# Patient Record
Sex: Female | Born: 1955 | Race: White | Hispanic: No | Marital: Single | Smoking: Former smoker
Health system: Southern US, Community
[De-identification: ages and names within clinical notes are randomized; demographics above are authoritative.]

## PROBLEM LIST (undated history)

## (undated) DIAGNOSIS — K648 Other hemorrhoids: Secondary | ICD-10-CM

## (undated) DIAGNOSIS — F329 Major depressive disorder, single episode, unspecified: Secondary | ICD-10-CM

## (undated) DIAGNOSIS — M1990 Unspecified osteoarthritis, unspecified site: Secondary | ICD-10-CM

## (undated) DIAGNOSIS — R112 Nausea with vomiting, unspecified: Secondary | ICD-10-CM

## (undated) DIAGNOSIS — E039 Hypothyroidism, unspecified: Secondary | ICD-10-CM

## (undated) DIAGNOSIS — G479 Sleep disorder, unspecified: Secondary | ICD-10-CM

## (undated) DIAGNOSIS — IMO0001 Reserved for inherently not codable concepts without codable children: Secondary | ICD-10-CM

## (undated) DIAGNOSIS — E669 Obesity, unspecified: Secondary | ICD-10-CM

## (undated) DIAGNOSIS — I10 Essential (primary) hypertension: Secondary | ICD-10-CM

## (undated) DIAGNOSIS — K635 Polyp of colon: Secondary | ICD-10-CM

## (undated) DIAGNOSIS — F32A Depression, unspecified: Secondary | ICD-10-CM

## (undated) DIAGNOSIS — E559 Vitamin D deficiency, unspecified: Secondary | ICD-10-CM

## (undated) DIAGNOSIS — Z9889 Other specified postprocedural states: Secondary | ICD-10-CM

## (undated) HISTORY — DX: Hypothyroidism, unspecified: E03.9

## (undated) HISTORY — DX: Sleep disorder, unspecified: G47.9

## (undated) HISTORY — DX: Essential (primary) hypertension: I10

## (undated) HISTORY — PX: OTHER SURGICAL HISTORY: SHX169

## (undated) HISTORY — DX: Polyp of colon: K63.5

## (undated) HISTORY — DX: Obesity, unspecified: E66.9

## (undated) HISTORY — DX: Vitamin D deficiency, unspecified: E55.9

## (undated) HISTORY — DX: Reserved for inherently not codable concepts without codable children: IMO0001

## (undated) HISTORY — DX: Other hemorrhoids: K64.8

---

## 1995-07-20 DIAGNOSIS — L732 Hidradenitis suppurativa: Secondary | ICD-10-CM

## 1995-07-20 HISTORY — DX: Hidradenitis suppurativa: L73.2

## 1996-11-19 HISTORY — PX: ABDOMINAL HYSTERECTOMY: SHX81

## 1996-11-19 HISTORY — PX: TOTAL VAGINAL HYSTERECTOMY: SHX2548

## 1999-03-26 ENCOUNTER — Encounter: Admission: RE | Admit: 1999-03-26 | Discharge: 1999-03-26 | Payer: Self-pay | Admitting: Family Medicine

## 1999-03-26 ENCOUNTER — Encounter: Payer: Self-pay | Admitting: Family Medicine

## 2000-09-02 ENCOUNTER — Encounter: Payer: Self-pay | Admitting: Family Medicine

## 2000-09-02 ENCOUNTER — Encounter: Admission: RE | Admit: 2000-09-02 | Discharge: 2000-09-02 | Payer: Self-pay | Admitting: Family Medicine

## 2000-10-05 ENCOUNTER — Other Ambulatory Visit: Admission: RE | Admit: 2000-10-05 | Discharge: 2000-10-05 | Payer: Self-pay | Admitting: Obstetrics and Gynecology

## 2001-09-06 ENCOUNTER — Encounter: Admission: RE | Admit: 2001-09-06 | Discharge: 2001-09-06 | Payer: Self-pay | Admitting: Family Medicine

## 2001-09-06 ENCOUNTER — Encounter: Payer: Self-pay | Admitting: Family Medicine

## 2002-03-20 DIAGNOSIS — K648 Other hemorrhoids: Secondary | ICD-10-CM

## 2002-03-20 HISTORY — DX: Other hemorrhoids: K64.8

## 2003-01-23 ENCOUNTER — Encounter: Admission: RE | Admit: 2003-01-23 | Discharge: 2003-01-23 | Payer: Self-pay | Admitting: Family Medicine

## 2003-10-29 ENCOUNTER — Encounter: Admission: RE | Admit: 2003-10-29 | Discharge: 2003-10-29 | Payer: Self-pay | Admitting: Family Medicine

## 2004-04-08 ENCOUNTER — Encounter: Admission: RE | Admit: 2004-04-08 | Discharge: 2004-04-08 | Payer: Self-pay | Admitting: Family Medicine

## 2004-05-13 ENCOUNTER — Other Ambulatory Visit: Admission: RE | Admit: 2004-05-13 | Discharge: 2004-05-13 | Payer: Self-pay | Admitting: Family Medicine

## 2004-05-21 ENCOUNTER — Encounter: Admission: RE | Admit: 2004-05-21 | Discharge: 2004-05-21 | Payer: Self-pay | Admitting: Family Medicine

## 2004-06-04 DIAGNOSIS — G479 Sleep disorder, unspecified: Secondary | ICD-10-CM

## 2004-06-04 HISTORY — DX: Sleep disorder, unspecified: G47.9

## 2004-08-19 DIAGNOSIS — IMO0001 Reserved for inherently not codable concepts without codable children: Secondary | ICD-10-CM

## 2004-08-19 HISTORY — DX: Reserved for inherently not codable concepts without codable children: IMO0001

## 2005-05-13 ENCOUNTER — Encounter: Admission: RE | Admit: 2005-05-13 | Discharge: 2005-05-13 | Payer: Self-pay | Admitting: Family Medicine

## 2005-08-04 ENCOUNTER — Ambulatory Visit: Payer: Self-pay | Admitting: Family Medicine

## 2005-09-29 ENCOUNTER — Ambulatory Visit: Payer: Self-pay | Admitting: Family Medicine

## 2005-11-25 ENCOUNTER — Ambulatory Visit: Payer: Self-pay | Admitting: Family Medicine

## 2006-06-01 ENCOUNTER — Encounter: Admission: RE | Admit: 2006-06-01 | Discharge: 2006-06-01 | Payer: Self-pay | Admitting: Family Medicine

## 2006-06-02 ENCOUNTER — Ambulatory Visit: Payer: Self-pay | Admitting: Family Medicine

## 2007-05-03 ENCOUNTER — Other Ambulatory Visit: Admission: RE | Admit: 2007-05-03 | Discharge: 2007-05-03 | Payer: Self-pay | Admitting: Family Medicine

## 2007-05-03 ENCOUNTER — Ambulatory Visit: Payer: Self-pay | Admitting: Family Medicine

## 2007-06-02 ENCOUNTER — Encounter: Admission: RE | Admit: 2007-06-02 | Discharge: 2007-06-02 | Payer: Self-pay | Admitting: Family Medicine

## 2007-06-03 ENCOUNTER — Encounter: Admission: RE | Admit: 2007-06-03 | Discharge: 2007-06-03 | Payer: Self-pay | Admitting: Gastroenterology

## 2007-06-20 DIAGNOSIS — K635 Polyp of colon: Secondary | ICD-10-CM

## 2007-06-20 HISTORY — DX: Polyp of colon: K63.5

## 2007-07-08 LAB — HM COLONOSCOPY

## 2007-12-22 ENCOUNTER — Ambulatory Visit: Payer: Self-pay | Admitting: Family Medicine

## 2008-01-07 LAB — HM COLONOSCOPY: HM Colonoscopy: NORMAL

## 2008-06-28 ENCOUNTER — Ambulatory Visit: Payer: Self-pay | Admitting: Family Medicine

## 2008-08-06 ENCOUNTER — Encounter: Admission: RE | Admit: 2008-08-06 | Discharge: 2008-08-06 | Payer: Self-pay | Admitting: Family Medicine

## 2008-09-19 ENCOUNTER — Ambulatory Visit: Payer: Self-pay | Admitting: Family Medicine

## 2008-10-15 ENCOUNTER — Ambulatory Visit: Payer: Self-pay | Admitting: Family Medicine

## 2009-07-01 ENCOUNTER — Ambulatory Visit: Payer: Self-pay | Admitting: Physician Assistant

## 2010-07-13 ENCOUNTER — Other Ambulatory Visit: Payer: Self-pay | Admitting: Family Medicine

## 2010-07-14 ENCOUNTER — Telehealth: Payer: Self-pay | Admitting: *Deleted

## 2010-07-14 NOTE — Telephone Encounter (Signed)
Received rf request for lisinopril 10/12.5. Called in #30 and left message for patient that she must schedule appt for either CPE or med check in the next 30 days as she has not been seen since 07/01/2009.

## 2010-07-21 ENCOUNTER — Other Ambulatory Visit: Payer: Self-pay | Admitting: Family Medicine

## 2010-07-21 MED ORDER — BUPROPION HCL ER (XL) 300 MG PO TB24: 300.0000 mg | ORAL_TABLET | ORAL | Status: DC

## 2010-07-25 ENCOUNTER — Telehealth: Payer: Self-pay | Admitting: Family Medicine

## 2010-07-25 MED ORDER — SYNTHROID 75 MCG PO TABS: 75.0000 ug | ORAL_TABLET | Freq: Every day | ORAL | Status: DC

## 2010-07-25 NOTE — Telephone Encounter (Signed)
Pt made cpe on 09/11/10 which is first available.  Pt needs Synthroid .075 mcg 1 qd renewed to [facility]   [address]

## 2010-08-19 ENCOUNTER — Other Ambulatory Visit: Payer: Self-pay | Admitting: Family Medicine

## 2010-09-08 ENCOUNTER — Encounter: Payer: Self-pay | Admitting: Family Medicine

## 2010-09-09 ENCOUNTER — Other Ambulatory Visit: Payer: Self-pay | Admitting: Family Medicine

## 2010-09-09 DIAGNOSIS — Z1231 Encounter for screening mammogram for malignant neoplasm of breast: Secondary | ICD-10-CM

## 2010-09-11 ENCOUNTER — Ambulatory Visit (INDEPENDENT_AMBULATORY_CARE_PROVIDER_SITE_OTHER): Payer: BC Managed Care – PPO | Admitting: Family Medicine

## 2010-09-11 ENCOUNTER — Encounter: Payer: Self-pay | Admitting: Family Medicine

## 2010-09-11 VITALS — BP 106/68 | HR 68 | Ht 66.0 in | Wt 202.0 lb

## 2010-09-11 DIAGNOSIS — F325 Major depressive disorder, single episode, in full remission: Secondary | ICD-10-CM | POA: Insufficient documentation

## 2010-09-11 DIAGNOSIS — F329 Major depressive disorder, single episode, unspecified: Secondary | ICD-10-CM

## 2010-09-11 DIAGNOSIS — E039 Hypothyroidism, unspecified: Secondary | ICD-10-CM | POA: Insufficient documentation

## 2010-09-11 DIAGNOSIS — Z0000 Encounter for general adult medical examination without abnormal findings: Secondary | ICD-10-CM

## 2010-09-11 DIAGNOSIS — E559 Vitamin D deficiency, unspecified: Secondary | ICD-10-CM | POA: Insufficient documentation

## 2010-09-11 LAB — POCT URINALYSIS DIPSTICK
Bilirubin, UA: NEGATIVE
Blood, UA: NEGATIVE
Glucose, UA: NEGATIVE
Ketones, UA: NEGATIVE
Leukocytes, UA: NEGATIVE
Protein, UA: NEGATIVE
Urobilinogen, UA: NEGATIVE
pH, UA: 5

## 2010-09-11 LAB — GLUCOSE, RANDOM: Glucose, Bld: 78 mg/dL (ref 70–99)

## 2010-09-11 MED ORDER — BUPROPION HCL ER (XL) 150 MG PO TB24: 150.0000 mg | ORAL_TABLET | ORAL | Status: DC

## 2010-09-11 NOTE — Progress Notes (Signed)
[name] is a 55 y.o. female who presents for a complete physical.  She has the following concerns: Sometimes, when she leans forward, mouth fills with saliva. No indigestion, heartburn, nausea, or other associated problems.  Intermittent  Immunization History  Administered Date(s) Administered  . Tdap 09/19/2008  declines flu shots Last Pap smear: 2009; s/p hysterectomy (for benign bleeding) Last mammogram: scheduled for next week Last colonoscopy: 6/09 Dr. Loreta Ave Last DEXA: never Dentist: 2 years ago Ophtho:  Yearly Exercise: yardwork  Past Medical History  Diagnosis Date  . Unspecified hypothyroidism   . Obesity   . Menopause 2002  . Hyperlipidemia 04/2004  . Hemorrhoids, internal 03/2002  . Colon polyp 6/09    Dr. Loreta Ave (no path report in chart)    Past Surgical History  Procedure Date  . Abdominal hysterectomy 11/1996    1 ovary remains  . Cesarean section 04/08/81-02/02/90    X4  . Arthroscopic knee 08/18/10 Dr.Beane    right knee    History   Social History  . Marital Status: Divorced    Spouse Name: N/A    Number of Children: 4  . Years of Education: N/A   Occupational History  . [name], Customer service and sales rep    Social History Main Topics  . Smoking status: Former Smoker    Quit date: 01/20/1996  . Smokeless tobacco: Not on file  . Alcohol Use: Yes     1 drink twice a year.  . Drug Use: No  . Sexually Active: Not Currently   Other Topics Concern  . Not on file   Social History Narrative   Lives with 2 youngest children    Family History  Problem Relation Age of Onset  . Arthritis Father   . Hypertension Father   . Cancer Father     lung cancer  . Hypertension Mother     Current outpatient prescriptions:aspirin 81 MG tablet, Take 81 mg by mouth daily.  , Disp: , Rfl: ;  buPROPion (WELLBUTRIN XL) 150 MG 24 hr tablet, Take 1 tablet (150 mg total) by mouth every morning., Disp: 30 tablet, Rfl: 11;  HYDROcodone-acetaminophen  (NORCO) 7.5-325 MG per tablet, Take 1 tablet by mouth as needed.  , Disp: , Rfl: ;  ibuprofen (ADVIL,MOTRIN) 200 MG tablet, Take 800 mg by mouth as needed.  , Disp: , Rfl:  lisinopril-hydrochlorothiazide (PRINZIDE,ZESTORETIC) 10-12.5 MG per tablet, TAKE 1 TABLET EVERY DAY, Disp: 30 tablet, Rfl: 0;  methocarbamol (ROBAXIN) 750 MG tablet, Take 750 mg by mouth as needed.  , Disp: , Rfl: ;  SYNTHROID 75 MCG tablet, Take 1 tablet (75 mcg total) by mouth daily., Disp: 30 tablet, Rfl: 1  Allergies  Allergen Reactions  . Zinc Hives   ROS: The patient denies anorexia, fever, weight changes, headaches,  vision changes, decreased hearing, ear pain, sore throat, breast concerns, chest pain, palpitations, dizziness, syncope, dyspnea on exertion, cough, swelling, nausea, vomiting, diarrhea, constipation, abdominal pain, melena, hematochezia, indigestion/heartburn, hematuria, incontinence, dysuria, vaginal bleeding, discharge, odor or itch, genital lesions, tingling, weakness, tremor, suspicious skin lesions, depression, anxiety, abnormal bleeding/bruising, or enlarged lymph nodes.  Occasional numbness in fingers when she's doing her hair, intermittent.  Denies weakness.  Recent R knee surgery, pain from surgery.  On antidepressants since divorce 2-3 years ago.  PHYSICAL EXAM: BP 106/68  Pulse 68  Ht 5\' 6"  (1.676 m)  Wt 202 lb (91.627 kg)  BMI 32.60 kg/m2  General Appearance:    Alert, cooperative,  no distress, appears stated age  Head:    Normocephalic, without obvious abnormality, atraumatic  Eyes:    PERRL, conjunctiva/corneas clear, EOM's intact, fundi    benign  Ears:    Normal TM's and external ear canals  Nose:   Nares normal, mucosa normal, no drainage or sinus   tenderness  Throat:   Lips, mucosa, and tongue normal; teeth and gums normal  Neck:   Supple, no lymphadenopathy;  thyroid:  no   enlargement/tenderness/nodules; no carotid   bruit or JVD  Back:    Spine nontender, no curvature, ROM  normal, no CVA     tenderness  Lungs:     Clear to auscultation bilaterally without wheezes, rales or     ronchi; respirations unlabored  Chest Wall:    No tenderness or deformity   Heart:    Regular rate and rhythm, S1 and S2 normal, no murmur, rub   or gallop  Breast Exam:    No tenderness, masses, or nipple discharge or inversion.      No axillary lymphadenopathy  Abdomen:     Soft, non-tender, nondistended, normoactive bowel sounds,    no masses, no hepatosplenomegaly  Genitalia:    Normal external genitalia without lesions.  BUS and vagina normal; s/p hysterectomy. Normal bimanual exam--absent uterus.  Adnexa not palpable, no masses  Rectal:    Normal tone, no masses or tenderness; guaiac negative stool  Extremities:   No clubbing, cyanosis or edema  Pulses:   2+ and symmetric all extremities  Skin:   Skin color, texture, turgor normal, no rashes or lesions  Lymph nodes:   Cervical, supraclavicular, and axillary nodes normal  Neurologic:   CNII-XII intact, normal strength, sensation and gait; reflexes 2+ and symmetric throughout          Psych:   Normal mood, affect, hygiene and grooming.    ASSESSMENT/PLAN: 1. Routine general medical examination at a health care facility  POCT Urinalysis Dipstick, Visual acuity screening, Glucose, random  2. Depressive disorder, not elsewhere classified  buPROPion (WELLBUTRIN XL) 150 MG 24 hr tablet   Recommended trial decrease to 150mg , and if doing well in 1 month, consider discontinuing entirely.  Re-start (or increase back to 300mg ) if recurrent sx  3. Unspecified vitamin D deficiency  Vitamin D 25 hydroxy  4. Unspecified hypothyroidism  TSH

## 2010-09-11 NOTE — Patient Instructions (Signed)
Please call if you start having recurrent depressive symptoms, and we will change your Wellbutrin prescription back to 300mg   HEALTH MAINTENANCE RECOMMENDATIONS:  It is recommended that you get at least 30 minutes of aerobic exercise at least 5 days/week (for weight loss, you may need as much as 60-90 minutes). This can be any activity that gets your heart rate up. This can be divided in 10-15 minute intervals if needed, but try and build up your endurance at least once a week.  Weight bearing exercise is also recommended twice weekly.  Eat a healthy diet with lots of vegetables, fruits and fiber.  "Colorful" foods have a lot of vitamins (ie green vegetables, tomatoes, red peppers, etc).  Limit sweet tea, regular sodas and alcoholic beverages, all of which has a lot of calories and sugar.  Up to 1 alcoholic drink daily may be beneficial for women (unless trying to lose weight, watch sugars).  Drink a lot of water.  Calcium recommendations are 1200-1500 mg daily (1500 mg for postmenopausal women or women without ovaries), and vitamin D 1000 IU daily.  This should be obtained from diet and/or supplements (vitamins), and calcium should not be taken all at once, but in divided doses.  Monthly self breast exams and yearly mammograms for women over the age of 39 is recommended.  Sunscreen of at least SPF 30 should be used on all sun-exposed parts of the skin when outside between the hours of 10 am and 4 pm (not just when at beach or pool, but even with exercise, golf, tennis, and yard work!)  Use a sunscreen that says "broad spectrum" so it covers both UVA and UVB rays, and make sure to reapply every 1-2 hours.  Remember to change the batteries in your smoke detectors when changing your clock times in the spring and fall.  Use your seat belt every time you are in a car, and please drive safely and not be distracted with cell phones and texting while driving.  Annual flu shots are recommended for people  over 50

## 2010-09-12 ENCOUNTER — Encounter: Payer: Self-pay | Admitting: Family Medicine

## 2010-09-17 ENCOUNTER — Ambulatory Visit
Admission: RE | Admit: 2010-09-17 | Discharge: 2010-09-17 | Disposition: A | Discharge status: Home / Self Care | Payer: BC Managed Care – PPO | Source: Ambulatory Visit | Attending: Family Medicine | Admitting: Family Medicine

## 2010-09-17 DIAGNOSIS — Z1231 Encounter for screening mammogram for malignant neoplasm of breast: Secondary | ICD-10-CM

## 2010-09-18 ENCOUNTER — Other Ambulatory Visit: Payer: Self-pay | Admitting: Family Medicine

## 2010-10-17 ENCOUNTER — Other Ambulatory Visit: Payer: Self-pay | Admitting: Family Medicine

## 2010-10-20 ENCOUNTER — Ambulatory Visit (HOSPITAL_BASED_OUTPATIENT_CLINIC_OR_DEPARTMENT_OTHER)
Admission: RE | Admit: 2010-10-20 | Discharge: 2010-10-20 | Disposition: A | Discharge status: Home / Self Care | Payer: BC Managed Care – PPO | Source: Ambulatory Visit | Attending: Specialist | Admitting: Specialist

## 2010-10-20 DIAGNOSIS — X58XXXA Exposure to other specified factors, initial encounter: Secondary | ICD-10-CM | POA: Insufficient documentation

## 2010-10-20 DIAGNOSIS — I10 Essential (primary) hypertension: Secondary | ICD-10-CM | POA: Insufficient documentation

## 2010-10-20 DIAGNOSIS — M2240 Chondromalacia patellae, unspecified knee: Secondary | ICD-10-CM | POA: Insufficient documentation

## 2010-10-20 DIAGNOSIS — R9431 Abnormal electrocardiogram [ECG] [EKG]: Secondary | ICD-10-CM | POA: Insufficient documentation

## 2010-10-20 DIAGNOSIS — M2340 Loose body in knee, unspecified knee: Secondary | ICD-10-CM | POA: Insufficient documentation

## 2010-10-20 DIAGNOSIS — IMO0002 Reserved for concepts with insufficient information to code with codable children: Secondary | ICD-10-CM | POA: Insufficient documentation

## 2010-10-20 DIAGNOSIS — Z79899 Other long term (current) drug therapy: Secondary | ICD-10-CM | POA: Insufficient documentation

## 2010-10-20 HISTORY — PX: OTHER SURGICAL HISTORY: SHX169

## 2010-10-20 LAB — POCT I-STAT, CHEM 8
BUN: 12 mg/dL (ref 6–23)
Calcium, Ion: 1.23 mmol/L (ref 1.12–1.32)
Chloride: 102 mEq/L (ref 96–112)
Creatinine, Ser: 0.9 mg/dL (ref 0.50–1.10)
Glucose, Bld: 95 mg/dL (ref 70–99)
HCT: 42 % (ref 36.0–46.0)
Hemoglobin: 14.3 g/dL (ref 12.0–15.0)
Potassium: 3.8 mEq/L (ref 3.5–5.1)
Sodium: 138 mEq/L (ref 135–145)
TCO2: 23 mmol/L (ref 0–100)

## 2010-10-22 NOTE — Op Note (Signed)
[name], [name]                  ACCOUNT NO.:  [id number]  MEDICAL RECORD NO.:  [id number]  LOCATION:                               FACILITY:  [facility]  PHYSICIAN:  Jene Every, M.D.    DATE OF BIRTH:  February 06, 1955  DATE OF PROCEDURE:  10/20/2010 DATE OF DISCHARGE:                              OPERATIVE REPORT   PREOPERATIVE DIAGNOSIS:  Medial meniscus tear of the left knee.  POSTOPERATIVE DIAGNOSES: 1. Medial meniscus tear of the left knee. 2. Grade 3 chondromalacia, medial femoral condyle, medial tibial     plateau, lateral femoral condyle, and patellofemoral joint. 3. Cartilaginous loose bodies.  PROCEDURE PERFORMED: 1. Left knee arthroscopy. 2. Partial medial meniscectomy. 3. Chondroplasty, medial femoral condyle, medial tibial plateau     patella, patellofemoral joint, lateral tibial plateau. 4. Evacuation of loose bodies with pituitary rongeur and lavage.  ANESTHESIA:  General.  ASSISTANT:  None.  BRIEF HISTORY:  55 year old with knee pain, locking, giving way.  MRI indicating meniscus tear medially and locking.  Also, degenerative changes indicated for arthroscopic debridement.  Risk and benefits discussed, including bleeding, infection, no change in symptoms, worsening symptoms, need for repeat debridement, DVT, PE, anesthetic complications, etc.  TECHNIQUE:  Patient in supine position after induction of adequate general anesthesia and 2 g of Kefzol, left lower extremity was prepped and draped in the usual sterile fashion.  A lateral parapatellar portal and superomedial parapatellar portal was fashioned with #11 blade and ingress cannula atraumatically placed.  Irrigant was utilized to insufflate the joint.  Under direct visualization, a medial parapatellar portal was fashioned with a #11 blade after localization with an 18 gauge needle sparing the medial meniscus.  There was extensive tearing of the medial meniscus.  It was extending the posterior half.  I  used the upbiting basket rongeur and performed a partial medial meniscectomy to a stable base, further contouring with a 4.2 Cuda shaver.  Remnant was stable to probe palpation, approximately 1/3rd of the posterior half was excised.  Loose cartilaginous debris was noted in the medial compartment secondary to extensive grade 3 and near grade 4 changes of femoral condyle.  We evacuated loose bodies with a shaver as well as the pituitary.  Performed a light chondroplasty of the femoral condyle and the tibial plateau.  The region was 2 x 2 cm.  Next, we examined the ACL that was unremarkable.  Lateral compartment of the extensive grade 3 changes of tibial plateau, grade 2 changes of femoral condyle, degenerative fraying of the meniscus, performed a light chondroplasty of the lateral tibial plateau, shaved the meniscus.  No loose bodies noted here.  The remnant was stable to probe palpation.  Suprapatellar pouch revealed some grade 3 changes of patellofemoral joint.  Light chondroplasty performed here.  There is normal patellofemoral tracking.  Gutters were unremarkable.  I reexamined all compartments.  No further pathology, amenable arthroscopic intervention, therefore I removed all instrumentation. Portals were closed with 4-0 nylon simple sutures, 0.25% Marcaine with epinephrine was infiltrated in the joint.  Wound was dressed sterilely. [place] without difficulty and transported to recovery room in satisfactory condition.  Patient tolerated the procedure.  No  complications.     Jene Every, M.D.     Cordelia Pen  D:  10/20/2010  T:  10/21/2010  Job:  [id number]  Electronically Signed by Jene Every M.D. on 10/22/2010 09:00:04 AM

## 2010-11-20 DIAGNOSIS — E559 Vitamin D deficiency, unspecified: Secondary | ICD-10-CM

## 2010-11-20 HISTORY — DX: Vitamin D deficiency, unspecified: E55.9

## 2010-11-27 ENCOUNTER — Other Ambulatory Visit: Payer: BC Managed Care – PPO

## 2010-11-27 DIAGNOSIS — E785 Hyperlipidemia, unspecified: Secondary | ICD-10-CM

## 2010-11-27 DIAGNOSIS — E039 Hypothyroidism, unspecified: Secondary | ICD-10-CM

## 2010-11-27 DIAGNOSIS — E559 Vitamin D deficiency, unspecified: Secondary | ICD-10-CM

## 2010-11-27 LAB — COMPREHENSIVE METABOLIC PANEL
ALT: 11 U/L (ref 0–35)
Alkaline Phosphatase: 85 U/L (ref 39–117)
CO2: 25 mEq/L (ref 19–32)
Chloride: 104 mEq/L (ref 96–112)
Glucose, Bld: 89 mg/dL (ref 70–99)
Potassium: 4.4 mEq/L (ref 3.5–5.3)
Total Protein: 6.9 g/dL (ref 6.0–8.3)

## 2010-11-27 LAB — TSH: TSH: 1.743 u[IU]/mL (ref 0.350–4.500)

## 2010-11-27 LAB — LIPID PANEL
HDL: 53 mg/dL (ref >39)
Total CHOL/HDL Ratio: 3.9 Ratio
VLDL: 35 mg/dL (ref 0–40)

## 2010-12-22 ENCOUNTER — Other Ambulatory Visit: Payer: Self-pay | Admitting: Family Medicine

## 2010-12-23 ENCOUNTER — Other Ambulatory Visit: Payer: Self-pay | Admitting: Medical

## 2010-12-23 ENCOUNTER — Telehealth: Payer: Self-pay | Admitting: Family Medicine

## 2010-12-23 MED ORDER — LISINOPRIL-HYDROCHLOROTHIAZIDE 10-12.5 MG PO TABS: 1.0000 | ORAL_TABLET | Freq: Every day | ORAL | Status: DC

## 2010-12-23 NOTE — Telephone Encounter (Signed)
LMOM NOTIFYING THE PATIENT THAT HER RX WAS SENT TO THE PHARMACY. CLS

## 2010-12-23 NOTE — Telephone Encounter (Signed)
rx sent

## 2011-02-18 ENCOUNTER — Other Ambulatory Visit: Payer: Self-pay | Admitting: *Deleted

## 2011-02-18 ENCOUNTER — Other Ambulatory Visit: Payer: Self-pay | Admitting: Family Medicine

## 2011-02-18 DIAGNOSIS — E039 Hypothyroidism, unspecified: Secondary | ICD-10-CM

## 2011-02-18 MED ORDER — SYNTHROID 75 MCG PO TABS: 75.0000 ug | ORAL_TABLET | Freq: Every day | ORAL | Status: DC

## 2011-02-18 NOTE — Telephone Encounter (Signed)
Is this ok?

## 2011-03-24 ENCOUNTER — Other Ambulatory Visit: Payer: Self-pay | Admitting: Family Medicine

## 2011-03-24 NOTE — Telephone Encounter (Signed)
PATIENT NEEDS TO SCHEDULE AN OV.

## 2011-04-22 ENCOUNTER — Other Ambulatory Visit: Payer: Self-pay | Admitting: Family Medicine

## 2011-06-18 ENCOUNTER — Other Ambulatory Visit: Payer: Self-pay | Admitting: Family Medicine

## 2011-08-18 ENCOUNTER — Other Ambulatory Visit: Payer: Self-pay | Admitting: Family Medicine

## 2011-08-19 NOTE — Telephone Encounter (Signed)
Rx refill on Wellbutrin.

## 2011-08-19 NOTE — Telephone Encounter (Signed)
Called patient and let her know that she is due for CPE after 09/11/11-she needs to call and schedule- I have given her 30 days worth of her meds.

## 2011-08-20 NOTE — Telephone Encounter (Signed)
She needs a followup appointment. Give her enough to cover her until she can be seen

## 2011-09-19 ENCOUNTER — Other Ambulatory Visit: Payer: Self-pay | Admitting: Family Medicine

## 2011-09-22 NOTE — Telephone Encounter (Signed)
Don't r but she needs a followup appointment

## 2011-09-22 NOTE — Telephone Encounter (Signed)
IS THIS OK 

## 2011-10-05 ENCOUNTER — Encounter: Payer: Self-pay | Admitting: Internal Medicine

## 2011-10-12 ENCOUNTER — Ambulatory Visit (INDEPENDENT_AMBULATORY_CARE_PROVIDER_SITE_OTHER): Payer: BC Managed Care – PPO | Admitting: Medical

## 2011-10-12 ENCOUNTER — Encounter: Payer: Self-pay | Admitting: Medical

## 2011-10-12 VITALS — BP 102/70 | HR 72 | Temp 98.4°F | Resp 16 | Ht 66.0 in | Wt 199.5 lb

## 2011-10-12 DIAGNOSIS — E559 Vitamin D deficiency, unspecified: Secondary | ICD-10-CM

## 2011-10-12 DIAGNOSIS — E785 Hyperlipidemia, unspecified: Secondary | ICD-10-CM

## 2011-10-12 DIAGNOSIS — E039 Hypothyroidism, unspecified: Secondary | ICD-10-CM

## 2011-10-12 DIAGNOSIS — M7710 Lateral epicondylitis, unspecified elbow: Secondary | ICD-10-CM

## 2011-10-12 DIAGNOSIS — Z0000 Encounter for general adult medical examination without abnormal findings: Secondary | ICD-10-CM

## 2011-10-12 DIAGNOSIS — Z23 Encounter for immunization: Secondary | ICD-10-CM

## 2011-10-12 DIAGNOSIS — R21 Rash and other nonspecific skin eruption: Secondary | ICD-10-CM

## 2011-10-12 DIAGNOSIS — I10 Essential (primary) hypertension: Secondary | ICD-10-CM

## 2011-10-12 LAB — CBC
HCT: 35.3 % — ABNORMAL LOW (ref 36.0–46.0)
MCH: 30.6 pg (ref 26.0–34.0)
MCHC: 35.4 g/dL (ref 30.0–36.0)
MCV: 86.5 fL (ref 78.0–100.0)
RDW: 13.5 % (ref 11.5–15.5)

## 2011-10-12 LAB — COMPREHENSIVE METABOLIC PANEL
ALT: 17 U/L (ref 0–35)
Alkaline Phosphatase: 80 U/L (ref 39–117)
CO2: 27 mEq/L (ref 19–32)
Creat: 0.75 mg/dL (ref 0.50–1.10)
Sodium: 138 mEq/L (ref 135–145)
Total Bilirubin: 0.4 mg/dL (ref 0.3–1.2)
Total Protein: 7.1 g/dL (ref 6.0–8.3)

## 2011-10-12 LAB — POCT URINALYSIS DIPSTICK
Glucose, UA: NEGATIVE
Nitrite, UA: NEGATIVE
Protein, UA: NEGATIVE
Urobilinogen, UA: NEGATIVE

## 2011-10-12 LAB — LIPID PANEL: LDL Cholesterol: 99 mg/dL (ref 0–99)

## 2011-10-12 MED ORDER — DESONIDE 0.05 % EX CREA: TOPICAL_CREAM | Freq: Two times a day (BID) | CUTANEOUS | Status: DC

## 2011-10-12 MED ORDER — LISINOPRIL-HYDROCHLOROTHIAZIDE 10-12.5 MG PO TABS: 1.0000 | ORAL_TABLET | Freq: Every day | ORAL | Status: DC

## 2011-10-12 MED ORDER — SYNTHROID 75 MCG PO TABS: 75.0000 ug | ORAL_TABLET | Freq: Every day | ORAL | Status: DC

## 2011-10-12 MED ORDER — BUPROPION HCL ER (XL) 300 MG PO TB24: 300.0000 mg | ORAL_TABLET | ORAL | Status: DC

## 2011-10-12 NOTE — Progress Notes (Signed)
[name] is a 56 y.o. female presents for a complete physical, needs biometric form completed for work.  Been in normal state of health.  Last Pap smear: 2009; s/p hysterectomy (for benign bleeding) Last mammogram: scheduled for this month Last colonoscopy: 6/09 Dr. Loreta Ave Last DEXA: never Dentist: yearly Ophtho:  Yearly Exercise: yardwork  She has 2 c/o.  She notes intermittent  Hx/o dermatitis in both armpits, was prescribed desonide cream in the past by dermatology, would like refill on this for periodic use.  She notes pain in her right elbow region.  Was lifting boxes a week or so ago and later that day started getting pain over right arm just above and below elbow on the outside.  This continues to bother her.  Here for recheck on chronic issues - hypothyroidism, hypertension, hyperlipidemia. She is compliant with medication for thyroid and BP.   Past Medical History  Diagnosis Date  . Unspecified hypothyroidism   . Obesity   . Menopause 2002  . Hyperlipidemia 04/2004  . Hemorrhoids, internal 03/2002  . Colon polyp 6/09    Dr. Loreta Ave (no path report in chart)  . Hypertension   . History of mammogram 08/2010    normal screening  . Vitamin d deficiency 11/2010  . Normal cardiac stress test 08/19/04    normal 2D echo and cardiolite perfusion study; Dr. Aleen Campi  . Sleep disturbance 06/04/2004    normal sleep study     Past Surgical History  Procedure Date  . Cesarean section 04/08/81-02/02/90    X4  . Arthroscopic knee 08/18/10 Dr.Beane    right knee   . Arthroscopic knee 10/2010    left; Dr. Shelle Iron  . Abdominal hysterectomy 11/1996    1 ovary remains; due to dysmenorrhea    History   Social History  . Marital Status: Divorced    Spouse Name: N/A    Number of Children: 4  . Years of Education: N/A   Occupational History  . [employer], Customer service and sales rep    Social History Main Topics  . Smoking status: Former Smoker    Quit date: 01/20/1996    . Smokeless tobacco: Not on file  . Alcohol Use: Yes     1 drink twice a year.  . Drug Use: No  . Sexually Active: Not Currently   Other Topics Concern  . Not on file   Social History Narrative   Lives with 2 youngest children; divorced; exercise - yardwork, mowing    Family History  Problem Relation Age of Onset  . Arthritis Father   . Hypertension Father   . Cancer Father     lung cancer  . Hypertension Mother   . Diabetes Neg Hx   . Heart disease Neg Hx   . Stroke Neg Hx     Current outpatient prescriptions:aspirin 81 MG tablet, Take 81 mg by mouth daily.  , Disp: , Rfl: ;  buPROPion (WELLBUTRIN XL) 300 MG 24 hr tablet, TAKE 1 TABLET BY MOUTH EVERY MORNING, Disp: 30 tablet, Rfl: 0;  HYDROcodone-acetaminophen (NORCO) 7.5-325 MG per tablet, Take 1 tablet by mouth as needed.  , Disp: , Rfl: ;  ibuprofen (ADVIL,MOTRIN) 200 MG tablet, Take 800 mg by mouth as needed.  , Disp: , Rfl:  lisinopril-hydrochlorothiazide (PRINZIDE,ZESTORETIC) 10-12.5 MG per tablet, Take 1 tablet by mouth daily., Disp: 30 tablet, Rfl: 2;  methocarbamol (ROBAXIN) 750 MG tablet, Take 750 mg by mouth as needed.  , Disp: , Rfl: ;  SYNTHROID 75 MCG tablet, TAKE 1 TABLET EVERY DAY, Disp: 30 tablet, Rfl: 0;  DISCONTD: buPROPion (WELLBUTRIN XL) 150 MG 24 hr tablet, Take 1 tablet (150 mg total) by mouth every morning., Disp: 30 tablet, Rfl: 11 DISCONTD: lisinopril-hydrochlorothiazide (PRINZIDE,ZESTORETIC) 10-12.5 MG per tablet, TAKE 1 TABLET BY MOUTH DAILY, Disp: 30 tablet, Rfl: 0;  DISCONTD: lisinopril-hydrochlorothiazide (PRINZIDE,ZESTORETIC) 10-12.5 MG per tablet, TAKE 1 TABLET BY MOUTH DAILY, Disp: 30 tablet, Rfl: 0;  DISCONTD: SYNTHROID 75 MCG tablet, TAKE 1 TABLET EVERY DAY, Disp: 30 tablet, Rfl: 0  Allergies  Allergen Reactions  . Zinc Hives     Review of Systems Constitutional: denies fever, chills, sweats, unexpected weight change, anorexia, fatigue Allergy: negative; denies recent sneezing, itching,  congestion Dermatology: denies changing moles, lumps, new worrisome lesions ENT: no runny nose, ear pain, sore throat, hoarseness, sinus pain, teeth pain, tinnitus, hearing loss, epistaxis Cardiology: denies chest pain, palpitations, edema, orthopnea, paroxysmal nocturnal dyspnea Respiratory: denies cough, shortness of breath, dyspnea on exertion, wheezing, hemoptysis Gastroenterology: denies abdominal pain, nausea, vomiting, diarrhea, constipation, blood in stool, changes in bowel movement, dysphagia Hematology: denies bleeding or bruising problems Ophthalmology: denies vision changes, eye redness, itching, discharge Urology: denies dysuria, difficulty urinating, hematuria, urinary frequency, urgency, incontinence Neurology: no headache, weakness, tingling, numbness, speech abnormality, memory loss, falls, dizziness Psychology: denies depressed mood, agitation, sleep problems   PHYSICAL EXAM: BP 102/70  Pulse 72  Temp 98.4 F (36.9 C) (Oral)  Resp 16  Wt 199 lb 8 oz (90.493 kg)  General Appearance:    Alert, cooperative, no distress, appears stated age  Head:    Normocephalic, without obvious abnormality, atraumatic  Eyes:    PERRL, conjunctiva/corneas clear, EOM's intact, fundi    benign  Ears:    Normal TM's and external ear canals  Nose:   Nares normal, mucosa normal, no drainage or sinus   tenderness  Throat:  left lower lip with 3mm round dark red uniform lesion unchanged x 3-4 years, otherwise lips, mucosa, and tongue normal; teeth and gums normal  Neck:   Supple, no lymphadenopathy;  thyroid:  no   enlargement/tenderness/nodules; no carotid   bruit or JVD  Back:    Spine nontender, no curvature, ROM normal, no CVA     tenderness  Lungs:     Clear to auscultation bilaterally without wheezes, rales or     rhonchi; respirations unlabored  Chest Wall:    No tenderness or deformity   Heart:    Regular rate and rhythm, S1 and S2 normal, no murmur, rub   or gallop  Breast Exam:     deferred  Abdomen:     Soft, non-tender, nondistended, normoactive bowel sounds,    no masses, no hepatosplenomegaly  Genitalia:    Deferred  Rectal:    deferred  Extremities:   No clubbing, cyanosis or edema  Pulses:   2+ and symmetric all extremities  Skin:   Solar keratosis of left temple region, otherwise skin color, texture, turgor normal, no rashes or lesions  Neurologic:   CNII-XII intact, normal strength, sensation and gait; reflexes 2+ and symmetric throughout          Psych:   Normal mood, affect, hygiene and grooming.    ASSESSMENT/PLAN: Encounter Diagnoses  Name Primary?  . Routine general medical examination at a health care facility Yes  . Hypothyroidism   . Essential hypertension, benign   . Need for influenza vaccination   . Vitamin d deficiency   . Hyperlipidemia   .  Lateral epicondylitis   . Rash     Physical exam - discussed healthy lifestyle, diet, exercise, preventative care, vaccinations, and addressed their concerns.  Handout given.  She will return her biometric form for completion.  Return at her convenience for breast/pelvic/rectal exam/gynecologic screening.  Hypothyroidism - labs today, compliant with medication.  HTN - controlled, compliant with medication.  Flu vaccine given, VIS and vaccine counseling given.  Vitamin D deficiency - advised she begin Vit D 1000 IU daily OTC.  She has not been taking Vit D.  Hyperlipidemia - currently not on medication.  Repeat fasting labs today.  Lateral epicondylitis - advised relative rest, ice, compression to the elbow with ACE or sleeve, and if not improving in 1-2 wk, call.  Rash - intermittent dermatitis, refill on Desonide cream for prn use.  RTC pending labs

## 2011-10-12 NOTE — Patient Instructions (Addendum)
Preventative Care for Adults - Female    Lateral epicondylitis - try ice twice daily for 3-5 days, rest of the arm, and you can use elbow sleeve or ACE wrap for compression.  If not improving in a week, let me know.  Begin Vitamin D 1000 IU daily for vitamin D deficiency.     MAINTAIN REGULAR HEALTH EXAMS:  A routine yearly physical is a good way to check in with your primary care provider about your health and preventive screening. It is also an opportunity to share updates about your health and any concerns you have, and receive a thorough all-over exam.   Most health insurance companies pay for at least some preventative services.  Check with your health plan for specific coverages.  WHAT PREVENTATIVE SERVICES DO WOMEN NEED?  Adult women should have their weight and blood pressure checked regularly.   Women age 54 and older should have their cholesterol levels checked regularly.  Women should be screened for cervical cancer with a Pap smear and pelvic exam beginning at either age 54, or 3 years after they become sexually activity.    Breast cancer screening generally begins at age 54 with a mammogram and breast exam by your primary care provider.    Beginning at age 16 and continuing to age 14, women should be screened for colorectal cancer.  Certain people may need continued testing until age 62.  Updating vaccinations is part of preventative care.  Vaccinations help protect against diseases such as the flu.  Osteoporosis is a disease in which the bones lose minerals and strength as we age. Women ages 54 and over should discuss this with their caregivers, as should women after menopause who have other risk factors.  Lab tests are generally done as part of preventative care to screen for anemia and blood disorders, to screen for problems with the kidneys and liver, to screen for bladder problems, to check blood sugar, and to check your cholesterol level.  Preventative services  generally include counseling about diet, exercise, avoiding tobacco, drugs, excessive alcohol consumption, and sexually transmitted infections.    GENERAL RECOMMENDATIONS FOR GOOD HEALTH:  Healthy diet:  Eat a variety of foods, including fruit, vegetables, animal or vegetable protein, such as meat, fish, chicken, and eggs, or beans, lentils, tofu, and grains, such as rice.  Drink plenty of water daily.  Decrease saturated fat in the diet, avoid lots of red meat, processed foods, sweets, fast foods, and fried foods.  Exercise:  Aerobic exercise helps maintain good heart health. At least 30-40 minutes of moderate-intensity exercise is recommended. For example, a brisk walk that increases your heart rate and breathing. This should be done on most days of the week.   Find a type of exercise or a variety of exercises that you enjoy so that it becomes a part of your daily life.  Examples are running, walking, swimming, water aerobics, and biking.  For motivation and support, explore group exercise such as aerobic class, spin class, Zumba, Yoga,or  martial arts, etc.    Set exercise goals for yourself, such as a certain weight goal, walk or run in a race such as a 5k walk/run.  Speak to your primary care provider about exercise goals.  Disease prevention:  If you smoke or chew tobacco, find out from your caregiver how to quit. It can literally save your life, no matter how long you have been a tobacco user. If you do not use tobacco, never begin.  Maintain a healthy diet and normal weight. Increased weight leads to problems with blood pressure and diabetes.   The Body Mass Index or BMI is a way of measuring how much of your body is fat. Having a BMI above 27 increases the risk of heart disease, diabetes, hypertension, stroke and other problems related to obesity. Your caregiver can help determine your BMI and based on it develop an exercise and dietary program to help you achieve or maintain this  important measurement at a healthful level.  High blood pressure causes heart and blood vessel problems.  Persistent high blood pressure should be treated with medicine if weight loss and exercise do not work.   Fat and cholesterol leaves deposits in your arteries that can block them. This causes heart disease and vessel disease elsewhere in your body.  If your cholesterol is found to be high, or if you have heart disease or certain other medical conditions, then you may need to have your cholesterol monitored frequently and be treated with medication.   Ask if you should have a cardiac stress test if your history suggests this. A stress test is a test done on a treadmill that looks for heart disease. This test can find disease prior to there being a problem.  Menopause can be associated with physical symptoms and risks. Hormone replacement therapy is available to decrease these. You should talk to your caregiver about whether starting or continuing to take hormones is right for you.   Osteoporosis is a disease in which the bones lose minerals and strength as we age. This can result in serious bone fractures. Risk of osteoporosis can be identified using a bone density scan. Women ages 54 and over should discuss this with their caregivers, as should women after menopause who have other risk factors. Ask your caregiver whether you should be taking a calcium supplement and Vitamin D, to reduce the rate of osteoporosis.   Avoid drinking alcohol in excess (more than two drinks per day).  Avoid use of street drugs. Do not share needles with anyone. Ask for professional help if you need assistance or instructions on stopping the use of alcohol, cigarettes, and/or drugs.  Brush your teeth twice a day with fluoride toothpaste, and floss once a day. Good oral hygiene prevents tooth decay and gum disease. The problems can be painful, unattractive, and can cause other health problems. Visit your dentist for a  routine oral and dental check up and preventive care every 6-12 months.   Look at your skin regularly.  Use a mirror to look at your back. Notify your caregivers of changes in moles, especially if there are changes in shapes, colors, a size larger than a pencil eraser, an irregular border, or development of new moles.  Safety:  Use seatbelts 100% of the time, whether driving or as a passenger.  Use safety devices such as hearing protection if you work in environments with loud noise or significant background noise.  Use safety glasses when doing any work that could send debris in to the eyes.  Use a helmet if you ride a bike or motorcycle.  Use appropriate safety gear for contact sports.  Talk to your caregiver about gun safety.  Use sunscreen with a SPF (or skin protection factor) of 15 or greater.  Lighter skinned people are at a greater risk of skin cancer. Don't forget to also wear sunglasses in order to protect your eyes from too much damaging sunlight. Damaging sunlight can accelerate cataract  formation.   Practice safe sex. Use condoms. Condoms are used for birth control and to help reduce the spread of sexually transmitted infections (or STIs).  Some of the STIs are gonorrhea (the clap), chlamydia, syphilis, trichomonas, herpes, HPV (human papilloma virus) and HIV (human immunodeficiency virus) which causes AIDS. The herpes, HIV and HPV are viral illnesses that have no cure. These can result in disability, cancer and death.   Keep carbon monoxide and smoke detectors in your home functioning at all times. Change the batteries every 6 months or use a model that plugs into the wall.   Vaccinations:  Stay up to date with your tetanus shots and other required immunizations. You should have a booster for tetanus every 10 years. Be sure to get your flu shot every year, since 5%-20% of the [place] population comes down with the flu. The flu vaccine changes each year, so being vaccinated once is not  enough. Get your shot in the fall, before the flu season peaks.   Other vaccines to consider:  Human Papilloma Virus or HPV causes cancer of the cervix, and other infections that can be transmitted from person to person. There is a vaccine for HPV, and females should get immunized between the ages of 47 and 19. It requires a series of 3 shots.   Pneumococcal vaccine to protect against certain types of pneumonia.  This is normally recommended for adults age 39 or older.  However, adults younger than 54 years old with certain underlying conditions such as diabetes, heart or lung disease should also receive the vaccine.  Shingles vaccine to protect against Varicella Zoster if you are older than age 65, or younger than 54 years old with certain underlying illness.  Hepatitis A vaccine to protect against a form of infection of the liver by a virus acquired from food.  Hepatitis B vaccine to protect against a form of infection of the liver by a virus acquired from blood or body fluids, particularly if you work in health care.  If you plan to travel internationally, check with your local health department for specific vaccination recommendations.  Cancer Screening:  Breast cancer screening is essential to preventive care for women. All women age 8 and older should perform a breast self-exam every month. At age 37 and older, women should have their caregiver complete a breast exam each year. Women at ages 63 and older should have a mammogram (x-ray film) of the breasts. Your caregiver can discuss how often you need mammograms.    Cervical cancer screening includes taking a Pap smear (sample of cells examined under a microscope) from the cervix (end of the uterus). It also includes testing for HPV (Human Papilloma Virus, which can cause cervical cancer). Screening and a pelvic exam should begin at age 48, or 3 years after a woman becomes sexually active. Screening should occur every year, with a Pap smear  but no HPV testing, up to age 40. After age 37, you should have a Pap smear every 3 years with HPV testing, if no HPV was found previously.   Most routine colon cancer screening begins at the age of 59. On a yearly basis, doctors may provide special easy to use take-home tests to check for hidden blood in the stool. Sigmoidoscopy or colonoscopy can detect the earliest forms of colon cancer and is life saving. These tests use a small camera at the end of a tube to directly examine the colon. Speak to your caregiver about this at  age 74, when routine screening begins (and is repeated every 5 years unless early forms of pre-cancerous polyps or small growths are found).

## 2011-10-13 ENCOUNTER — Encounter: Payer: Self-pay | Admitting: Medical

## 2011-10-13 LAB — TSH: TSH: 1.252 u[IU]/mL (ref 0.350–4.500)

## 2011-10-22 ENCOUNTER — Other Ambulatory Visit: Payer: Self-pay | Admitting: Family Medicine

## 2011-10-22 NOTE — Telephone Encounter (Signed)
Is this ok?

## 2011-10-22 NOTE — Telephone Encounter (Signed)
Saw [name] recently for CPE

## 2011-10-23 ENCOUNTER — Other Ambulatory Visit: Payer: Self-pay | Admitting: Medical

## 2011-10-23 MED ORDER — BUPROPION HCL ER (XL) 300 MG PO TB24: 300.0000 mg | ORAL_TABLET | ORAL | Status: DC

## 2011-10-23 NOTE — Telephone Encounter (Signed)
Verify that she is taking Wellbutrin?  I received refill request.  Is she taking this for depression.  Of all the things we discussed on her recent physical, I somehow didn't document why she was on Wellbutrin, but I wanted to be accurate and update chart accordingly before I send the refill.

## 2011-10-23 NOTE — Telephone Encounter (Signed)
Pt is taking 300 mg qd for depression

## 2011-10-26 NOTE — Telephone Encounter (Signed)
[name]

## 2012-02-17 ENCOUNTER — Other Ambulatory Visit: Payer: Self-pay | Admitting: Medical

## 2012-02-17 MED ORDER — BUPROPION HCL ER (XL) 300 MG PO TB24: 300.0000 mg | ORAL_TABLET | ORAL | Status: DC

## 2012-02-17 NOTE — Telephone Encounter (Signed)
pls call pt about the medication refill Wellbutrin.  I saw her in September for physical.  We deferred her breast/pelvic exam at that time.  I believe she is due for this to be updated including mammogram/pap, etc.  She can see me or Dr. Lynelle Doctor for this.  When I saw her for physical, we didn't really focus on the Wellbutrin.   How long has she been on this?    I sent refill, but have her return soon to f/u on this medication specifically as well as gynecological exam.

## 2012-02-17 NOTE — Telephone Encounter (Signed)
Is this okay to refill? 

## 2012-02-18 ENCOUNTER — Other Ambulatory Visit: Payer: Self-pay | Admitting: Medical

## 2012-02-19 NOTE — Telephone Encounter (Signed)
Is this okay to fill? 

## 2012-02-22 NOTE — Telephone Encounter (Signed)
I tried to call this patient a few times but no return call so I left a detailed message in referrence to her medication and setting up a follow up appointment. CLS

## 2012-04-18 ENCOUNTER — Other Ambulatory Visit: Payer: Self-pay

## 2012-04-18 DIAGNOSIS — Z1231 Encounter for screening mammogram for malignant neoplasm of breast: Secondary | ICD-10-CM

## 2012-04-19 ENCOUNTER — Other Ambulatory Visit: Payer: Self-pay | Admitting: Medical

## 2012-04-21 ENCOUNTER — Other Ambulatory Visit: Payer: Self-pay | Admitting: Medical

## 2012-05-12 ENCOUNTER — Encounter: Payer: Self-pay | Admitting: Family Medicine

## 2012-05-12 ENCOUNTER — Ambulatory Visit (INDEPENDENT_AMBULATORY_CARE_PROVIDER_SITE_OTHER): Payer: BC Managed Care – PPO | Admitting: Family Medicine

## 2012-05-12 ENCOUNTER — Ambulatory Visit
Admission: RE | Admit: 2012-05-12 | Discharge: 2012-05-12 | Disposition: A | Discharge status: Home / Self Care | Payer: BC Managed Care – PPO | Source: Ambulatory Visit

## 2012-05-12 VITALS — BP 108/72 | HR 72 | Ht 66.0 in | Wt 186.0 lb

## 2012-05-12 DIAGNOSIS — M25579 Pain in unspecified ankle and joints of unspecified foot: Secondary | ICD-10-CM

## 2012-05-12 DIAGNOSIS — M25571 Pain in right ankle and joints of right foot: Secondary | ICD-10-CM

## 2012-05-12 DIAGNOSIS — Z01419 Encounter for gynecological examination (general) (routine) without abnormal findings: Secondary | ICD-10-CM

## 2012-05-12 DIAGNOSIS — Z1231 Encounter for screening mammogram for malignant neoplasm of breast: Secondary | ICD-10-CM

## 2012-05-12 LAB — HM MAMMOGRAPHY: HM Mammogram: NORMAL

## 2012-05-12 NOTE — Progress Notes (Signed)
Chief Complaint  Patient presents with  . pap only    pap only   She is here for breast and pelvic exam, as this was not done with her physical per [facility].  Scheduled for mammogram later today.  Denies any breast concerns (no discharge, lumps, tenderness).  Noticed an itchy dry spot on the left breast yesterday.  She is s/p hysterectomy for benign reasons.  Still has 1 ovary remaining.  Denies any pelvic pain, vaginal discharge or bleeding.  Has some dryness.  Hot flashes have improved.  She complains of external vaginal itching for quite a while, intermittent, finds that she will scratch in her sleep  She is complaining of pain in her right ankle only when she wears unsupportive shoes.  She will notice pain, as well as swelling posterior to the lateral ankle.  Occurs only if she is on her feet a lot.  Doesn't have this problem if wearing sneakers or other supportive shoes, just with sandals, flip flops or if barefoot.  Denies any injury or change in activity.  Has been going on for a few months.  Past Medical History  Diagnosis Date  . Unspecified hypothyroidism   . Obesity   . Menopause 2002  . Hyperlipidemia 04/2004  . Hemorrhoids, internal 03/2002  . Colon polyp 6/09    Dr. Loreta Ave (no path report in chart)  . Hypertension   . History of mammogram 08/2010    normal screening  . Vitamin D deficiency 11/2010  . Normal cardiac stress test 08/19/04    normal 2D echo and cardiolite perfusion study; Dr. Aleen Campi  . Sleep disturbance 06/04/2004    normal sleep study   Past Surgical History  Procedure Laterality Date  . Cesarean section  04/08/81-02/02/90    X4  . Arthroscopic knee  08/18/10 Dr.Beane    right knee   . Arthroscopic knee  10/2010    left; Dr. Shelle Iron  . Abdominal hysterectomy  11/1996    1 ovary remains; due to dysmenorrhea   Current Outpatient Prescriptions on File Prior to Visit  Medication Sig Dispense Refill  . buPROPion (WELLBUTRIN XL) 300 MG 24 hr tablet TAKE 1  TABLET BY MOUTH EVERY MORNING  30 tablet  0  . buPROPion (WELLBUTRIN XL) 300 MG 24 hr tablet TAKE 1 TABLET EVERY MORNING  30 tablet  3  . buPROPion (WELLBUTRIN XL) 300 MG 24 hr tablet TAKE 1 TABLET (300 MG TOTAL) BY MOUTH EVERY MORNING.  30 tablet  1  . buPROPion (WELLBUTRIN XL) 300 MG 24 hr tablet TAKE 1 TABLET (300 MG TOTAL) BY MOUTH EVERY MORNING.  30 tablet  1  . desonide (DESOWEN) 0.05 % cream Apply topically 2 (two) times daily.  60 g  0  . ibuprofen (ADVIL,MOTRIN) 200 MG tablet Take 800 mg by mouth as needed.        Marland Kitchen lisinopril-hydrochlorothiazide (PRINZIDE,ZESTORETIC) 10-12.5 MG per tablet Take 1 tablet by mouth daily.  30 tablet  11  . lisinopril-hydrochlorothiazide (PRINZIDE,ZESTORETIC) 10-12.5 MG per tablet TAKE 1 TABLET EVERY DAY  30 tablet  1  . SYNTHROID 75 MCG tablet Take 1 tablet (75 mcg total) by mouth daily.  30 tablet  11  . SYNTHROID 75 MCG tablet TAKE 1 TABLET EVERY DAY  30 tablet  1  . HYDROcodone-acetaminophen (NORCO) 7.5-325 MG per tablet Take 1 tablet by mouth as needed.        . methocarbamol (ROBAXIN) 750 MG tablet Take 750 mg by mouth as needed.  No current facility-administered medications on file prior to visit.   Allergies  Allergen Reactions  . Zinc Hives   ROS:  Denies fevers, URI symptoms, cough, shortness of breath, chest pain, edema.  +rashes as above.  Denies GI or GU complaints (except as noted above).  Moods are good, no other concerns.  PHYSICAL EXAM: BP 108/72  Pulse 72  Ht 5\' 6"  (1.676 m)  Wt 186 lb (84.369 kg)  BMI 30.04 kg/m2 Well developed, pleasant female in no distress Breast exam: no nipple inversion, nipple drainage, dominant masses or axillary lymphadenopathy.  There is a small patchy of dry skin at the 9 o'clock position on the left areola (only present x 1 day per pt), and very mild erythema on the medial left breast (she had been rubbing area).  Otherwise skin intact  External genitalia with mild erythema on external labia  bilaterally, on external area.  No warmth, satellite lesions or tenderness. Otherwise BUS/vaginal are normal.  Bimanual exam is normal.  Uterus is surgically absent.  No adnexal masses or tenderness  Rectal exam:  Normal sphincter control.  Heme negative stool, no masses  R ankle--normal exam--nontender to palpation.  Area of discomfort is posterior to lateral malleolus.  Nontender, no soft tissue swelling.  Normal exam with good endpoints.  No pain with eversion, inversion, or dorsiflexion against resistance.  Only slight discomfort (and normal strength) with plantarflexion against resistance.  No clubbing, cyanosis or edema, normal pulses  Psych: normal mood, affect, hygiene and grooming Neuro: alert and oriented.  Cranial nerves grossly intact.  Normal strength, sensation, gait.     ASSESSMENT/PLAN:  Routine gynecological examination  Ankle pain, right  External vaginal itching--possibilities include contact dermatitis (allergic reaction--pantiliners, soaps, fabric softener, etc), prolonged exposure to moisture, so try and keep the area clean and dry, and sometimes related to atrophic changes (thinning due to being postmenopausal).  If things don't improve with changing products and keeping the area dry, you may use an over-the-counter hydrocortisone cream as needed (intermittent use only) for itching.  Return for re-evaluation if symptoms worsen.  Pap not indicated due to hysterectomy for benign reasons.  Yearly pelvic and breast exams recommended.  Itchy spot on left breast--only of 1 day duration.  Recommend use of moisturizer, and OTC hydrocortisone BID for a few days if needed.  R ankle pain--likely mild strain vs tendonitis, symptomatic only with unsupportive shoes.  Discussed proper shoewear, and use of NSAIDs prn.  Hemoccult kit given to pt

## 2012-05-12 NOTE — Patient Instructions (Signed)
External vaginal itching--possibilities include contact dermatitis (allergic reaction--pantiliners, soaps, fabric softener, etc), prolonged exposure to moisture, so try and keep the area clean and dry, and sometimes related to atrophic changes (thinning due to being postmenopausal).  If things don't improve with changing products and keeping the area dry, you may use an over-the-counter hydrocortisone cream as needed (intermittent use only) for itching.  Return for re-evaluation if symptoms worsen.  Continue with monthly breast exams as shown, yearly mammograms. Return the hemoccult cards.  Wear supportive shoes.  You may use ibuprofen or aleve periodically if needed for pain and swelling

## 2012-06-18 ENCOUNTER — Other Ambulatory Visit: Payer: Self-pay | Admitting: Medical

## 2012-08-17 ENCOUNTER — Other Ambulatory Visit: Payer: Self-pay | Admitting: Medical

## 2012-08-17 NOTE — Telephone Encounter (Signed)
Is it okay to fill these meds?

## 2012-10-19 ENCOUNTER — Other Ambulatory Visit: Payer: Self-pay | Admitting: Medical

## 2012-10-19 NOTE — Telephone Encounter (Signed)
Is this okay to refill? She was last seen in 04/2012

## 2012-11-30 ENCOUNTER — Telehealth: Payer: Self-pay | Admitting: Medical

## 2012-12-06 ENCOUNTER — Telehealth: Payer: Self-pay | Admitting: Medical

## 2012-12-06 NOTE — Telephone Encounter (Signed)
tsd  °

## 2012-12-17 ENCOUNTER — Other Ambulatory Visit: Payer: Self-pay | Admitting: Medical

## 2012-12-17 ENCOUNTER — Other Ambulatory Visit: Payer: Self-pay | Admitting: Family Medicine

## 2012-12-19 ENCOUNTER — Other Ambulatory Visit: Payer: Self-pay | Admitting: *Deleted

## 2012-12-19 ENCOUNTER — Telehealth: Payer: Self-pay | Admitting: *Deleted

## 2012-12-19 MED ORDER — LISINOPRIL-HYDROCHLOROTHIAZIDE 10-12.5 MG PO TABS: ORAL_TABLET | ORAL | Status: DC

## 2012-12-19 MED ORDER — BUPROPION HCL ER (XL) 300 MG PO TB24: ORAL_TABLET | ORAL | Status: DC

## 2012-12-19 NOTE — Telephone Encounter (Signed)
appt scheduled and call in meds x 1 month.

## 2012-12-19 NOTE — Telephone Encounter (Signed)
Please schedule med check. She also has HTN and BP meds will likely also need to be refilled.  Refill x 1 only, and set up med check

## 2012-12-19 NOTE — Telephone Encounter (Signed)
Schedule for 12/22

## 2012-12-19 NOTE — Telephone Encounter (Signed)
Pt has cpe in March with Dr. Lynelle Doctor.  Pt needs refill for Synthroid 75 mcg qd, Wellbutrin 300 mg qd, Lisinopril 10-12.5 mg qd to [facility]

## 2012-12-19 NOTE — Telephone Encounter (Signed)
Patient last seen 4/14, last TSH 9/13, has CPE scheduled with you for 04/12/13. What would you like to do with this? Come in for TSH? Schedule med check? Wait for CPE? Please advise, thanks.

## 2012-12-19 NOTE — Telephone Encounter (Signed)
She can also have 30 days worth of the other meds requested (lisinopril HCT and wellbutrin)

## 2012-12-19 NOTE — Telephone Encounter (Signed)
I spoke with patient to schedule her for med check, she states that the only days she could come in would be 12/22, 23 or 24th as she is on vacation. 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 I was looking at 12/15/~10:45am as you have an opening and she could not do it. Wants to know if you can work her in the week of Christmas? I could find a spot her her.

## 2013-01-09 ENCOUNTER — Ambulatory Visit (INDEPENDENT_AMBULATORY_CARE_PROVIDER_SITE_OTHER): Payer: BC Managed Care – PPO | Admitting: Family Medicine

## 2013-01-09 ENCOUNTER — Encounter: Payer: Self-pay | Admitting: Family Medicine

## 2013-01-09 VITALS — BP 120/84 | HR 68 | Ht 66.0 in | Wt 203.0 lb

## 2013-01-09 DIAGNOSIS — F329 Major depressive disorder, single episode, unspecified: Secondary | ICD-10-CM

## 2013-01-09 DIAGNOSIS — Z23 Encounter for immunization: Secondary | ICD-10-CM

## 2013-01-09 DIAGNOSIS — I10 Essential (primary) hypertension: Secondary | ICD-10-CM

## 2013-01-09 DIAGNOSIS — E559 Vitamin D deficiency, unspecified: Secondary | ICD-10-CM

## 2013-01-09 DIAGNOSIS — E7800 Pure hypercholesterolemia, unspecified: Secondary | ICD-10-CM

## 2013-01-09 DIAGNOSIS — B351 Tinea unguium: Secondary | ICD-10-CM

## 2013-01-09 DIAGNOSIS — E039 Hypothyroidism, unspecified: Secondary | ICD-10-CM

## 2013-01-09 DIAGNOSIS — M79609 Pain in unspecified limb: Secondary | ICD-10-CM

## 2013-01-09 DIAGNOSIS — M79674 Pain in right toe(s): Secondary | ICD-10-CM

## 2013-01-09 LAB — COMPREHENSIVE METABOLIC PANEL
ALT: 12 U/L (ref 0–35)
AST: 12 U/L (ref 0–37)
Albumin: 4.6 g/dL (ref 3.5–5.2)
Alkaline Phosphatase: 82 U/L (ref 39–117)
Calcium: 9.5 mg/dL (ref 8.4–10.5)
Potassium: 4.3 mEq/L (ref 3.5–5.3)
Sodium: 139 mEq/L (ref 135–145)
Total Protein: 7.3 g/dL (ref 6.0–8.3)

## 2013-01-09 LAB — LIPID PANEL
HDL: 64 mg/dL (ref >39)
LDL Cholesterol: 110 mg/dL — ABNORMAL HIGH (ref 0–99)
VLDL: 24 mg/dL (ref 0–40)

## 2013-01-09 LAB — TSH: TSH: 1.295 u[IU]/mL (ref 0.350–4.500)

## 2013-01-09 MED ORDER — LISINOPRIL-HYDROCHLOROTHIAZIDE 10-12.5 MG PO TABS: 1.0000 | ORAL_TABLET | Freq: Every day | ORAL | Status: DC

## 2013-01-09 MED ORDER — BUPROPION HCL ER (XL) 300 MG PO TB24: ORAL_TABLET | ORAL | Status: DC

## 2013-01-09 NOTE — Progress Notes (Signed)
Chief Complaint  Patient presents with  . Hypothyroidism    fasting med check.   . Nail Problem    second toe on right foot she believes she has toenail fungus. Also right foot pinky toe is red and swollen-injured it over the summer-can you take a look at it today.    Right 2nd toenail is discolored, thickened. Denies pain.  She has been using topical OTC medications without benefit. Noticed it about 7 months ago.  Doesn't like how it looks.  She injured her right 5th toe in July.  It was purple, painful, couldn't wear shoes for a long time.  She no longer has pain, but notices that the toe looks different than her left pinkie toe.  It is redder and puffier (swollen) than her other toe, but it no longer hurts.  Hypothyroidism follow-up:  She has been taking her thyroid medication along with all of her other medications, as she always has.  She takes it an hour before food.  She has not missed any doses.  She takes a calcium+D supplement when she remembers, about 3-4x/week, but takes that in the evening.  She notes some weight gain, but admits to eating poorly recently.  She denies any significant changes in hair/skin/energy/bowels/moods.  Hypertension follow-up:  Blood pressures are not checked elsewhere.  Denies dizziness, headaches, chest pain, muscle cramps.  Denies side effects of medications.  Vitamin D deficiency:  She hasn't been taking a multivitamin or vitamin D supplement.  She is just taking the Calcium with D in the evenings when she remembers, about 3-4x/week.  Depression:  She was put on medication since her divorce, about 5 years ago.  She has never tried to come off.  She admits to having a lot going on in her life right now (daughter's husband left her).  Wondering if she should try and stop the anti-depressants.  Past Medical History  Diagnosis Date  . Unspecified hypothyroidism   . Obesity   . Menopause 2002  . Hyperlipidemia 04/2004  . Hemorrhoids, internal 03/2002  .  Colon polyp 6/09    Dr. Loreta Ave (no path report in chart)  . Hypertension   . History of mammogram 08/2010    normal screening  . Vitamin D deficiency 11/2010  . Normal cardiac stress test 08/19/04    normal 2D echo and cardiolite perfusion study; Dr. Aleen Campi  . Sleep disturbance 06/04/2004    normal sleep study   Past Surgical History  Procedure Laterality Date  . Cesarean section  04/08/81-02/02/90    X4  . Arthroscopic knee  08/18/10 Dr.Beane    right knee   . Arthroscopic knee  10/2010    left; Dr. Shelle Iron  . Abdominal hysterectomy  11/1996    1 ovary remains; due to dysmenorrhea   History   Social History  . Marital Status: Divorced    Spouse Name: N/A    Number of Children: 4  . Years of Education: N/A   Occupational History  . [employer], Customer service and sales rep    Social History Main Topics  . Smoking status: Former Smoker    Quit date: 01/20/1996  . Smokeless tobacco: Not on file  . Alcohol Use: Yes     Comment: 1 drink twice a year.  . Drug Use: No  . Sexual Activity: Not Currently   Other Topics Concern  . Not on file   Social History Narrative   Lives with 2 youngest children; divorced; exercise -  yardwork, mowing   Current outpatient prescriptions:aspirin 325 MG tablet, Take 325 mg by mouth daily., Disp: , Rfl: ;  buPROPion (WELLBUTRIN XL) 300 MG 24 hr tablet, TAKE 1 TABLET BY MOUTH EVERY MORNING, Disp: 30 tablet, Rfl: 0;  ibuprofen (ADVIL,MOTRIN) 200 MG tablet, Take 800 mg by mouth as needed.  , Disp: , Rfl: ;  lisinopril-hydrochlorothiazide (PRINZIDE,ZESTORETIC) 10-12.5 MG per tablet, Take 1 tablet by mouth daily., Disp: 30 tablet, Rfl: 11 SYNTHROID 75 MCG tablet, Take 1 tablet (75 mcg total) by mouth daily., Disp: 30 tablet, Rfl: 11;  desonide (DESOWEN) 0.05 % cream, Apply topically 2 (two) times daily., Disp: 60 g, Rfl: 0  Allergies  Allergen Reactions  . Zinc Hives   ROS:  Denies headaches, dizziness, chest pain, palpitations, GI  complaints, GU complaints, rashes, URI concerns, joint pains, or other concerns except as noted in HPI  PHYSICAL EXAM: BP 120/84  Pulse 68  Ht 5\' 6"  (1.676 m)  Wt 203 lb (92.08 kg)  BMI 32.78 kg/m2 Pleasant, obese female in no distress Neck: no lymphadenopathy, thyromegaly or carotid bruit Heart: regular rate and rhythm, no murmur Lungs: clear bilaterally Back: no CVA tenderness Abdomen: soft, nontender, no organomegaly or mass Extremities: no edema, 2+ pulse. Right 2nd toe--white discoloration and thickened. Right 5th toe is pink--mildly warm in comparison to other toes.  Nontender.  Skin is intact Neuro: alert and oriented, cranial nerves grossly intact. Normal gait, sensation, strength Psych: normal mood, affect, hygiene and grooming  ASSESSMENT/PLAN:  Unspecified hypothyroidism - euthyroid by history.  check TSH today.  reviewed proper way to take med. refill after labs back - Plan: TSH  Need for prophylactic vaccination and inoculation against influenza - Plan: Flu Vaccine QUAD 36+ mos IM  Unspecified vitamin D deficiency - recommended 1000 IU daily.  will advise of recommendations after labs back.  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 When stressors are less, consider decreasing to 150mg , and possibly taper off.  discuss at CPE in Spring - Plan: buPROPion (WELLBUTRIN XL) 300 MG 24 hr tablet  Essential hypertension, benign - well controlled - Plan: Comprehensive metabolic panel, lisinopril-hydrochlorothiazide (PRINZIDE,ZESTORETIC) 10-12.5 MG per tablet  Pure hypercholesterolemia - continue lowfat, low cholesterol diet - Plan: Lipid panel  Onychomycosis - asymptomatic/cosmetic.  will return for further eval if interested in treatment.  polishing nails recommended for cosmesis  Toe pain, right - suspect  possible fracture (distant history). given lack of pain, and normal circulation, reassured.  consider x-ray to confirm, if desired  Depression--consider trial of decreasing dose (with potential to try getting off medication entirely) in the Spring, when she thinks stressors will be less.  She has appt in March. Will discuss at that visit trying 150mg .  If she has recurrent symptoms on the lower dose, then will continue the 300mg  longterm.  If she stays on the 150mg  for 3 months without any problems, can try stopping the medication.  F/u as scheduled in March

## 2013-01-09 NOTE — Patient Instructions (Signed)
Return for discussion of fungal infection in toenails if it becomes painful, nail is ingrowing, or other concerns.  Try and get 1000 IU of Vitamin D every day.  We will be in touch with your results later this week.  Continue your current medications.

## 2013-01-10 LAB — VITAMIN D 25 HYDROXY (VIT D DEFICIENCY, FRACTURES): Vit D, 25-Hydroxy: 22 ng/mL — ABNORMAL LOW (ref 30–89)

## 2013-01-10 MED ORDER — SYNTHROID 75 MCG PO TABS: 75.0000 ug | ORAL_TABLET | Freq: Every day | ORAL | Status: DC

## 2013-01-16 MED ORDER — VITAMIN D (ERGOCALCIFEROL) 1.25 MG (50000 UNIT) PO CAPS: 50000.0000 [IU] | ORAL_CAPSULE | ORAL | Status: DC

## 2013-01-16 NOTE — Addendum Note (Signed)
Addended by: Barbette Or A on: 01/16/2013 11:53 AM   Modules accepted: Orders

## 2013-04-12 ENCOUNTER — Ambulatory Visit (INDEPENDENT_AMBULATORY_CARE_PROVIDER_SITE_OTHER): Payer: BC Managed Care – PPO | Admitting: Family Medicine

## 2013-04-12 ENCOUNTER — Encounter: Payer: Self-pay | Admitting: Family Medicine

## 2013-04-12 VITALS — BP 102/70 | HR 72 | Ht 66.0 in | Wt 207.0 lb

## 2013-04-12 DIAGNOSIS — F329 Major depressive disorder, single episode, unspecified: Secondary | ICD-10-CM

## 2013-04-12 DIAGNOSIS — I10 Essential (primary) hypertension: Secondary | ICD-10-CM

## 2013-04-12 DIAGNOSIS — E559 Vitamin D deficiency, unspecified: Secondary | ICD-10-CM

## 2013-04-12 DIAGNOSIS — E039 Hypothyroidism, unspecified: Secondary | ICD-10-CM

## 2013-04-12 DIAGNOSIS — F3289 Other specified depressive episodes: Secondary | ICD-10-CM

## 2013-04-12 DIAGNOSIS — Z0000 Encounter for general adult medical examination without abnormal findings: Secondary | ICD-10-CM

## 2013-04-12 DIAGNOSIS — E669 Obesity, unspecified: Secondary | ICD-10-CM

## 2013-04-12 LAB — POCT URINALYSIS DIPSTICK
Bilirubin, UA: NEGATIVE
Blood, UA: NEGATIVE
Glucose, UA: NEGATIVE
Ketones, UA: NEGATIVE
Leukocytes, UA: NEGATIVE
Nitrite, UA: NEGATIVE
Protein, UA: NEGATIVE
Spec Grav, UA: 1.01
Urobilinogen, UA: NEGATIVE
pH, UA: 6

## 2013-04-12 NOTE — Patient Instructions (Signed)
  HEALTH MAINTENANCE RECOMMENDATIONS:  It is recommended that you get at least 30 minutes of aerobic exercise at least 5 days/week (for weight loss, you may need as much as 60-90 minutes). This can be any activity that gets your heart rate up. This can be divided in 10-15 minute intervals if needed, but try and build up your endurance at least once a week.  Weight bearing exercise is also recommended twice weekly.  Eat a healthy diet with lots of vegetables, fruits and fiber.  "Colorful" foods have a lot of vitamins (ie green vegetables, tomatoes, red peppers, etc).  Limit sweet tea, regular sodas and alcoholic beverages, all of which has a lot of calories and sugar.  Up to 1 alcoholic drink daily may be beneficial for women (unless trying to lose weight, watch sugars).  Drink a lot of water.  Calcium recommendations are 1200-1500 mg daily (1500 mg for postmenopausal women or women without ovaries), and vitamin D 1000 IU daily.  This should be obtained from diet and/or supplements (vitamins), and calcium should not be taken all at once, but in divided doses.  Monthly self breast exams and yearly mammograms for women over the age of 56 is recommended.  Sunscreen of at least SPF 30 should be used on all sun-exposed parts of the skin when outside between the hours of 10 am and 4 pm (not just when at beach or pool, but even with exercise, golf, tennis, and yard work!)  Use a sunscreen that says "broad spectrum" so it covers both UVA and UVB rays, and make sure to reapply every 1-2 hours.  Remember to change the batteries in your smoke detectors when changing your clock times in the spring and fall.  Use your seat belt every time you are in a car, and please drive safely and not be distracted with cell phones and texting while driving.  Change your aspirin dose to 81mg  daily.  We should check Hepatitis C antibodies with your next blood draw (recommended for baby boomers).

## 2013-04-12 NOTE — Progress Notes (Signed)
Chief Complaint  Patient presents with  . Annual Exam    fasting annual exam, unsure if she needs pap or pelvic done-had pelvic exam done with Dr.Breon Diss 05/12/12. Did not do eye exam as she just had one last month with Dr.Byrnes. No concerns or complaints today.    [name] is a 58 y.o. female who presents for a complete physical.  She has no specific concerns, other than following up on her vitamin D deficiency--she took last pill last week.  Vitamin D deficiency:  She took the last of the 3 month prescription course of Vitamin D last week.  She has also been taking a Calcium with D supplement once daily.  Hypothyroidism follow-up: She has been taking her thyroid medication along with all of her other medications, as she always has. She takes it an hour before food. She has not missed any doses. She takes a calcium+D supplement every night. She denies any significant changes in hair/skin/energy/bowels/moods. Last TSH was 12/2012, normal at 1.295.  Hypertension follow-up: Blood pressures are not checked elsewhere. Denies dizziness, headaches, chest pain, muscle cramps. Denies side effects of medications.   Depression: She was put on medication since her divorce.  She has never tried to come off. She admits to still having stressors in her life (related to stressful job, and daughter's separation).    Immunization History  Administered Date(s) Administered  . Influenza Split 10/12/2011  . Influenza,inj,Quad PF,36+ Mos 01/09/2013  . Tdap 09/19/2008   Last Pap smear: last GYN exam 04/2012; pap n/a due to hysterectomy Last mammogram: 04/2012  Last colonoscopy: 12/2007 Last DEXA: never Dentist: once yearly Ophtho: once yearly, wears contacts Exercise:  Yardwork. She reports that exercise is limited by her knees  Past Medical History  Diagnosis Date  . Unspecified hypothyroidism   . Obesity   . Menopause 2002  . Hyperlipidemia 04/2004  . Hemorrhoids, internal 03/2002  . Colon polyp 6/09     Dr. Collene Mares (no path report in chart)  . Hypertension   . History of mammogram 08/2010    normal screening  . Vitamin D deficiency 11/2010  . Normal cardiac stress test 08/19/04    normal 2D echo and cardiolite perfusion study; Dr. West Bali  . Sleep disturbance 06/04/2004    normal sleep study    Past Surgical History  Procedure Laterality Date  . Cesarean section  04/08/81-02/02/90    X4  . Arthroscopic knee  08/18/10 Dr.Beane    right knee   . Arthroscopic knee  10/2010    left; Dr. Tonita Cong  . Abdominal hysterectomy  11/1996    1 ovary remains; due to dysmenorrhea    History   Social History  . Marital Status: Divorced    Spouse Name: N/A    Number of Children: 57  . Years of Education: N/A   Occupational History  . [employer] Caroli, Customer service and sales rep    Social History Main Topics  . Smoking status: Former Smoker    Quit date: 01/20/1996  . Smokeless tobacco: Never Used  . Alcohol Use: Yes     Comment: 1 drink twice a year.  . Drug Use: No  . Sexual Activity: Not Currently   Other Topics Concern  . Not on file   Social History Narrative   Lives with 1 youngest children; divorced; exercise - yardwork, mowing.  Children live in [place], [place] and [place]    Family History  Problem Relation Age of Onset  .  Arthritis Father   . Hypertension Father   . Cancer Father     lung cancer  . Hypertension Mother   . Diabetes Neg Hx   . Heart disease Neg Hx   . Stroke Neg Hx   . Hypothyroidism Daughter    Outpatient Encounter Prescriptions as of 04/12/2013  Medication Sig Note  . aspirin 325 MG tablet Take 325 mg by mouth daily.   Marland Kitchen buPROPion (WELLBUTRIN XL) 300 MG 24 hr tablet TAKE 1 TABLET BY MOUTH EVERY MORNING   . Calcium Carbonate-Vitamin D (CALCIUM-VITAMIN D) 500-200 MG-UNIT per tablet Take 1 tablet by mouth daily. 04/12/2013: Daily   . lisinopril-hydrochlorothiazide (PRINZIDE,ZESTORETIC) 10-12.5 MG per tablet Take 1 tablet by mouth daily.   Marland Kitchen  SYNTHROID 75 MCG tablet Take 1 tablet (75 mcg total) by mouth daily.   Marland Kitchen desonide (DESOWEN) 0.05 % cream Apply topically 2 (two) times daily. 04/12/2013: Uses prn eczema, rashes (rare)  . ibuprofen (ADVIL,MOTRIN) 200 MG tablet Take 800 mg by mouth as needed.     . [DISCONTINUED] Vitamin D, Ergocalciferol, (DRISDOL) 50000 UNITS CAPS capsule Take 1 capsule (50,000 Units total) by mouth every 7 (seven) days. 04/12/2013: Completed 12 week course last week     Allergies  Allergen Reactions  . Zinc Hives    ROS:  The patient denies anorexia, fever, headaches,  vision changes, decreased hearing, ear pain, sore throat, breast concerns, chest pain, palpitations, dizziness, syncope, dyspnea on exertion, cough, swelling, nausea, vomiting, diarrhea, constipation, abdominal pain, melena, hematochezia, indigestion/heartburn, hematuria, incontinence, dysuria, vaginal bleeding, discharge, odor or itch, genital lesions, numbness, tingling, weakness, tremor, suspicious skin lesions, depression, anxiety, abnormal bleeding/bruising, or enlarged lymph nodes.  20 pound weight gain in the last year, only 4 in the last 3 months.  She thinks this is related to stress, late night eating. She is alone a lot of the time, cooks for just herself (daughter who lives with her is engaged, not home much). Bilateral knee pain, no other joint pain.  Sees Dr. Tonita Cong, had cortisone shots in both knees in January, which has helped.   PHYSICAL EXAM: BP 102/70  Pulse 72  Ht 5' 6" (1.676 m)  Wt 207 lb (93.895 kg)  BMI 33.43 kg/m2  General Appearance:    Alert, cooperative, no distress, appears stated age  Head:    Normocephalic, without obvious abnormality, atraumatic  Eyes:    PERRL, conjunctiva/corneas clear, EOM's intact, fundi    benign  Ears:    Normal TM's and external ear canals  Nose:   Nares normal, mucosa normal, no drainage or sinus   tenderness  Throat:   Lips, mucosa, and tongue normal; teeth and gums normal  Neck:    Supple, no lymphadenopathy;  thyroid:  no   enlargement/tenderness/nodules; no carotid   bruit or JVD  Back:    Spine nontender, no curvature, ROM normal, no CVA     tenderness  Lungs:     Clear to auscultation bilaterally without wheezes, rales or     ronchi; respirations unlabored  Chest Wall:    No tenderness or deformity   Heart:    Regular rate and rhythm, S1 and S2 normal, no murmur, rub   or gallop  Breast Exam:    No tenderness, masses, or nipple discharge or inversion.      No axillary lymphadenopathy  Abdomen:     Soft, non-tender, nondistended, normoactive bowel sounds,    no masses, no hepatosplenomegaly  Genitalia:    Normal external  genitalia without lesions.  BUS and vagina normal; uterus is surgically absent; adnexa not enlarged, nontender, no masses.  Pap not performed  Rectal:    Normal tone, no masses or tenderness; guaiac negative stool  Extremities:   No clubbing, cyanosis or edema. Thickened, discolored right 2nd toenail, others are normal.  nontender  Pulses:   2+ and symmetric all extremities  Skin:   Skin color, texture, turgor normal, no rashes or lesions  Lymph nodes:   Cervical, supraclavicular, and axillary nodes normal  Neurologic:   CNII-XII intact, normal strength, sensation and gait; reflexes 2+ and symmetric throughout          Psych:   Normal mood, affect, hygiene and grooming.     ASSESSMENT/PLAN:  Routine general medical examination at a health care facility - Plan: POCT Urinalysis Dipstick  Unspecified hypothyroidism - controlled  Unspecified vitamin D deficiency - suboptimally replaced with OTC Vit D.  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  Add  separate vitamin D  HTN--well controlled Hypothyroidism--euthyroid by symptoms; last TSH 3 months ago was normal. Depression--when stressors are less, consider decreasing dose to 129m for a few months, and if still doing well, can consider stopping.  She will let [place] if/when she is ready to do this.  Discussed monthly self breast exams and yearly mammograms after the age of 413 at least 30 minutes of aerobic activity at least 5 days/week; proper sunscreen use reviewed; healthy diet, including goals of calcium and vitamin D intake and alcohol recommendations (less than or equal to 1 drink/day) reviewed; regular seatbelt use; changing batteries in smoke detectors.  Immunization recommendations discussed.  Colonoscopy recommendations reviewed--UTD, hemoccult kit given.  Recommended changing her aspirin from 325 to 840m  Risks and benefits reviewed.  F/u 6 months for fasting med check (all labs were normal, other than vitamin D, in December) Do Hep C Ab at that time (baby boomer, has never been screened; declined bloodwork today)

## 2013-11-20 ENCOUNTER — Encounter: Payer: Self-pay | Admitting: Family Medicine

## 2014-01-12 ENCOUNTER — Other Ambulatory Visit: Payer: Self-pay | Admitting: Family Medicine

## 2014-01-15 ENCOUNTER — Other Ambulatory Visit: Payer: Self-pay | Admitting: Family Medicine

## 2014-01-15 NOTE — Telephone Encounter (Signed)
Called and left message for pt to call back and schedule an appt since she was suppose to be seen 6 months later from her march appt and nothing has been scheduled yet. So when she has made an appt then i will refill her med for only a 30 day supply

## 2014-01-15 NOTE — Telephone Encounter (Signed)
Pt made an appt for jan so please fill meds. Pt is completely out.

## 2014-02-07 ENCOUNTER — Encounter: Payer: Self-pay | Admitting: Family Medicine

## 2014-02-07 ENCOUNTER — Ambulatory Visit (INDEPENDENT_AMBULATORY_CARE_PROVIDER_SITE_OTHER): Payer: BLUE CROSS/BLUE SHIELD | Admitting: Family Medicine

## 2014-02-07 VITALS — BP 110/76 | HR 64 | Ht 66.0 in | Wt 197.0 lb

## 2014-02-07 DIAGNOSIS — E039 Hypothyroidism, unspecified: Secondary | ICD-10-CM

## 2014-02-07 DIAGNOSIS — Z23 Encounter for immunization: Secondary | ICD-10-CM

## 2014-02-07 DIAGNOSIS — F324 Major depressive disorder, single episode, in partial remission: Secondary | ICD-10-CM

## 2014-02-07 DIAGNOSIS — F325 Major depressive disorder, single episode, in full remission: Secondary | ICD-10-CM

## 2014-02-07 DIAGNOSIS — E559 Vitamin D deficiency, unspecified: Secondary | ICD-10-CM

## 2014-02-07 DIAGNOSIS — Z5181 Encounter for therapeutic drug level monitoring: Secondary | ICD-10-CM

## 2014-02-07 DIAGNOSIS — I10 Essential (primary) hypertension: Secondary | ICD-10-CM

## 2014-02-07 LAB — CBC WITH DIFFERENTIAL/PLATELET
BASOS ABS: 0.1 10*3/uL (ref 0.0–0.1)
BASOS PCT: 1 % (ref 0–1)
Eosinophils Absolute: 0.2 10*3/uL (ref 0.0–0.7)
Eosinophils Relative: 2 % (ref 0–5)
HCT: 40.9 % (ref 36.0–46.0)
Hemoglobin: 13.9 g/dL (ref 12.0–15.0)
LYMPHS PCT: 20 % (ref 12–46)
Lymphs Abs: 1.8 10*3/uL (ref 0.7–4.0)
MCH: 30.2 pg (ref 26.0–34.0)
MCHC: 34 g/dL (ref 30.0–36.0)
MCV: 88.7 fL (ref 78.0–100.0)
MPV: 11.7 fL (ref 8.6–12.4)
Monocytes Absolute: 0.6 10*3/uL (ref 0.1–1.0)
Monocytes Relative: 7 % (ref 3–12)
NEUTROS PCT: 70 % (ref 43–77)
Neutro Abs: 6.3 10*3/uL (ref 1.7–7.7)
PLATELETS: 215 10*3/uL (ref 150–400)
RBC: 4.61 MIL/uL (ref 3.87–5.11)
RDW: 14.7 % (ref 11.5–15.5)
WBC: 9 10*3/uL (ref 4.0–10.5)

## 2014-02-07 LAB — COMPREHENSIVE METABOLIC PANEL
ALBUMIN: 4.8 g/dL (ref 3.5–5.2)
ALK PHOS: 88 U/L (ref 39–117)
ALT: 13 U/L (ref 0–35)
AST: 15 U/L (ref 0–37)
BUN: 12 mg/dL (ref 6–23)
CO2: 28 mEq/L (ref 19–32)
Calcium: 10.2 mg/dL (ref 8.4–10.5)
Chloride: 100 mEq/L (ref 96–112)
Creat: 0.83 mg/dL (ref 0.50–1.10)
Glucose, Bld: 97 mg/dL (ref 70–99)
POTASSIUM: 4.6 meq/L (ref 3.5–5.3)
Sodium: 138 mEq/L (ref 135–145)
TOTAL PROTEIN: 7.9 g/dL (ref 6.0–8.3)
Total Bilirubin: 0.4 mg/dL (ref 0.2–1.2)

## 2014-02-07 LAB — LIPID PANEL
CHOL/HDL RATIO: 2.8 ratio
Cholesterol: 187 mg/dL (ref 0–200)
HDL: 67 mg/dL (ref >39)
LDL CALC: 92 mg/dL (ref 0–99)
Triglycerides: 142 mg/dL (ref <150)
VLDL: 28 mg/dL (ref 0–40)

## 2014-02-07 LAB — TSH: TSH: 0.896 u[IU]/mL (ref 0.350–4.500)

## 2014-02-07 MED ORDER — LISINOPRIL-HYDROCHLOROTHIAZIDE 10-12.5 MG PO TABS: 1.0000 | ORAL_TABLET | Freq: Every day | ORAL | Status: DC

## 2014-02-07 MED ORDER — BUPROPION HCL ER (XL) 300 MG PO TB24: 300.0000 mg | ORAL_TABLET | Freq: Every morning | ORAL | Status: DC

## 2014-02-07 NOTE — Patient Instructions (Signed)
Blood pressure was great. We will be in touch with your lab results (via MyChart if you sign up, or by phone if you do not).  Good luck with your knee surgery!

## 2014-02-07 NOTE — Progress Notes (Signed)
Chief Complaint  Patient presents with  . Hypertension    fasting med check. Also brings in medical clearance form for right knee surgery.    Vitamin D deficiency: She has been inconsistent in taking both her calcium with D and her separate D.    Hypothyroidism follow-up: She has been taking her thyroid medication along with all of her other medications, as she always has. She takes it an hour before food. She has not missed any doses. She denies any significant changes in hair/skin/energy/bowels/moods.  Hypertension follow-up: Blood pressures are normal when checked at the orthopedist (runs 110-128/60's). Denies dizziness, headaches, chest pain, muscle cramps. Denies side effects of medications.   Depression: She was put on medication since her divorce. She has never tried to come off. She admits to still having stressors in her life (related to stressful job, and pain from her knee). Not interested in trying to lower dose of medication at this point, might consider after knee pain has resolved (plans on having both knees replaced).  She has been seeing Dr. Shelle Iron for knee pain.  Had arthroscopic surgery on both knees back in 2012, and she is now ready for knee replacements.  Plans to have the right done first, and probably the left later this year if all goes well with the right.  Exercise has been limited due to pain.  Takes 1/2 hydrocodone upon returning from work each day, plus ibuprofen in the morning and tylenol in the afternoon.  Past Medical History  Diagnosis Date  . Unspecified hypothyroidism   . Obesity   . Menopause 2002  . Hyperlipidemia 04/2004  . Hemorrhoids, internal 03/2002  . Colon polyp 6/09    Dr. Loreta Ave (no path report in chart)  . Hypertension   . History of mammogram 08/2010    normal screening  . Vitamin D deficiency 11/2010  . Normal cardiac stress test 08/19/04    normal 2D echo and cardiolite perfusion study; Dr. Aleen Campi  . Sleep disturbance 06/04/2004   normal sleep study   Past Surgical History  Procedure Laterality Date  . Cesarean section  04/08/81-02/02/90    X4  . Arthroscopic knee  08/18/10 Dr.Beane    right knee   . Arthroscopic knee  10/2010    left; Dr. Shelle Iron  . Abdominal hysterectomy  11/1996    1 ovary remains; due to dysmenorrhea   History   Social History  . Marital Status: Divorced    Spouse Name: N/A    Number of Children: 4  . Years of Education: N/A   Occupational History  . [employer], Customer service and sales rep    Social History Main Topics  . Smoking status: Former Smoker    Quit date: 01/20/1996  . Smokeless tobacco: Never Used  . Alcohol Use: Yes     Comment: 1 drink twice a year.  . Drug Use: No  . Sexual Activity: Not Currently   Other Topics Concern  . Not on file   Social History Narrative   Lives with 1 youngest children; divorced; exercise - yardwork, mowing.  Children live in [place], [place] and [place]   Family History  Problem Relation Age of Onset  . Arthritis Father   . Hypertension Father   . Cancer Father     lung cancer  . Hypertension Mother   . Diabetes Mother     borderline  . Heart disease Neg Hx   . Stroke Neg Hx   . Hypothyroidism Daughter  Outpatient Encounter Prescriptions as of 02/07/2014  Medication Sig Note  . acetaminophen (TYLENOL) 325 MG tablet Take 650 mg by mouth every 6 (six) hours as needed. 02/07/2014: Takes in the afternoons  . aspirin 81 MG tablet Take 81 mg by mouth daily.   Marland Kitchen. buPROPion (WELLBUTRIN XL) 300 MG 24 hr tablet TAKE 1 TABLET BY MOUTH EVERY MORNING   . HYDROcodone-acetaminophen (NORCO/VICODIN) 5-325 MG per tablet Take 1 tablet by mouth every 8 (eight) hours as needed. for pain 02/07/2014: From Dr. Shelle IronBeane.  Takes 1/2 tablet every evening after work  . ibuprofen (ADVIL,MOTRIN) 200 MG tablet Take 800 mg by mouth as needed.   02/07/2014: Every am  . lisinopril-hydrochlorothiazide (PRINZIDE,ZESTORETIC) 10-12.5 MG per tablet TAKE 1 TABLET BY  MOUTH DAILY.   . SYNTHROID 75 MCG tablet TAKE 1 TABLET BY MOUTH EVERY DAY   . Calcium Carbonate-Vitamin D (CALCIUM-VITAMIN D) 500-200 MG-UNIT per tablet Take 1 tablet by mouth daily. 02/07/2014: Takes sporadically  . desonide (DESOWEN) 0.05 % cream Apply topically 2 (two) times daily. (Patient not taking: Reported on 02/07/2014) 04/12/2013: Uses prn eczema, rashes (rare)  . [DISCONTINUED] aspirin 325 MG tablet Take 325 mg by mouth daily.    Allergies  Allergen Reactions  . Zinc Hives   ROS:  The patient denies anorexia, fever, weight changes, headaches,  vision changes, decreased hearing, ear pain, sore throat, breast concerns, chest pain, palpitations, dizziness, syncope, dyspnea on exertion, cough, swelling, nausea, vomiting, diarrhea, constipation, abdominal pain, melena, hematochezia, indigestion/heartburn, hematuria, incontinence, dysuria, vaginal discharge, odor or itch, genital lesions, numbness, tingling, weakness, tremor, suspicious skin lesions, depression, anxiety, abnormal bleeding/bruising, or enlarged lymph nodes. +bilateral knee pain  PHYSICAL EXAM: BP 110/76 mmHg  Pulse 64  Ht 5\' 6"  (1.676 m)  Wt 197 lb (89.359 kg)  BMI 31.81 kg/m2 Well developed, pleasant female in no distress HEENT: PERRL, EOMI, conjunctiva clear.  OP clear Neck: No lymphadenopathy, thyromegaly or carotid bruit Heart: regular rate and rhythm without murmur, rub, gallop Lungs: clear bilaterallly Back: no CVA tenderness Abdomen: soft, nontender, no organomegaly or mass, no bruit Extremities: no edema, 2+ pulses Skin: no suspicious lesions, rashes Neuro: alert and oriented.  Cranial nerves intact. Normal strength, sensation, gait Psych: normal mood, affect, hygiene and grooming  ASSESSMENT/PLAN:  Essential hypertension, benign - well controlled - Plan: Lipid panel, Comprehensive metabolic panel, lisinopril-hydrochlorothiazide (PRINZIDE,ZESTORETIC) 10-12.5 MG per tablet  Vitamin D deficiency - discussed  importance of daily supplementation, long-term  - Plan: Vit D  25 hydroxy (rtn osteoporosis monitoring)  Hypothyroidism, unspecified hypothyroidism type - euthyroid by history - Plan: TSH  Major depression in remission - continue medications given ongoing stressors.  consider trial at lower dose within the year (after knee pain improved s/p surgery) - Plan: buPROPion (WELLBUTRIN XL) 300 MG 24 hr tablet  Medication monitoring encounter - Plan: Comprehensive metabolic panel, CBC with Differential, Vit D  25 hydroxy (rtn osteoporosis monitoring), TSH   Flu shot today  FFO--cleared for knee surgery (assuming normal routine pre-op eval) Negative cardiac w/u in 2006. No family history, or any current complaints/symptoms to warrant concern.  Blood pressure is well controlled.  F/u 6 months for CPE (due in March, but can wait 6 months)

## 2014-02-08 ENCOUNTER — Other Ambulatory Visit: Payer: Self-pay | Admitting: Family Medicine

## 2014-02-08 LAB — VITAMIN D 25 HYDROXY (VIT D DEFICIENCY, FRACTURES): VIT D 25 HYDROXY: 15 ng/mL — AB (ref 30–100)

## 2014-02-08 NOTE — Addendum Note (Signed)
Addended by: Debbrah AlarSMITH, Shalandra Leu F on: 02/08/2014 08:16 AM   Modules accepted: Orders

## 2014-02-12 ENCOUNTER — Other Ambulatory Visit: Payer: Self-pay | Admitting: *Deleted

## 2014-02-12 MED ORDER — ERGOCALCIFEROL 1.25 MG (50000 UT) PO CAPS: 50000.0000 [IU] | ORAL_CAPSULE | ORAL | Status: DC

## 2014-02-15 ENCOUNTER — Ambulatory Visit: Payer: Self-pay | Admitting: Orthopedic Surgery

## 2014-02-26 NOTE — Patient Instructions (Addendum)
[name]  02/26/2014   Your procedure is scheduled on: 03/08/14   [facility] and follow signs to               Short Stay Center at       0530 AM.  Call this number if you have problems the morning of surgery [phone]   Remember:  Do not eat food or drink liquids :After Midnight.     Take these medicines the morning of surgery with A SIP OF WATER: wellbutrin, Synthroid                                You may not have any metal on your body including hair pins and              piercings  Do not wear jewelry, make-up, lotions, powders or perfumes., deodorant.               Do not wear nail polish.  Do not shave  48 hours prior to surgery.                Do not bring valuables to the hospital. [place] [facility] IS NOT             RESPONSIBLE   FOR VALUABLES.  Contacts, dentures or bridgework may not be worn into surgery.  Leave suitcase in the car. After surgery it may be brought to your room.         Special Instructions:coughing and deep breathing exercises, leg exercises               Please read over the following fact sheets you were given: _____________________________________________________________________             [facility] - Preparing for Surgery Before surgery, you can play an important role.  Because skin is not sterile, your skin needs to be as free of germs as possible.  You can reduce the number of germs on your skin by washing with CHG (chlorahexidine gluconate) soap before surgery.  CHG is an antiseptic cleaner which kills germs and bonds with the skin to continue killing germs even after washing. Please DO NOT use if you have an allergy to CHG or antibacterial soaps.  If your skin becomes reddened/irritated stop using the CHG and inform your nurse when you arrive at Short Stay. Do not shave (including legs and underarms) for at least 48 hours prior to the first CHG shower.  You may shave your face/neck. Please  follow these instructions carefully:  1.  Shower with CHG Soap the night before surgery and the  morning of Surgery.  2.  If you choose to wash your hair, wash your hair first as usual with your  normal  shampoo.  3.  After you shampoo, rinse your hair and body thoroughly to remove the  shampoo.                           4.  Use CHG as you would any other liquid soap.  You can apply chg directly  to the skin and wash                       Gently with a scrungie or clean washcloth.  5.  Apply the CHG Soap to your body ONLY FROM THE NECK DOWN.   Do not use on face/ open                           Wound or open sores. Avoid contact with eyes, ears mouth and genitals (private parts).                       Wash face,  Genitals (private parts) with your normal soap.             6.  Wash thoroughly, paying special attention to the area where your surgery  will be performed.  7.  Thoroughly rinse your body with warm water from the neck down.  8.  DO NOT shower/wash with your normal soap after using and rinsing off  the CHG Soap.                9.  Pat yourself dry with a clean towel.            10.  Wear clean pajamas.            11.  Place clean sheets on your bed the night of your first shower and do not  sleep with pets. Day of Surgery : Do not apply any lotions/deodorants the morning of surgery.  Please wear clean clothes to the hospital/surgery center.  FAILURE TO FOLLOW THESE INSTRUCTIONS MAY RESULT IN THE CANCELLATION OF YOUR SURGERY PATIENT SIGNATURE_________________________________  NURSE SIGNATURE__________________________________  ________________________________________________________________________  WHAT IS A BLOOD TRANSFUSION? Blood Transfusion Information  A transfusion is the replacement of blood or some of its parts. Blood is made up of multiple cells which provide different functions.  Red blood cells carry oxygen and are used for blood loss replacement.  White blood cells  fight against infection.  Platelets control bleeding.  Plasma helps clot blood.  Other blood products are available for specialized needs, such as hemophilia or other clotting disorders. BEFORE THE TRANSFUSION  Who gives blood for transfusions?   Healthy volunteers who are fully evaluated to make sure their blood is safe. This is blood bank blood. Transfusion therapy is the safest it has ever been in the practice of medicine. Before blood is taken from a donor, a complete history is taken to make sure that person has no history of diseases nor engages in risky social behavior (examples are intravenous drug use or sexual activity with multiple partners). The donor's travel history is screened to minimize risk of transmitting infections, such as malaria. The donated blood is tested for signs of infectious diseases, such as HIV and hepatitis. The blood is then tested to be sure it is compatible with you in order to minimize the chance of a transfusion reaction. If you or a relative donates blood, this is often done in anticipation of surgery and is not appropriate for emergency situations. It takes many days to process the donated blood. RISKS AND COMPLICATIONS Although transfusion therapy is very safe and saves many lives, the main dangers of transfusion include:  1. Getting an infectious disease. 2. Developing a transfusion reaction. This is an allergic reaction to something in the blood you were given. Every precaution is taken to prevent this. The decision to have a blood transfusion has been considered carefully by your caregiver before blood is given. Blood is not given unless the benefits outweigh the risks. AFTER THE TRANSFUSION  Right after  receiving a blood transfusion, you will usually feel much better and more energetic. This is especially true if your red blood cells have gotten low (anemic). The transfusion raises the level of the red blood cells which carry oxygen, and this usually  causes an energy increase.  The nurse administering the transfusion will monitor you carefully for complications. HOME CARE INSTRUCTIONS  No special instructions are needed after a transfusion. You may find your energy is better. Speak with your caregiver about any limitations on activity for underlying diseases you may have. SEEK MEDICAL CARE IF:   Your condition is not improving after your transfusion.  You develop redness or irritation at the intravenous (IV) site. SEEK IMMEDIATE MEDICAL CARE IF:  Any of the following symptoms occur over the next 12 hours:  Shaking chills.  You have a temperature by mouth above 102 F (38.9 C), not controlled by medicine.  Chest, back, or muscle pain.  People around you feel you are not acting correctly or are confused.  Shortness of breath or difficulty breathing.  Dizziness and fainting.  You get a rash or develop hives.  You have a decrease in urine output.  Your urine turns a dark color or changes to pink, red, or brown. Any of the following symptoms occur over the next 10 days:  You have a temperature by mouth above 102 F (38.9 C), not controlled by medicine.  Shortness of breath.  Weakness after normal activity.  The white part of the eye turns yellow (jaundice).  You have a decrease in the amount of urine or are urinating less often.  Your urine turns a dark color or changes to pink, red, or brown. Document Released: 01/03/2000 Document Revised: 03/30/2011 Document Reviewed: 08/22/2007 ExitCare Patient Information 2014 [place].  _______________________________________________________________________  Incentive Spirometer  An incentive spirometer is a tool that can help keep your lungs clear and active. This tool measures how well you are filling your lungs with each breath. Taking long deep breaths may help reverse or decrease the chance of developing breathing (pulmonary) problems (especially infection)  following:  A long period of time when you are unable to move or be active. BEFORE THE PROCEDURE   If the spirometer includes an indicator to show your best effort, your nurse or respiratory therapist will set it to a desired goal.  If possible, sit up straight or lean slightly forward. Try not to slouch.  Hold the incentive spirometer in an upright position. INSTRUCTIONS FOR USE  3. Sit on the edge of your bed if possible, or sit up as far as you can in bed or on a chair. 4. Hold the incentive spirometer in an upright position. 5. Breathe out normally. 6. Place the mouthpiece in your mouth and seal your lips tightly around it. 7. Breathe in slowly and as deeply as possible, raising the piston or the ball toward the top of the column. 8. Hold your breath for 3-5 seconds or for as long as possible. Allow the piston or ball to fall to the bottom of the column. 9. Remove the mouthpiece from your mouth and breathe out normally. 10. Rest for a few seconds and repeat Steps 1 through 7 at least 10 times every 1-2 hours when you are awake. Take your time and take a few normal breaths between deep breaths. 11. The spirometer may include an indicator to show your best effort. Use the indicator as a goal to work toward during each repetition. 12. After each set  of 10 deep breaths, practice coughing to be sure your lungs are clear. If you have an incision (the cut made at the time of surgery), support your incision when coughing by placing a pillow or rolled up towels firmly against it. Once you are able to get out of bed, walk around indoors and cough well. You may stop using the incentive spirometer when instructed by your caregiver.  RISKS AND COMPLICATIONS  Take your time so you do not get dizzy or light-headed.  If you are in pain, you may need to take or ask for pain medication before doing incentive spirometry. It is harder to take a deep breath if you are having pain. AFTER USE  Rest and  breathe slowly and easily.  It can be helpful to keep track of a log of your progress. Your caregiver can provide you with a simple table to help with this. If you are using the spirometer at home, follow these instructions: [place] IF:   You are having difficultly using the spirometer.  You have trouble using the spirometer as often as instructed.  Your pain medication is not giving enough relief while using the spirometer.  You develop fever of 100.5 F (38.1 C) or higher. SEEK IMMEDIATE MEDICAL CARE IF:   You cough up bloody sputum that had not been present before.  You develop fever of 102 F (38.9 C) or greater.  You develop worsening pain at or near the incision site. MAKE SURE YOU:   Understand these instructions.  Will watch your condition.  Will get help right away if you are not doing well or get worse. Document Released: 05/18/2006 Document Revised: 03/30/2011 Document Reviewed: 07/19/2006 [facility] Patient Information 2014 [place], [place].   ________________________________________________________________________

## 2014-02-27 ENCOUNTER — Ambulatory Visit: Payer: Self-pay | Admitting: Orthopedic Surgery

## 2014-02-27 NOTE — H&P (Signed)
[name] DOB: 07/24/1955 Divorced / Language: English / Race: White Female  H&P Date: 02/27/14  Chief Complaint: R knee pain  History of Present Illness  The patient is a 59 year old female who comes in today for a preoperative History and Physical. The patient is scheduled for a right total knee arthroplasty to be performed by Dr. Jeffrey C. Beane, MD at [facility] on 03/08/14. Rozann reports years of B/L knee pain, progressively worsening, now interfering with ADL's, refractory to steroid injections, pain medications, NSAIDs, quad strengthening, HEP, bracing, activity modifications, and relative rest.  Dr. Beane and the patient mutually agreed to proceed with a total knee replacement. Risks and benefits of the procedure were discussed including stiffness, suboptimal range of motion, persistent pain, infection requiring removal of prosthesis and reinsertion, need for prophylactic antibiotics in the future, for example, dental procedures, possible need for manipulation, revision in the future and also anesthetic complications including DVT, PE, etc. We discussed the perioperative course, time in the hospital, postoperative recovery and the need for elevation to control swelling. We also discussed the predicted range of motion and the probability that squatting and kneeling would be unobtainable in the future. In addition, postoperative anticoagulation was discussed. We will obtain preoperative medical clearance if necessary. Provided the patient with illustrated handout and discussed it in detail. They will enroll in the total joint replacement educational forum at the hospital.  Her [facility] pre-op is tomorrow. Her daughters have already purchased her a walker, transfer shower seat, removable shower head, and will be buying a raised commode seat as well. She has been taking Norco for pain, is unsure if she tolerates Percocet, ES Norco has made her sick in the past.  Problem List/Past Medical  Hx Depression Hypothyroidism Cellulitis L leg 11/2013  Allergies Zinc *MULTIVITAMINS* Latex Dermatitis.  Family History  Cancer Father, Brother, Sister. Father deceased, lung CA Mother Living. age 90 Siblings one brother and one sister living both healthy. one brother deceased age 4- leukemia. one sister deceased age 13- brain tumor. Children In good health. 4 daughters  Social History  Tobacco use Former smoker. 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 working full time Marital status Divorced. Current occupation Personal Banker [employer] Plans home with HHPT. one level home with 2 steps to enter Advance Directives none  Medication History Norco (5-325MG Tablet, 1 (one) Oral 1 TAB PO Q 8 HRS PRN PAIN Tylenol Extra Strength (500MG Tablet, Oral) Active. Synthroid (75MCG Tablet, Oral) Active. Lisinopril-Hydrochlorothiazide (10-12.5MG Tablet, Oral) Active. BuPROPion HCl ER (XL) (300MG Tablet ER 24HR, Oral) Active. Vitamin D (Ergocalciferol) (50000UNIT Capsule, Oral) Active. Aspirin (81MG Capsule, 1 (one) Oral) Active. Ibuprofen (800MG Tablet, 1 (one) Oral) Active. Acetaminophen ER (650MG Tablet ER, Oral) Active. Medications Reconciled  Past Surgical History Cesarean Delivery 4 Arthroscopic Knee Surgery - Both post-op N/V Hysterectomy  Review of Systems General Not Present- Chills, Fatigue, Fever, Memory Loss, Night Sweats, Weight Gain and Weight Loss. Skin Not Present- Eczema, Hives, Itching, Lesions and Rash. HEENT Not Present- Dentures, Double Vision, Headache, Hearing Loss, Tinnitus and Visual Loss. Respiratory Not Present- Allergies, Chronic Cough, Coughing up blood,  Shortness of breath at rest and Shortness of breath with exertion. Cardiovascular Not Present- Chest Pain, Difficulty Breathing Lying Down,   Murmur, Palpitations, Racing/skipping heartbeats and Swelling. Gastrointestinal Not Present- Abdominal Pain, Bloody Stool, Constipation, Diarrhea, Difficulty Swallowing, Heartburn, Jaundice, Loss of appetitie, Nausea and Vomiting. Female Genitourinary Not Present- Blood in Urine, Discharge, Flank Pain, Incontinence, Painful Urination, Urgency, Urinary frequency, Urinary Retention, Urinating at Night and Weak urinary stream. Musculoskeletal Present- Joint Pain, Joint Stiffness, Joint Swelling, Morning Stiffness and Muscle Pain. Not Present- Back Pain, Muscle Weakness and Spasms. Neurological Not Present- Blackout spells, Difficulty with balance, Dizziness, Paralysis, Tremor and Weakness. Psychiatric Not Present- Insomnia.  Physical Exam General Mental Status -Alert, cooperative and good historian. General Appearance-pleasant, Not in acute distress. Orientation-Oriented X3. Build & Nutrition-Well nourished and Well developed.  Head and Neck Head-normocephalic, atraumatic . Neck Global Assessment - supple, no bruit auscultated on the right, no bruit auscultated on the left.  Eye Pupil - Bilateral-Regular and Round. Motion - Bilateral-EOMI.  Chest and Lung Exam Auscultation Breath sounds - clear at anterior chest wall and clear at posterior chest wall. Adventitious sounds - No Adventitious sounds.  Cardiovascular Auscultation Rhythm - Regular rate and rhythm. Heart Sounds - S1 WNL and S2 WNL. Murmurs & Other Heart Sounds - Auscultation of the heart reveals - No Murmurs.  Abdomen Palpation/Percussion Tenderness - Abdomen is non-tender to palpation. Rigidity (guarding) - Abdomen is soft. Auscultation Auscultation of the abdomen reveals - Bowel sounds normal.  Female Genitourinary Not done, not pertinent to present  illness  Musculoskeletal Note: Right knee exam on inspection reveals no evidence of ecchymosis, deformity or erythema. She has a varus thrust, varus deformity to the right. Range is -3 to 90. She is tender to the medial joint line. Patellofemoral pain to compression. Trace effusion. Nontender over the fibular head or the peroneal nerve. Nontender over the quadriceps insertion of the patellar ligament insertion. The range of motion was full. Provocative maneuvers revealed a negative Lachman, negative anterior and posterior drawer and a negative McMurray. No instability was noted with varus and valgus stressing at 0 or 30 degrees. On manual motor test the quadriceps and hamstrings were 5/5. Sensory exam was intact to light touch.  Imaging AP standing and lateral demonstrates bone on bone arthrosis in the medial compartment. There is an increase in her varus deformity.  Assessment & Plan Primary osteoarthritis of right knee (M17.11)  Pt with end-stage R knee DJD, bone-on-bone, refractory to conservative tx, scheduled for R total knee replacement by Dr. Beane on 03/08/14. We again discussed the procedure itself as well as risks, complications and alternatives, including but not limited to DVT, PE, infx, bleeding, failure of procedure, need for secondary procedure including manipulation, nerve injury, ongoing pain/symptoms, anesthesia risk, even stroke or death. Also discussed typical post-op protocols, activity restrictions, need for PT, flexion/extension exercises, time out of work. Discussed need for DVT ppx post-op with Xarelto then ASA per protocol. Discussed dental ppx. Also discussed limitations post-operatively such as kneeling and squatting. All questions were answered. Patient desires to proceed with surgery as scheduled. Will hold ASA and NSAIDs accordingly. Will remain NPO after MN night before surgery. Will present to [facility] for pre-op testing. Plan Xarelto 2 weeks post-op for DVT ppx then full dose  ASA before returning to her normal 81mg ASA. Plan pain medication (ES Norco or Percocet) with anti-emetic, Robaxin, Colace. Plan home with HHPT post-op with family members at home for assistance. Will follow up 10-14 days post-op for suture removal and xrays. Rx's given for her DME items, also discussed transitioning from walker to cane during her PT, Rx for   cane given as well.  Plan R total knee replacement  Signed electronically by Jaclyn Bissell, PA-C for Dr. Beane    

## 2014-02-28 ENCOUNTER — Encounter (HOSPITAL_COMMUNITY): Payer: Self-pay

## 2014-02-28 ENCOUNTER — Ambulatory Visit (HOSPITAL_COMMUNITY)
Admission: RE | Admit: 2014-02-28 | Discharge: 2014-02-28 | Disposition: A | Discharge status: Home / Self Care | Payer: BLUE CROSS/BLUE SHIELD | Source: Ambulatory Visit | Attending: Orthopedic Surgery | Admitting: Orthopedic Surgery

## 2014-02-28 ENCOUNTER — Other Ambulatory Visit: Payer: Self-pay

## 2014-02-28 ENCOUNTER — Encounter (HOSPITAL_COMMUNITY)
Admission: RE | Admit: 2014-02-28 | Discharge: 2014-02-28 | Disposition: A | Discharge status: Home / Self Care | Payer: BLUE CROSS/BLUE SHIELD | Source: Ambulatory Visit | Attending: Specialist | Admitting: Specialist

## 2014-02-28 DIAGNOSIS — M179 Osteoarthritis of knee, unspecified: Secondary | ICD-10-CM | POA: Insufficient documentation

## 2014-02-28 DIAGNOSIS — Z01818 Encounter for other preprocedural examination: Secondary | ICD-10-CM | POA: Insufficient documentation

## 2014-02-28 DIAGNOSIS — M1711 Unilateral primary osteoarthritis, right knee: Secondary | ICD-10-CM

## 2014-02-28 HISTORY — DX: Other specified postprocedural states: Z98.890

## 2014-02-28 HISTORY — DX: Nausea with vomiting, unspecified: R11.2

## 2014-02-28 HISTORY — DX: Unspecified osteoarthritis, unspecified site: M19.90

## 2014-02-28 LAB — CBC
HCT: 42.1 % (ref 36.0–46.0)
Hemoglobin: 14.3 g/dL (ref 12.0–15.0)
MCH: 31 pg (ref 26.0–34.0)
MCHC: 34 g/dL (ref 30.0–36.0)
MCV: 91.3 fL (ref 78.0–100.0)
PLATELETS: 191 10*3/uL (ref 150–400)
RBC: 4.61 MIL/uL (ref 3.87–5.11)
RDW: 14 % (ref 11.5–15.5)
WBC: 9 10*3/uL (ref 4.0–10.5)

## 2014-02-28 LAB — SURGICAL PCR SCREEN
MRSA, PCR: NEGATIVE
Staphylococcus aureus: POSITIVE — AB

## 2014-02-28 LAB — BASIC METABOLIC PANEL
ANION GAP: 8 (ref 5–15)
BUN: 13 mg/dL (ref 6–23)
CO2: 28 mmol/L (ref 19–32)
Calcium: 9.9 mg/dL (ref 8.4–10.5)
Chloride: 100 mmol/L (ref 96–112)
Creatinine, Ser: 0.78 mg/dL (ref 0.50–1.10)
GFR calc Af Amer: >90 mL/min (ref >90)
GLUCOSE: 110 mg/dL — AB (ref 70–99)
POTASSIUM: 4.2 mmol/L (ref 3.5–5.1)
Sodium: 136 mmol/L (ref 135–145)

## 2014-02-28 LAB — PROTIME-INR
INR: 1.05 (ref 0.00–1.49)
PROTHROMBIN TIME: 13.8 s (ref 11.6–15.2)

## 2014-02-28 LAB — URINALYSIS, ROUTINE W REFLEX MICROSCOPIC
Bilirubin Urine: NEGATIVE
Glucose, UA: NEGATIVE mg/dL
Hgb urine dipstick: NEGATIVE
KETONES UR: NEGATIVE mg/dL
LEUKOCYTES UA: NEGATIVE
NITRITE: NEGATIVE
Protein, ur: NEGATIVE mg/dL
Specific Gravity, Urine: 1.008 (ref 1.005–1.030)
UROBILINOGEN UA: 0.2 mg/dL (ref 0.0–1.0)
pH: 7 (ref 5.0–8.0)

## 2014-02-28 LAB — ABO/RH: ABO/RH(D): O NEG

## 2014-03-01 ENCOUNTER — Ambulatory Visit: Payer: Self-pay | Admitting: Orthopedic Surgery

## 2014-03-07 NOTE — Anesthesia Preprocedure Evaluation (Signed)
Anesthesia Evaluation  Patient identified by MRN, date of birth, ID band Patient awake    Reviewed: Allergy & Precautions, H&P , NPO status , Patient's Chart, lab work & pertinent test results  History of Anesthesia Complications (+) PONV  Airway Mallampati: II  TM Distance: >3 FB Neck ROM: full    Dental no notable dental hx.    Pulmonary neg pulmonary ROS, former smoker,  breath sounds clear to auscultation  Pulmonary exam normal       Cardiovascular Exercise Tolerance: Good hypertension, Pt. on medications Rhythm:regular Rate:Normal     Neuro/Psych Depression negative neurological ROS  negative psych ROS   GI/Hepatic negative GI ROS, Neg liver ROS,   Endo/Other  negative endocrine ROSHypothyroidism   Renal/GU negative Renal ROS  negative genitourinary   Musculoskeletal   Abdominal   Peds  Hematology negative hematology ROS (+)   Anesthesia Other Findings   Reproductive/Obstetrics negative OB ROS                             Anesthesia Physical Anesthesia Plan  ASA: II  Anesthesia Plan: General   Post-op Pain Management:    Induction: Intravenous  Airway Management Planned: Oral ETT  Additional Equipment:   Intra-op Plan:   Post-operative Plan: Extubation in OR  Informed Consent: I have reviewed the patients History and Physical, chart, labs and discussed the procedure including the risks, benefits and alternatives for the proposed anesthesia with the patient or authorized representative who has indicated his/her understanding and acceptance.   Dental Advisory Given  Plan Discussed with: CRNA and Surgeon  Anesthesia Plan Comments:         Anesthesia Quick Evaluation

## 2014-03-08 ENCOUNTER — Inpatient Hospital Stay (HOSPITAL_COMMUNITY): Payer: BLUE CROSS/BLUE SHIELD | Admitting: Anesthesiology

## 2014-03-08 ENCOUNTER — Encounter (HOSPITAL_COMMUNITY)
Admission: RE | Disposition: A | Discharge status: Home Health | Payer: Self-pay | Source: Ambulatory Visit | Attending: Specialist

## 2014-03-08 ENCOUNTER — Encounter (HOSPITAL_COMMUNITY): Payer: Self-pay | Admitting: Anesthesiology

## 2014-03-08 ENCOUNTER — Inpatient Hospital Stay (HOSPITAL_COMMUNITY)
Admission: RE | Admit: 2014-03-08 | Discharge: 2014-03-11 | DRG: 470 | Disposition: A | Discharge status: Home Health | Payer: BLUE CROSS/BLUE SHIELD | Source: Ambulatory Visit | Attending: Specialist | Admitting: Specialist

## 2014-03-08 ENCOUNTER — Inpatient Hospital Stay (HOSPITAL_COMMUNITY): Payer: BLUE CROSS/BLUE SHIELD

## 2014-03-08 DIAGNOSIS — M1711 Unilateral primary osteoarthritis, right knee: Secondary | ICD-10-CM | POA: Diagnosis present

## 2014-03-08 DIAGNOSIS — E039 Hypothyroidism, unspecified: Secondary | ICD-10-CM | POA: Diagnosis present

## 2014-03-08 DIAGNOSIS — Z96651 Presence of right artificial knee joint: Secondary | ICD-10-CM

## 2014-03-08 DIAGNOSIS — Z79899 Other long term (current) drug therapy: Secondary | ICD-10-CM

## 2014-03-08 DIAGNOSIS — E785 Hyperlipidemia, unspecified: Secondary | ICD-10-CM | POA: Diagnosis present

## 2014-03-08 DIAGNOSIS — I10 Essential (primary) hypertension: Secondary | ICD-10-CM | POA: Diagnosis present

## 2014-03-08 DIAGNOSIS — Z7982 Long term (current) use of aspirin: Secondary | ICD-10-CM

## 2014-03-08 DIAGNOSIS — Z87891 Personal history of nicotine dependence: Secondary | ICD-10-CM | POA: Diagnosis not present

## 2014-03-08 DIAGNOSIS — E669 Obesity, unspecified: Secondary | ICD-10-CM | POA: Diagnosis present

## 2014-03-08 DIAGNOSIS — Z6831 Body mass index (BMI) 31.0-31.9, adult: Secondary | ICD-10-CM | POA: Diagnosis not present

## 2014-03-08 DIAGNOSIS — F329 Major depressive disorder, single episode, unspecified: Secondary | ICD-10-CM | POA: Diagnosis present

## 2014-03-08 DIAGNOSIS — M25561 Pain in right knee: Secondary | ICD-10-CM | POA: Diagnosis present

## 2014-03-08 HISTORY — PX: TOTAL KNEE ARTHROPLASTY: SHX125

## 2014-03-08 LAB — TYPE AND SCREEN
ABO/RH(D): O NEG
Antibody Screen: NEGATIVE

## 2014-03-08 SURGERY — ARTHROPLASTY, KNEE, TOTAL
Anesthesia: General | Site: Knee | Laterality: Right

## 2014-03-08 MED ORDER — RIVAROXABAN 10 MG PO TABS
10.0000 mg | ORAL_TABLET | Freq: Every day | ORAL | Status: DC
  Administered 2014-03-09 – 2014-03-11 (×3): 10 mg via ORAL
  Filled 2014-03-08 (×4): qty 1

## 2014-03-08 MED ORDER — METHOCARBAMOL 1000 MG/10ML IJ SOLN
500.0000 mg | Freq: Four times a day (QID) | INTRAVENOUS | Status: DC | PRN
  Administered 2014-03-08: 500 mg via INTRAVENOUS
  Filled 2014-03-08 (×3): qty 5

## 2014-03-08 MED ORDER — ACETAMINOPHEN 10 MG/ML IV SOLN
INTRAVENOUS | Status: DC | PRN
  Administered 2014-03-08: 1000 mg via INTRAVENOUS

## 2014-03-08 MED ORDER — CEFAZOLIN SODIUM-DEXTROSE 2-3 GM-% IV SOLR
2.0000 g | Freq: Four times a day (QID) | INTRAVENOUS | Status: AC
  Administered 2014-03-08 – 2014-03-09 (×3): 2 g via INTRAVENOUS
  Filled 2014-03-08 (×3): qty 50

## 2014-03-08 MED ORDER — LACTATED RINGERS IV SOLN: INTRAVENOUS | Status: DC

## 2014-03-08 MED ORDER — ROCURONIUM BROMIDE 100 MG/10ML IV SOLN
INTRAVENOUS | Status: AC
  Filled 2014-03-08: qty 1

## 2014-03-08 MED ORDER — HYDROMORPHONE HCL 2 MG/ML IJ SOLN
INTRAMUSCULAR | Status: AC
  Filled 2014-03-08: qty 1

## 2014-03-08 MED ORDER — PROPOFOL 10 MG/ML IV BOLUS
INTRAVENOUS | Status: AC
  Filled 2014-03-08: qty 20

## 2014-03-08 MED ORDER — HYDROCORTISONE 1 % EX CREA: TOPICAL_CREAM | Freq: Two times a day (BID) | CUTANEOUS | Status: DC

## 2014-03-08 MED ORDER — OXYCODONE-ACETAMINOPHEN 5-325 MG PO TABS: 1.0000 | ORAL_TABLET | ORAL | Status: DC | PRN

## 2014-03-08 MED ORDER — BUPROPION HCL ER (XL) 300 MG PO TB24
300.0000 mg | ORAL_TABLET | Freq: Every morning | ORAL | Status: DC
  Administered 2014-03-09 – 2014-03-11 (×3): 300 mg via ORAL
  Filled 2014-03-08 (×3): qty 1

## 2014-03-08 MED ORDER — HYDROCHLOROTHIAZIDE 12.5 MG PO CAPS
12.5000 mg | ORAL_CAPSULE | Freq: Every day | ORAL | Status: DC
  Administered 2014-03-08 – 2014-03-11 (×3): 12.5 mg via ORAL
  Filled 2014-03-08 (×4): qty 1

## 2014-03-08 MED ORDER — ROCURONIUM BROMIDE 100 MG/10ML IV SOLN
INTRAVENOUS | Status: DC | PRN
  Administered 2014-03-08: 30 mg via INTRAVENOUS

## 2014-03-08 MED ORDER — MENTHOL 3 MG MT LOZG
1.0000 | LOZENGE | OROMUCOSAL | Status: DC | PRN
  Filled 2014-03-08: qty 9

## 2014-03-08 MED ORDER — LACTATED RINGERS IV SOLN
INTRAVENOUS | Status: DC | PRN
  Administered 2014-03-08 (×2): via INTRAVENOUS

## 2014-03-08 MED ORDER — SUCCINYLCHOLINE CHLORIDE 20 MG/ML IJ SOLN
INTRAMUSCULAR | Status: DC | PRN
  Administered 2014-03-08: 80 mg via INTRAVENOUS

## 2014-03-08 MED ORDER — CLINDAMYCIN PHOSPHATE 900 MG/50ML IV SOLN
900.0000 mg | INTRAVENOUS | Status: AC
  Administered 2014-03-08: 900 mg via INTRAVENOUS

## 2014-03-08 MED ORDER — FENTANYL CITRATE 0.05 MG/ML IJ SOLN
INTRAMUSCULAR | Status: AC
  Filled 2014-03-08: qty 5

## 2014-03-08 MED ORDER — FENTANYL CITRATE 0.05 MG/ML IJ SOLN
INTRAMUSCULAR | Status: DC | PRN
  Administered 2014-03-08: 100 ug via INTRAVENOUS
  Administered 2014-03-08 (×3): 50 ug via INTRAVENOUS

## 2014-03-08 MED ORDER — MIDAZOLAM HCL 2 MG/2ML IJ SOLN
INTRAMUSCULAR | Status: AC
  Filled 2014-03-08: qty 2

## 2014-03-08 MED ORDER — ACETAMINOPHEN 325 MG PO TABS
650.0000 mg | ORAL_TABLET | Freq: Four times a day (QID) | ORAL | Status: DC | PRN
  Administered 2014-03-09 – 2014-03-11 (×3): 650 mg via ORAL
  Filled 2014-03-08 (×3): qty 2

## 2014-03-08 MED ORDER — ONDANSETRON HCL 4 MG/2ML IJ SOLN
INTRAMUSCULAR | Status: DC | PRN
Start: 2014-03-08 — End: 2014-03-08
  Administered 2014-03-08: 4 mg via INTRAVENOUS

## 2014-03-08 MED ORDER — HYDROMORPHONE HCL 1 MG/ML IJ SOLN
0.2500 mg | INTRAMUSCULAR | Status: DC | PRN
  Administered 2014-03-08 (×4): 0.5 mg via INTRAVENOUS

## 2014-03-08 MED ORDER — ACETAMINOPHEN 10 MG/ML IV SOLN
1000.0000 mg | Freq: Once | INTRAVENOUS | Status: AC
  Filled 2014-03-08: qty 100

## 2014-03-08 MED ORDER — PROMETHAZINE HCL 25 MG/ML IJ SOLN
INTRAMUSCULAR | Status: AC
  Administered 2014-03-08: 12.5 mg via INTRAVENOUS
  Filled 2014-03-08: qty 1

## 2014-03-08 MED ORDER — SODIUM CHLORIDE 0.9 % IR SOLN
Status: DC | PRN
  Administered 2014-03-08: 500 mL

## 2014-03-08 MED ORDER — LIDOCAINE HCL (CARDIAC) 20 MG/ML IV SOLN
INTRAVENOUS | Status: DC | PRN
  Administered 2014-03-08: 50 mg via INTRAVENOUS

## 2014-03-08 MED ORDER — PHENOL 1.4 % MT LIQD
1.0000 | OROMUCOSAL | Status: DC | PRN
  Filled 2014-03-08: qty 177

## 2014-03-08 MED ORDER — ONDANSETRON HCL 4 MG/2ML IJ SOLN
INTRAMUSCULAR | Status: AC
  Filled 2014-03-08: qty 2

## 2014-03-08 MED ORDER — KCL IN DEXTROSE-NACL 20-5-0.45 MEQ/L-%-% IV SOLN
INTRAVENOUS | Status: AC
  Administered 2014-03-08: 14:00:00 via INTRAVENOUS
  Filled 2014-03-08 (×3): qty 1000

## 2014-03-08 MED ORDER — OXYCODONE HCL 5 MG PO TABS
5.0000 mg | ORAL_TABLET | ORAL | Status: DC | PRN
  Administered 2014-03-08 (×2): 5 mg via ORAL
  Administered 2014-03-09 – 2014-03-11 (×15): 10 mg via ORAL
  Filled 2014-03-08: qty 1
  Filled 2014-03-08 (×2): qty 2
  Filled 2014-03-08: qty 1
  Filled 2014-03-08 (×14): qty 2

## 2014-03-08 MED ORDER — HYDROMORPHONE HCL 1 MG/ML IJ SOLN
INTRAMUSCULAR | Status: AC
  Filled 2014-03-08: qty 1

## 2014-03-08 MED ORDER — ONDANSETRON HCL 4 MG PO TABS
4.0000 mg | ORAL_TABLET | Freq: Four times a day (QID) | ORAL | Status: DC | PRN
  Administered 2014-03-09: 4 mg via ORAL
  Filled 2014-03-08: qty 1

## 2014-03-08 MED ORDER — HYDROMORPHONE HCL 1 MG/ML IJ SOLN
1.0000 mg | INTRAMUSCULAR | Status: DC | PRN
  Administered 2014-03-08 – 2014-03-10 (×8): 1 mg via INTRAVENOUS
  Filled 2014-03-08 (×8): qty 1

## 2014-03-08 MED ORDER — SODIUM CHLORIDE 0.9 % IJ SOLN
INTRAMUSCULAR | Status: AC
  Filled 2014-03-08: qty 10

## 2014-03-08 MED ORDER — ACETAMINOPHEN 650 MG RE SUPP: 650.0000 mg | Freq: Four times a day (QID) | RECTAL | Status: DC | PRN

## 2014-03-08 MED ORDER — DOCUSATE SODIUM 100 MG PO CAPS: 100.0000 mg | ORAL_CAPSULE | Freq: Two times a day (BID) | ORAL | Status: DC | PRN

## 2014-03-08 MED ORDER — ONDANSETRON HCL 4 MG/2ML IJ SOLN
4.0000 mg | Freq: Four times a day (QID) | INTRAMUSCULAR | Status: DC | PRN
  Administered 2014-03-08: 4 mg via INTRAVENOUS
  Filled 2014-03-08: qty 2

## 2014-03-08 MED ORDER — LIDOCAINE HCL (CARDIAC) 20 MG/ML IV SOLN
INTRAVENOUS | Status: AC
  Filled 2014-03-08: qty 5

## 2014-03-08 MED ORDER — BUPIVACAINE-EPINEPHRINE 0.25% -1:200000 IJ SOLN
INTRAMUSCULAR | Status: AC
  Filled 2014-03-08: qty 1

## 2014-03-08 MED ORDER — DOCUSATE SODIUM 100 MG PO CAPS
100.0000 mg | ORAL_CAPSULE | Freq: Two times a day (BID) | ORAL | Status: DC
  Administered 2014-03-08 – 2014-03-10 (×5): 100 mg via ORAL

## 2014-03-08 MED ORDER — HYDROMORPHONE HCL 1 MG/ML IJ SOLN
INTRAMUSCULAR | Status: DC | PRN
  Administered 2014-03-08: 0.5 mg via INTRAVENOUS

## 2014-03-08 MED ORDER — GLYCOPYRROLATE 0.2 MG/ML IJ SOLN
INTRAMUSCULAR | Status: AC
  Filled 2014-03-08: qty 3

## 2014-03-08 MED ORDER — METHOCARBAMOL 500 MG PO TABS
500.0000 mg | ORAL_TABLET | Freq: Four times a day (QID) | ORAL | Status: DC | PRN
  Administered 2014-03-08 – 2014-03-11 (×8): 500 mg via ORAL
  Filled 2014-03-08 (×8): qty 1

## 2014-03-08 MED ORDER — LISINOPRIL-HYDROCHLOROTHIAZIDE 10-12.5 MG PO TABS: 1.0000 | ORAL_TABLET | Freq: Every day | ORAL | Status: DC

## 2014-03-08 MED ORDER — METOCLOPRAMIDE HCL 5 MG/ML IJ SOLN
5.0000 mg | Freq: Three times a day (TID) | INTRAMUSCULAR | Status: DC | PRN
  Administered 2014-03-08 – 2014-03-09 (×2): 10 mg via INTRAVENOUS
  Filled 2014-03-08 (×2): qty 2

## 2014-03-08 MED ORDER — LISINOPRIL 10 MG PO TABS
10.0000 mg | ORAL_TABLET | Freq: Every day | ORAL | Status: DC
  Administered 2014-03-08 – 2014-03-11 (×3): 10 mg via ORAL
  Filled 2014-03-08 (×4): qty 1

## 2014-03-08 MED ORDER — POLYMYXIN B SULFATE 500000 UNITS IJ SOLR
INTRAMUSCULAR | Status: AC
  Filled 2014-03-08: qty 1

## 2014-03-08 MED ORDER — CLINDAMYCIN PHOSPHATE 900 MG/50ML IV SOLN
INTRAVENOUS | Status: AC
  Filled 2014-03-08: qty 50

## 2014-03-08 MED ORDER — DIPHENHYDRAMINE HCL 12.5 MG/5ML PO ELIX: 12.5000 mg | ORAL_SOLUTION | ORAL | Status: DC | PRN

## 2014-03-08 MED ORDER — BUPIVACAINE-EPINEPHRINE 0.25% -1:200000 IJ SOLN
INTRAMUSCULAR | Status: DC | PRN
  Administered 2014-03-08: 50 mL

## 2014-03-08 MED ORDER — DEXAMETHASONE SODIUM PHOSPHATE 10 MG/ML IJ SOLN
INTRAMUSCULAR | Status: AC
  Filled 2014-03-08: qty 1

## 2014-03-08 MED ORDER — METOCLOPRAMIDE HCL 5 MG PO TABS
5.0000 mg | ORAL_TABLET | Freq: Three times a day (TID) | ORAL | Status: DC | PRN
  Filled 2014-03-08: qty 2

## 2014-03-08 MED ORDER — CEFAZOLIN SODIUM-DEXTROSE 2-3 GM-% IV SOLR
INTRAVENOUS | Status: AC
  Filled 2014-03-08: qty 50

## 2014-03-08 MED ORDER — SODIUM CHLORIDE 0.9 % IR SOLN
Status: DC | PRN
  Administered 2014-03-08: 2000 mL

## 2014-03-08 MED ORDER — METHOCARBAMOL 500 MG PO TABS: 500.0000 mg | ORAL_TABLET | Freq: Three times a day (TID) | ORAL | Status: DC | PRN

## 2014-03-08 MED ORDER — PROMETHAZINE HCL 25 MG/ML IJ SOLN
6.2500 mg | Freq: Four times a day (QID) | INTRAMUSCULAR | Status: DC | PRN
  Administered 2014-03-08: 12.5 mg via INTRAVENOUS
  Administered 2014-03-09: 6.25 mg via INTRAVENOUS
  Filled 2014-03-08 (×2): qty 1

## 2014-03-08 MED ORDER — NEOSTIGMINE METHYLSULFATE 10 MG/10ML IV SOLN
INTRAVENOUS | Status: DC | PRN
  Administered 2014-03-08: 4 mg via INTRAVENOUS

## 2014-03-08 MED ORDER — PROMETHAZINE HCL 25 MG/ML IJ SOLN
6.2500 mg | INTRAMUSCULAR | Status: DC | PRN
  Administered 2014-03-08: 6.25 mg via INTRAVENOUS

## 2014-03-08 MED ORDER — CEFAZOLIN SODIUM-DEXTROSE 2-3 GM-% IV SOLR
2.0000 g | INTRAVENOUS | Status: AC
  Administered 2014-03-08: 2 g via INTRAVENOUS

## 2014-03-08 MED ORDER — LEVOTHYROXINE SODIUM 75 MCG PO TABS
75.0000 ug | ORAL_TABLET | Freq: Every day | ORAL | Status: DC
  Administered 2014-03-10 – 2014-03-11 (×2): 75 ug via ORAL
  Filled 2014-03-08 (×4): qty 1

## 2014-03-08 MED ORDER — RIVAROXABAN 10 MG PO TABS: 10.0000 mg | ORAL_TABLET | Freq: Every day | ORAL | Status: DC

## 2014-03-08 MED ORDER — ALUM & MAG HYDROXIDE-SIMETH 200-200-20 MG/5ML PO SUSP: 30.0000 mL | ORAL | Status: DC | PRN

## 2014-03-08 MED ORDER — RISAQUAD PO CAPS
1.0000 | ORAL_CAPSULE | Freq: Every day | ORAL | Status: DC
  Administered 2014-03-08 – 2014-03-11 (×4): 1 via ORAL
  Filled 2014-03-08 (×4): qty 1

## 2014-03-08 MED ORDER — PROPOFOL 10 MG/ML IV BOLUS
INTRAVENOUS | Status: DC | PRN
  Administered 2014-03-08: 150 mg via INTRAVENOUS

## 2014-03-08 MED ORDER — DEXAMETHASONE SODIUM PHOSPHATE 10 MG/ML IJ SOLN
INTRAMUSCULAR | Status: DC | PRN
  Administered 2014-03-08: 10 mg via INTRAVENOUS

## 2014-03-08 MED ORDER — GLYCOPYRROLATE 0.2 MG/ML IJ SOLN
INTRAMUSCULAR | Status: DC | PRN
  Administered 2014-03-08: .6 mg via INTRAVENOUS

## 2014-03-08 SURGICAL SUPPLY — 80 items
BAG ZIPLOCK 12X15 (MISCELLANEOUS) IMPLANT
BANDAGE ELASTIC 4 VELCRO ST LF (GAUZE/BANDAGES/DRESSINGS) ×3 IMPLANT
BANDAGE ELASTIC 6 VELCRO ST LF (GAUZE/BANDAGES/DRESSINGS) ×3 IMPLANT
BANDAGE ESMARK 6X9 LF (GAUZE/BANDAGES/DRESSINGS) ×1 IMPLANT
BLADE SAG 18X100X1.27 (BLADE) ×3 IMPLANT
BLADE SAW SGTL 13.0X1.19X90.0M (BLADE) ×3 IMPLANT
BNDG ESMARK 6X9 LF (GAUZE/BANDAGES/DRESSINGS) ×3
CAP KNEE TOTAL 3 SIGMA ×3 IMPLANT
CATH FOLEY LATEX FREE 16FR (CATHETERS) ×3 IMPLANT
CEMENT HV SMART SET (Cement) ×6 IMPLANT
CHLORAPREP W/TINT 26ML (MISCELLANEOUS) IMPLANT
CLOSURE WOUND 1/2 X4 (GAUZE/BANDAGES/DRESSINGS) ×2
CLOTH 2% CHLOROHEXIDINE 3PK (PERSONAL CARE ITEMS) ×3 IMPLANT
CUFF TOURN SGL QUICK 34 (TOURNIQUET CUFF) ×2
CUFF TRNQT CYL 34X4X40X1 (TOURNIQUET CUFF) ×1 IMPLANT
DECANTER SPIKE VIAL GLASS SM (MISCELLANEOUS) ×3 IMPLANT
DRAPE INCISE IOBAN 66X45 STRL (DRAPES) ×3 IMPLANT
DRAPE ORTHO SPLIT 77X108 STRL (DRAPES) ×4
DRAPE POUCH INSTRU U-SHP 10X18 (DRAPES) ×3 IMPLANT
DRAPE SHEET LG 3/4 BI-LAMINATE (DRAPES) ×3 IMPLANT
DRAPE SURG ORHT 6 SPLT 77X108 (DRAPES) ×2 IMPLANT
DRAPE U-SHAPE 47X51 STRL (DRAPES) ×3 IMPLANT
DRSG ADAPTIC 3X8 NADH LF (GAUZE/BANDAGES/DRESSINGS) IMPLANT
DRSG AQUACEL AG ADV 3.5X10 (GAUZE/BANDAGES/DRESSINGS) ×3 IMPLANT
DRSG PAD ABDOMINAL 8X10 ST (GAUZE/BANDAGES/DRESSINGS) IMPLANT
DRSG TEGADERM 4X4.75 (GAUZE/BANDAGES/DRESSINGS) IMPLANT
DURAPREP 26ML APPLICATOR (WOUND CARE) ×3 IMPLANT
ELECT REM PT RETURN 9FT ADLT (ELECTROSURGICAL) ×3
ELECTRODE REM PT RTRN 9FT ADLT (ELECTROSURGICAL) ×1 IMPLANT
EVACUATOR 1/8 PVC DRAIN (DRAIN) IMPLANT
FACESHIELD WRAPAROUND (MASK) ×15 IMPLANT
GAUZE SPONGE 2X2 8PLY STRL LF (GAUZE/BANDAGES/DRESSINGS) IMPLANT
GAUZE SPONGE 4X4 12PLY STRL (GAUZE/BANDAGES/DRESSINGS) IMPLANT
GLOVE BIOGEL PI IND STRL 6.5 (GLOVE) ×1 IMPLANT
GLOVE BIOGEL PI IND STRL 7.5 (GLOVE) ×2 IMPLANT
GLOVE BIOGEL PI IND STRL 8 (GLOVE) ×1 IMPLANT
GLOVE BIOGEL PI IND STRL 8.5 (GLOVE) ×1 IMPLANT
GLOVE BIOGEL PI INDICATOR 6.5 (GLOVE) ×2
GLOVE BIOGEL PI INDICATOR 7.5 (GLOVE) ×4
GLOVE BIOGEL PI INDICATOR 8 (GLOVE) ×2
GLOVE BIOGEL PI INDICATOR 8.5 (GLOVE) ×2
GLOVE SURG SS PI 6.5 STRL IVOR (GLOVE) ×3 IMPLANT
GLOVE SURG SS PI 7.5 STRL IVOR (GLOVE) ×6 IMPLANT
GLOVE SURG SS PI 8.0 STRL IVOR (GLOVE) ×6 IMPLANT
GOWN BRE IMP PREV XXLGXLNG (GOWN DISPOSABLE) ×3 IMPLANT
GOWN STRL REUS W/ TWL LRG LVL3 (GOWN DISPOSABLE) ×1 IMPLANT
GOWN STRL REUS W/TWL LRG LVL3 (GOWN DISPOSABLE) ×2
GOWN STRL REUS W/TWL XL LVL3 (GOWN DISPOSABLE) ×9 IMPLANT
HANDPIECE INTERPULSE COAX TIP (DISPOSABLE) ×2
IMMOBILIZER KNEE 20 (SOFTGOODS) ×3
IMMOBILIZER KNEE 20 THIGH 36 (SOFTGOODS) ×1 IMPLANT
KIT BASIN OR (CUSTOM PROCEDURE TRAY) ×3 IMPLANT
MANIFOLD NEPTUNE II (INSTRUMENTS) ×3 IMPLANT
NDL SAFETY ECLIPSE 18X1.5 (NEEDLE) ×1 IMPLANT
NEEDLE HYPO 18GX1.5 SHARP (NEEDLE) ×2
NS IRRIG 1000ML POUR BTL (IV SOLUTION) IMPLANT
PACK TOTAL JOINT (CUSTOM PROCEDURE TRAY) ×3 IMPLANT
PADDING CAST COTTON 6X4 STRL (CAST SUPPLIES) IMPLANT
PEN SKIN MARKING BROAD (MISCELLANEOUS) ×3 IMPLANT
POSITIONER SURGICAL ARM (MISCELLANEOUS) ×3 IMPLANT
SET HNDPC FAN SPRY TIP SCT (DISPOSABLE) ×1 IMPLANT
SPONGE GAUZE 2X2 STER 10/PKG (GAUZE/BANDAGES/DRESSINGS)
SPONGE SURGIFOAM ABS GEL 100 (HEMOSTASIS) IMPLANT
STAPLER VISISTAT (STAPLE) IMPLANT
STRIP CLOSURE SKIN 1/2X4 (GAUZE/BANDAGES/DRESSINGS) ×4 IMPLANT
SUCTION FRAZIER 12FR DISP (SUCTIONS) ×3 IMPLANT
SUT BONE WAX W31G (SUTURE) ×3 IMPLANT
SUT MNCRL AB 4-0 PS2 18 (SUTURE) ×3 IMPLANT
SUT VIC AB 1 CT1 27 (SUTURE) ×2
SUT VIC AB 1 CT1 27XBRD ANTBC (SUTURE) ×1 IMPLANT
SUT VIC AB 2-0 CT1 27 (SUTURE) ×6
SUT VIC AB 2-0 CT1 TAPERPNT 27 (SUTURE) ×3 IMPLANT
SUT VLOC 180 0 24IN GS25 (SUTURE) ×3 IMPLANT
SYR 50ML LL SCALE MARK (SYRINGE) ×3 IMPLANT
TOWEL OR 17X26 10 PK STRL BLUE (TOWEL DISPOSABLE) ×3 IMPLANT
TOWEL OR NON WOVEN STRL DISP B (DISPOSABLE) ×3 IMPLANT
TOWER CARTRIDGE SMART MIX (DISPOSABLE) ×3 IMPLANT
WATER STERILE IRR 1500ML POUR (IV SOLUTION) ×3 IMPLANT
WRAP KNEE MAXI GEL POST OP (GAUZE/BANDAGES/DRESSINGS) ×3 IMPLANT
YANKAUER SUCT BULB TIP 10FT TU (MISCELLANEOUS) ×3 IMPLANT

## 2014-03-08 NOTE — Transfer of Care (Signed)
Immediate Anesthesia Transfer of Care Note  Patient: [name]  Procedure(s) Performed: Procedure(s) (LRB): RIGHT TOTAL KNEE ARTHROPLASTY (Right)  Patient Location: PACU  Anesthesia Type: General  Level of Consciousness: sedated, patient cooperative and responds to stimulation  Airway & Oxygen Therapy: Patient Spontanous Breathing and Patient connected to face mask oxgen  Post-op Assessment: Report given to PACU RN and Post -op Vital signs reviewed and stable  Post vital signs: Reviewed and stable  Complications: No apparent anesthesia complications

## 2014-03-08 NOTE — Anesthesia Procedure Notes (Signed)
Procedure Name: Intubation Date/Time: 03/08/2014 7:42 AM Performed by: Paris LoreBLANTON, Kemal Amores M Pre-anesthesia Checklist: Patient identified, Emergency Drugs available, Suction available, Patient being monitored and Timeout performed Patient Re-evaluated:Patient Re-evaluated prior to inductionOxygen Delivery Method: Circle system utilized Preoxygenation: Pre-oxygenation with 100% oxygen Intubation Type: IV induction Ventilation: Mask ventilation without difficulty Laryngoscope Size: Mac and 4 Grade View: Grade I Tube type: Oral Tube size: 7.5 mm Number of attempts: 1 Airway Equipment and Method: Stylet Placement Confirmation: ETT inserted through vocal cords under direct vision,  positive ETCO2,  CO2 detector and breath sounds checked- equal and bilateral Secured at: 20 cm Tube secured with: Tape Dental Injury: Teeth and Oropharynx as per pre-operative assessment

## 2014-03-08 NOTE — Progress Notes (Signed)
Utilization review completed.  

## 2014-03-08 NOTE — Progress Notes (Signed)
X-ray results noted 

## 2014-03-08 NOTE — Interval H&P Note (Signed)
History and Physical Interval Note:  03/08/2014 7:26 AM  [name]  has presented today for surgery, with the diagnosis of Degenerative joint disease RIGHT KNEE  The various methods of treatment have been discussed with the patient and family. After consideration of risks, benefits and other options for treatment, the patient has consented to  Procedure(s): RIGHT TOTAL KNEE ARTHROPLASTY (Right) as a surgical intervention .  The patient's history has been reviewed, patient examined, no change in status, stable for surgery.  I have reviewed the patient's chart and labs.  Questions were answered to the patient's satisfaction.     [name] C

## 2014-03-08 NOTE — H&P (View-Only) (Signed)
[name] DOB: 05-05-1955 Divorced / Language: English / Race: White Female  H&P Date: 02/27/14  Chief Complaint: R knee pain  History of Present Illness  The patient is a 59 year old female who comes in today for a preoperative History and Physical. The patient is scheduled for a right total knee arthroplasty to be performed by Dr. Javier DockerJeffrey C. Beane, MD [facility] on 03/08/14. Archie Pattenonya reports years of B/L knee pain, progressively worsening, now interfering with ADL's, refractory to steroid injections, pain medications, NSAIDs, quad strengthening, HEP, bracing, activity modifications, and relative rest.  Dr. Shelle IronBeane and the patient mutually agreed to proceed with a total knee replacement. Risks and benefits of the procedure were discussed including stiffness, suboptimal range of motion, persistent pain, infection requiring removal of prosthesis and reinsertion, need for prophylactic antibiotics in the future, for example, dental procedures, possible need for manipulation, revision in the future and also anesthetic complications including DVT, PE, etc. We discussed the perioperative course, time in the hospital, postoperative recovery and the need for elevation to control swelling. We also discussed the predicted range of motion and the probability that squatting and kneeling would be unobtainable in the future. In addition, postoperative anticoagulation was discussed. We will obtain preoperative medical clearance if necessary. Provided the patient with illustrated handout and discussed it in detail. They will enroll in the total joint replacement educational forum at the hospital.  Her [facility] pre-op is tomorrow. Her daughters have already purchased her a walker, transfer shower seat, removable shower head, and will be buying a raised commode seat as well. She has been taking Norco for pain, is unsure if she tolerates Percocet, ES Norco has made her sick in the past.  Problem List/Past Medical  Hx Depression Hypothyroidism Cellulitis L leg 11/2013  Allergies Zinc *MULTIVITAMINS* Latex Dermatitis.  Family History  Cancer Father, Brother, Sister. Father deceased, lung CA Mother Living. age 57 Siblings one brother and one sister living both healthy. one brother deceased age 204- leukemia. one sister deceased age 57- brain tumor. Children In good health. 4 daughters  Social History  Tobacco use Former smoker. 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 working full time Marital status Divorced. Current occupation Personal [employer] Post-S[employer] Plans home with HHPT. one level home with 2 steps to enter Advance Directives none  Medication History Norco (5-325MG  Tablet, 1 (one) Oral 1 TAB PO Q 8 HRS PRN PAIN Tylenol Extra Strength (500MG  Tablet, Oral) Active. Synthroid (75MCG Tablet, Oral) Active. Lisinopril-Hydrochlorothiazide (10-12.5MG  Tablet, Oral) Active. BuPROPion HCl ER (XL) (300MG  Tablet ER 24HR, Oral) Active. Vitamin D (Ergocalciferol) (50000UNIT Capsule, Oral) Active. Aspirin (81MG  Capsule, 1 (one) Oral) Active. Ibuprofen (800MG  Tablet, 1 (one) Oral) Active. Acetaminophen ER (650MG  Tablet ER, Oral) Active. Medications Reconciled  Past Surgical History Cesarean Delivery 4 Arthroscopic Knee Surgery - Both post-op N/V Hysterectomy  Review of Systems General Not Present- Chills, Fatigue, Fever, Memory Loss, Night Sweats, Weight Gain and Weight Loss. Skin Not Present- Eczema, Hives, Itching, Lesions and Rash. HEENT Not Present- Dentures, Double Vision, Headache, Hearing Loss, Tinnitus and Visual Loss. Respiratory Not Present- Allergies, Chronic Cough, Coughing up blood,  Shortness of breath at rest and Shortness of breath with exertion. Cardiovascular Not Present- Chest Pain, Difficulty Breathing Lying Down,  Murmur, Palpitations, Racing/skipping heartbeats and Swelling. Gastrointestinal Not Present- Abdominal Pain, Bloody Stool, Constipation, Diarrhea, Difficulty Swallowing, Heartburn, Jaundice, Loss of appetitie, Nausea and Vomiting. Female Genitourinary Not Present- Blood in Urine, Discharge, Flank Pain, Incontinence, Painful Urination, Urgency, Urinary frequency, Urinary Retention, Urinating at Night and Weak urinary stream. Musculoskeletal Present- Joint Pain, Joint Stiffness, Joint Swelling, Morning Stiffness and Muscle Pain. Not Present- Back Pain, Muscle Weakness and Spasms. Neurological Not Present- Blackout spells, Difficulty with balance, Dizziness, Paralysis, Tremor and Weakness. Psychiatric Not Present- Insomnia.  Physical Exam General Mental Status -Alert, cooperative and good historian. General Appearance-pleasant, Not in acute distress. Orientation-Oriented X3. Build & Nutrition-Well nourished and Well developed.  Head and Neck Head-normocephalic, atraumatic . Neck Global Assessment - supple, no bruit auscultated on the right, no bruit auscultated on the left.  Eye Pupil - Bilateral-Regular and Round. Motion - Bilateral-EOMI.  Chest and Lung Exam Auscultation Breath sounds - clear at anterior chest wall and clear at posterior chest wall. Adventitious sounds - No Adventitious sounds.  Cardiovascular Auscultation Rhythm - Regular rate and rhythm. Heart Sounds - S1 WNL and S2 WNL. Murmurs & Other Heart Sounds - Auscultation of the heart reveals - No Murmurs.  Abdomen Palpation/Percussion Tenderness - Abdomen is non-tender to palpation. Rigidity (guarding) - Abdomen is soft. Auscultation Auscultation of the abdomen reveals - Bowel sounds normal.  Female Genitourinary Not done, not pertinent to present  illness  Musculoskeletal Note: Right knee exam on inspection reveals no evidence of ecchymosis, deformity or erythema. She has a varus thrust, varus deformity to the right. Range is -3 to 90. She is tender to the medial joint line. Patellofemoral pain to compression. Trace effusion. Nontender over the fibular head or the peroneal nerve. Nontender over the quadriceps insertion of the patellar ligament insertion. The range of motion was full. Provocative maneuvers revealed a negative Lachman, negative anterior and posterior drawer and a negative McMurray. No instability was noted with varus and valgus stressing at 0 or 30 degrees. On manual motor test the quadriceps and hamstrings were 5/5. Sensory exam was intact to light touch.  Imaging AP standing and lateral demonstrates bone on bone arthrosis in the medial compartment. There is an increase in her varus deformity.  Assessment & Plan Primary osteoarthritis of right knee (M17.11)  Pt with end-stage R knee DJD, bone-on-bone, refractory to conservative tx, scheduled for R total knee replacement by Dr. Shelle Iron on 03/08/14. We again discussed the procedure itself as well as risks, complications and alternatives, including but not limited to DVT, PE, infx, bleeding, failure of procedure, need for secondary procedure including manipulation, nerve injury, ongoing pain/symptoms, anesthesia risk, even stroke or death. Also discussed typical post-op protocols, activity restrictions, need for PT, flexion/extension exercises, time out of work. Discussed need for DVT ppx post-op with Xarelto then ASA per protocol. Discussed dental ppx. Also discussed limitations post-operatively such as kneeling and squatting. All questions were answered. Patient desires to proceed with surgery as scheduled. Will hold ASA and NSAIDs accordingly. Will remain NPO after MN night before surgery. Will present to [facility] for pre-op testing. Plan Xarelto 2 weeks post-op for DVT ppx then full dose  ASA before returning to her normal  ASA. Plan pain medication (ES Norco or Percocet) with anti-emetic, Robaxin, Colace. Plan home with HHPT post-op with family members at home for assistance. Will follow up 10-14 days post-op for suture removal and xrays. Rx's given for her DME items, also discussed transitioning from walker to cane during her PT, Rx for  cane given as well.  Plan R total knee replacement  Signed electronically by Andrez Grime, PA-C for Dr. Shelle Iron

## 2014-03-08 NOTE — Evaluation (Signed)
Physical Therapy Evaluation Patient Details Name: [name] MRN: [id number] DOB: Mar 25, 1955 Today's Date: 03/08/2014   History of Present Illness  R TKR  Clinical Impression  Pt s/p R TKR presents with decreased R LE strength/ROM and post op pain and nausea limiting functional mobility.  Pt should progress to d/c home with family assist and HHPT follow up.    Follow Up Recommendations Home health PT    Equipment Recommendations  None recommended by PT    Recommendations for Other Services OT consult     Precautions / Restrictions Precautions Precautions: Knee;Fall Required Braces or Orthoses: Knee Immobilizer - Right Knee Immobilizer - Right: Discontinue once straight leg raise with < 10 degree lag Restrictions Weight Bearing Restrictions: No Other Position/Activity Restrictions: WBAT      Mobility  Bed Mobility               General bed mobility comments: Deferred - increased nausea with attempt to sit up  Transfers                    Ambulation/Gait                Stairs            Wheelchair Mobility    Modified Rankin (Stroke Patients Only)       Balance                                             Pertinent Vitals/Pain Pain Assessment: 0-10 Pain Score: 6  Pain Location: R knee Pain Descriptors / Indicators: Aching;Sore Pain Intervention(s): Limited activity within patient's tolerance;Monitored during session;Premedicated before session;Ice applied    Home Living Family/patient expects to be discharged to:: Private residence Living Arrangements: Children Available Help at Discharge: Family Type of Home: House Home Access: Stairs to enter Entrance Stairs-Rails: Right;Left;Can reach both Entrance Stairs-Number of Steps: 2 Home Layout: One level Home Equipment: Environmental consultantWalker - 2 wheels;Cane - single point      Prior Function Level of Independence: Independent               Hand Dominance    Dominant Hand: Right    Extremity/Trunk Assessment   Upper Extremity Assessment: Overall WFL for tasks assessed           Lower Extremity Assessment: RLE deficits/detail RLE Deficits / Details: 2/5 quads with AAROM at knee -10 - 55    Cervical / Trunk Assessment: Normal  Communication   Communication: No difficulties  Cognition Arousal/Alertness: Awake/alert Behavior During Therapy: WFL for tasks assessed/performed Overall Cognitive Status: Within Functional Limits for tasks assessed                      General Comments      Exercises Total Joint Exercises Ankle Circles/Pumps: AROM;Both;15 reps;Supine Quad Sets: AROM;Both;10 reps;Supine Heel Slides: AAROM;Right;10 reps;Supine Straight Leg Raises: AAROM;Right;10 reps;Supine      Assessment/Plan    PT Assessment Patient needs continued PT services  PT Diagnosis Difficulty walking   PT Problem List Decreased strength;Decreased range of motion;Decreased activity tolerance;Decreased mobility;Decreased knowledge of use of DME;Pain  PT Treatment Interventions DME instruction;Gait training;Stair training;Functional mobility training;Therapeutic activities;Therapeutic exercise;Patient/family education   PT Goals (Current goals can be found in the Care Plan section) Acute Rehab PT Goals Patient Stated Goal: Resume previous lifestyle with decreased pain PT  Goal Formulation: With patient Time For Goal Achievement: 03/15/14 Potential to Achieve Goals: Good    Frequency 7X/week   Barriers to discharge        Co-evaluation               End of Session   Activity Tolerance: Other (comment) (nausea) Patient left: in bed;with call bell/phone within reach;with family/visitor present Nurse Communication: Mobility status;Other (comment)         Time: 1610-9604 PT Time Calculation (min) (ACUTE ONLY): 18 min   Charges:   PT Evaluation $Initial PT Evaluation Tier I: 1 Procedure     PT G Codes:         Walther Sanagustin 03/08/2014, 4:52 PM

## 2014-03-08 NOTE — Anesthesia Postprocedure Evaluation (Signed)
  Anesthesia Post-op Note  Patient: [name]  Procedure(s) Performed: Procedure(s) (LRB): RIGHT TOTAL KNEE ARTHROPLASTY (Right)  Patient Location: PACU  Anesthesia Type: General  Level of Consciousness: awake and alert   Airway and Oxygen Therapy: Patient Spontanous Breathing  Post-op Pain: mild  Post-op Assessment: Post-op Vital signs reviewed, Patient's Cardiovascular Status Stable, Respiratory Function Stable, Patent Airway and No signs of Nausea or vomiting  Last Vitals:  Filed Vitals:   03/08/14 1339  BP: 113/58  Pulse: 64  Temp: 36.5 C  Resp: 12    Post-op Vital Signs: stable   Complications: No apparent anesthesia complications

## 2014-03-08 NOTE — Brief Op Note (Signed)
03/08/2014  9:19 AM  PATIENT:  [name]  59 y.o. female  PRE-OPERATIVE DIAGNOSIS:  Degenerative joint disease RIGHT KNEE  POST-OPERATIVE DIAGNOSIS:  Degenerative joint disease RIGHT KNEE  PROCEDURE:  Procedure(s): RIGHT TOTAL KNEE ARTHROPLASTY (Right)  SURGEON:  Surgeon(s) and Role:    * Javier DockerJeffrey C Daryan Buell, MD - Primary  PHYSICIAN ASSISTANT:   ASSISTANTS: Bissell   ANESTHESIA:   general  EBL:  Total I/O In: 1000 [I.V.:1000] Out: 200 [Urine:200]  BLOOD ADMINISTERED:none  DRAINS: none   LOCAL MEDICATIONS USED:  MARCAINE     SPECIMEN:  No Specimen  DISPOSITION OF SPECIMEN:  N/A  COUNTS:  YES  TOURNIQUET:  * Missing tourniquet times found for documented tourniquets in log:  [id number] *  DICTATION: .Other Dictation: Dictation Number [id number]  PLAN OF CARE: Admit to inpatient   PATIENT DISPOSITION:  PACU - hemodynamically stable.   Delay start of Pharmacological VTE agent (>24hrs) due to surgical blood loss or risk of bleeding: no

## 2014-03-08 NOTE — Progress Notes (Signed)
Portable AP and Lateral Right Knee X-RAYS Done. 

## 2014-03-09 ENCOUNTER — Encounter (HOSPITAL_COMMUNITY): Payer: Self-pay | Admitting: Specialist

## 2014-03-09 LAB — CBC
HEMATOCRIT: 33.7 % — AB (ref 36.0–46.0)
Hemoglobin: 11.2 g/dL — ABNORMAL LOW (ref 12.0–15.0)
MCH: 30.9 pg (ref 26.0–34.0)
MCHC: 33.2 g/dL (ref 30.0–36.0)
MCV: 93.1 fL (ref 78.0–100.0)
PLATELETS: 162 10*3/uL (ref 150–400)
RBC: 3.62 MIL/uL — ABNORMAL LOW (ref 3.87–5.11)
RDW: 14 % (ref 11.5–15.5)
WBC: 11.3 10*3/uL — AB (ref 4.0–10.5)

## 2014-03-09 LAB — BASIC METABOLIC PANEL
Anion gap: 6 (ref 5–15)
BUN: 9 mg/dL (ref 6–23)
CALCIUM: 8.7 mg/dL (ref 8.4–10.5)
CO2: 27 mmol/L (ref 19–32)
CREATININE: 0.8 mg/dL (ref 0.50–1.10)
Chloride: 102 mmol/L (ref 96–112)
GFR calc non Af Amer: 81 mL/min — ABNORMAL LOW (ref >90)
Glucose, Bld: 135 mg/dL — ABNORMAL HIGH (ref 70–99)
Potassium: 3.8 mmol/L (ref 3.5–5.1)
SODIUM: 135 mmol/L (ref 135–145)

## 2014-03-09 NOTE — Op Note (Signed)
[name], [name]                  ACCOUNT NO.:  [id number]  MEDICAL RECORD NO.:  [id number]  LOCATION:  1607                         FACILITY:  [facility]  PHYSICIAN:  Jene Every, M.D.    DATE OF BIRTH:  Jun 01, 1955  DATE OF PROCEDURE:  03/08/2014 DATE OF DISCHARGE:                              OPERATIVE REPORT   PREOPERATIVE DIAGNOSIS:  End-stage osteoarthritis of the right knee.  POSTOPERATIVE DIAGNOSES:  End-stage osteoarthritis of the right knee, degenerative joint disease.  PROCEDURE PERFORMED:  Right total knee arthroplasty.  ANESTHESIA:  General.  ASSISTANT:  Lanna Poche, PA.  COMPONENTS:  DePuy rotating platform, 3 femur, 3 tibia, 10 mm insert, 38 patella.  HISTORY:  A 59 year old, bone-on-bone arthrosis medial compartment right knee.  Refractory to conservative treatment, severe, affect her activities of daily living, indicated for replacement of the degenerated joint.  Risks and benefits discussed including bleeding, infection, damage to neurovascular structures, DVT, PE, anesthetic complications, suboptimal range of motion, etc.  TECHNIQUE:  With the patient in a supine position, after the induction of adequate general anesthesia, 2 g Kefzol and 900 clindamycin, the right lower extremity was prepped, draped, and exsanguinated in usual sterile fashion.  Thigh tourniquet inflated to 275 mmHg.  Midline incision was then made over the right knee after time-out was performed. Full-thickness flaps developed.  Medial parapatellar arthrotomy performed.  Soft tissues elevated medially preserving the MCL.  Knee was flexed.  Tricompartmental osteoarthrosis, bone on bone was noted. Medial compartment, patellofemoral joint, remnants of medial and lateral menisci were removed, cauterized the geniculates.  Step drill was utilized to enter the femoral canal, was irrigated, 5-degree right was used, 11 off the distal femur.  Due to the flexion contracture, this was pinned.   With an oscillating saw, I performed a cut, protected soft tissues at all times throughout the case.  We then sized the femur off the anterior cortex to a 3.5, we felt 4 did not resect enough of the bone, we pinned it at 3.5, performed our cut, then we felt we could move it posteriorly about 2 mm, we did that, and then pinned it and performed our anterior, posterior, and chamfer cuts.  We did not notch the femur. Adequate resection was obtained.  Again protecting the soft tissues posteriorly at all times.  Subluxed the tibia, external alignment guide 2 off the defect, which was medially at the physeal line.  External alignment guide was used bisecting the tibiotalar joint parallel to the tibial shaft, slight slope, pinned it, performed our cut again protecting the soft tissues posteriorly at all times.  We measured our flexion/extension gaps with a block, they were equivalent.  We then turned attention towards completing the tibia, it was subluxed with McCullough retractors measured to a 3.  We used our template, pinned 3 just to the medial aspect of the tibial tubercle.  Drilled our central pilot hole, used our punch guide.  Attention towards the femur, performed our box cut.  We used a box cut jig, flushed laterally bisecting the canal, pinned and performed that cut.  We then used a trial femur and tibia 3 with a 10 mm insert.  We  had full extension, full flexion, and good patellofemoral tracking.  We then completed the patella, measured at a 23, we planed it to a 15, it was a 38.  We drilled our PEG holes medializing them and placed a trial patellar button.  We had excellent patellofemoral tracking.  All trials were then removed.  We checked posteriorly, popliteus was intact as was the capsule.  No residual debris was noted.  Copiously irrigated the joint with pulsatile lavage, flexed the knee, all surfaces fully dried.  Mixed the cement on the back table in usual fashion, injected it  into the tibial canal.  We digitally pressurized the cement.  We then impacted the tibial tray #3, redundant cement removed.  We cemented the femur, impacted it, redundant cement removed.  We placed the trial 10, reduced it, and held an axial load to the knee throughout the case in extension. We then cemented the patellar component as well.  After full curing of the cement, we meticulously removed redundant cement.  We had full flexion to 140, full extension, and good stability of varus and valgus stressing 0-30 degrees.  Excellent patellofemoral tracking.  We removed the insert, again meticulously checked and removed any redundant cement. Copiously irrigated with pulsatile lavage and antibiotic irrigation and then placed a 10 mm insert, again full extension and flexion and good stability with varus and valgus stressing 0-30 degrees and excellent patellofemoral tracking.  Bone wax placed on the cancellous surfaces, which were few.  We elected not to use a Hemovac drain as the capsule was intact.  No cancellous surfaces.  Marcaine with epinephrine was then infiltrated in the periarticular tissues.  We then reapproximated the patellar arthrotomy with 1-Vicryl, and then a running V-Loc in slight flexion.  Copiously irrigated the subcutaneous tissue.  Multiple 2-0 in the subcutaneous adipose tissue and skin with 4-0 subcuticular Monocryl. Sterile dressing applied.  She had flexion to gravity at 90 degrees and full flexion, extension, good stability with varus valgus stressing 0-30 degrees.  Sterile dressing applied.  Tourniquet deflated, and there was adequate revascularization of the lower extremity appreciated.  The patient tolerated the procedure well.  No complications.  Minimal blood loss.  Tourniquet time 90 minutes.  Again, assistant, Lanna PocheJacqueline Bissell was used throughout the case for positioning, closure, and exposure, absolutely necessary.     Jene EveryJeffrey Younique Casad,  M.D.     Cordelia PenJB/MEDQ  D:  03/08/2014  T:  03/09/2014  Job:  [id number]

## 2014-03-09 NOTE — Progress Notes (Signed)
Physical Therapy Treatment Patient Details Name: [name] MRN: [id number] DOB: 1955-10-30 Today's Date: 03/09/2014    History of Present Illness R TKR    PT Comments    Progressed to OOB and ltd ambulation.  Therex deferred 2* pain level this am.  No c/o dizziness or nausea this session.  Follow Up Recommendations  Home health PT     Equipment Recommendations  None recommended by PT    Recommendations for Other Services OT consult     Precautions / Restrictions Precautions Precautions: Knee;Fall Required Braces or Orthoses: Knee Immobilizer - Right Knee Immobilizer - Right: Discontinue once straight leg raise with < 10 degree lag Restrictions Weight Bearing Restrictions: No Other Position/Activity Restrictions: WBAT    Mobility  Bed Mobility Overal bed mobility: Needs Assistance Bed Mobility: Supine to Sit     Supine to sit: Min assist     General bed mobility comments: cues for sequence; assist with RLE.  Pt used bedrail  Transfers Overall transfer level: Needs assistance Equipment used: Rolling walker (2 wheeled) Transfers: Sit to/from Stand Sit to Stand: Min assist;Mod assist         General transfer comment: cues for LE management and use of UEs to self assist  Ambulation/Gait Ambulation/Gait assistance: Min assist Ambulation Distance (Feet): 28 Feet Assistive device: Rolling walker (2 wheeled) Gait Pattern/deviations: Step-to pattern;Decreased step length - right;Decreased step length - left;Shuffle;Trunk flexed Gait velocity: decr   General Gait Details: cues for sequence, posture and position from Rohm [employer]W   Stairs            Wheelchair Mobility    Modified Rankin (Stroke Patients Only)       Balance                                    Cognition Arousal/Alertness: Awake/alert Behavior During Therapy: WFL for tasks assessed/performed Overall Cognitive Status: Within Functional Limits for tasks assessed                       Exercises      General Comments        Pertinent Vitals/Pain Pain Assessment: 0-10 Pain Score: 8  Pain Location: R knee Pain Descriptors / Indicators: Aching;Sore Pain Intervention(s): Limited activity within patient's tolerance;Monitored during session;Premedicated before session;Ice applied    Home Living                      Prior Function            PT Goals (current goals can now be found in the care plan section) Acute Rehab PT Goals Patient Stated Goal: Resume previous lifestyle with decreased pain PT Goal Formulation: With patient Time For Goal Achievement: 03/15/14 Potential to Achieve Goals: Good Progress towards PT goals: Progressing toward goals    Frequency  7X/week    PT Plan Current plan remains appropriate    Co-evaluation             End of Session Equipment Utilized During Treatment: Gait belt;Right knee immobilizer Activity Tolerance: Patient tolerated treatment well;Patient limited by pain Patient left: in chair;with call bell/phone within reach;with family/visitor present     Time: 1213-1233 PT Time Calculation (min) (ACUTE ONLY): 20 min  Charges:  $Gait Training: 8-22 mins                    G  Codes:      Avamarie Crossley 03/09/2014, 1:16 PM

## 2014-03-09 NOTE — Progress Notes (Signed)
Patient ID: [name], female   DOB: Sep 06, 1955, 59 y.o.   MRN: [id number] Subjective: 1 Day Post-Op Procedure(s) (LRB): RIGHT TOTAL KNEE ARTHROPLASTY (Right) Patient reports pain as moderate.   Seen in AM rounds for Dr. Shelle Iron. 2 of her daughters are here with her. Patient has complaints of R knee pain. Her nausea is improved from yesterday, not much appetite for breakfast. Tolerated some PT yesterday, limited by nausea.  We will start therapy today. Plan is to go home with HHPT after hospital stay.  Objective: Vital signs in last 24 hours: Temp:  [97.4 F (36.3 C)-99.2 F (37.3 C)] 99.2 F (37.3 C) (02/19 0620) Pulse Rate:  [62-102] 66 (02/19 0620) Resp:  [9-20] 20 (02/19 0620) BP: (101-130)/(52-68) 101/52 mmHg (02/19 0620) SpO2:  [96 %-100 %] 98 % (02/19 0620) Weight:  [88.451 kg (195 lb)] 88.451 kg (195 lb) (02/18 1403)  Intake/Output from previous day:  Intake/Output Summary (Last 24 hours) at 03/09/14 0740 Last data filed at 03/09/14 0620  Gross per 24 hour  Intake 4717.5 ml  Output   3850 ml  Net  867.5 ml    Intake/Output this shift:    Labs: Results for orders placed or performed during the hospital encounter of 03/08/14  CBC  Result Value Ref Range   WBC 11.3 (H) 4.0 - 10.5 K/uL   RBC 3.62 (L) 3.87 - 5.11 MIL/uL   Hemoglobin 11.2 (L) 12.0 - 15.0 g/dL   HCT 08.6 (L) 57.8 - 46.9 %   MCV 93.1 78.0 - 100.0 fL   MCH 30.9 26.0 - 34.0 pg   MCHC 33.2 30.0 - 36.0 g/dL   RDW 62.9 52.8 - 41.3 %   Platelets 162 150 - 400 K/uL  Basic metabolic panel  Result Value Ref Range   Sodium 135 135 - 145 mmol/L   Potassium 3.8 3.5 - 5.1 mmol/L   Chloride 102 96 - 112 mmol/L   CO2 27 19 - 32 mmol/L   Glucose, Bld 135 (H) 70 - 99 mg/dL   BUN 9 6 - 23 mg/dL   Creatinine, Ser 2.44 0.50 - 1.10 mg/dL   Calcium 8.7 8.4 - 01.0 mg/dL   GFR calc non Af Amer 81 (L) >90 mL/min   GFR calc Af Amer >90 >90 mL/min   Anion gap 6 5 - 15    Exam - Neurologically intact ABD  soft Neurovascular intact Sensation intact distally Intact pulses distally Dorsiflexion/Plantar flexion intact Incision: dressing C/D/I and no drainage No cellulitis present Compartment soft no sign of DVT Dressing - clean, dry, no drainage Motor function intact - moving foot and toes well on exam.   Assessment/Plan: 1 Day Post-Op Procedure(s) (LRB): RIGHT TOTAL KNEE ARTHROPLASTY (Right)  Advance diet Up with therapy D/C IV fluids Past Medical History  Diagnosis Date  . Unspecified hypothyroidism   . Obesity   . Menopause 2002  . Hyperlipidemia 04/2004  . Hemorrhoids, internal 03/2002  . Colon polyp 6/09    Dr. Loreta Ave (no path report in chart)  . Hypertension   . History of mammogram 08/2010    normal screening  . Vitamin D deficiency 11/2010  . Normal cardiac stress test 08/19/04    normal 2D echo and cardiolite perfusion study; Dr. Aleen Campi  . Sleep disturbance 06/04/2004    normal sleep study  . PONV (postoperative nausea and vomiting)   . Arthritis     DVT Prophylaxis - Xarelto Protocol Weight-Bearing as tolerated to R leg No vaccines.  Up with PT today Plan D/C home with HHPT when ready, Saturday vs. Sunday depending on pain and progress with PT Will discuss with Dr. Elissa LovettBeane  BISSELL, Dayna BarkerJACLYN M. 03/09/2014, 7:40 AM

## 2014-03-09 NOTE — Discharge Instructions (Signed)
Elevate leg above heart 6x a day for 20minutes each °Use knee immobilizer while walking until can SLR x 10 °Use knee immobilizer in bed to keep knee in extension °Aquacel dressing may remain in place until follow up. May shower with aquacel dressing in place. If the dressing becomes saturated or peels off, you may remove aquacel dressing. Do not remove steri-strips if they are present. Place new dressing with gauze and tape or ACE bandage which should be kept clean and dry and changed daily. °Information on my medicine - XARELTO® (Rivaroxaban) ° °This medication education was reviewed with me or my healthcare representative as part of my discharge preparation.  The pharmacist that spoke with me during my hospital stay was:  Meggin Ola E, RPH ° °Why was Xarelto® prescribed for you? °Xarelto® was prescribed for you to reduce the risk of blood clots forming after orthopedic surgery. The medical term for these abnormal blood clots is venous thromboembolism (VTE). ° °What do you need to know about xarelto® ? °Take your Xarelto® ONCE DAILY at the same time every day. °You may take it either with or without food. ° °If you have difficulty swallowing the tablet whole, you may crush it and mix in applesauce just prior to taking your dose. ° °Take Xarelto® exactly as prescribed by your doctor and DO NOT stop taking Xarelto® without talking to the doctor who prescribed the medication.  Stopping without other VTE prevention medication to take the place of Xarelto® may increase your risk of developing a clot. ° °After discharge, you should have regular check-up appointments with your healthcare provider that is prescribing your Xarelto®.   ° °What do you do if you miss a dose? °If you miss a dose, take it as soon as you remember on the same day then continue your regularly scheduled once daily regimen the next day. Do not take two doses of Xarelto® on the same day.  ° °Important Safety Information °A possible side effect of  Xarelto® is bleeding. You should call your healthcare provider right away if you experience any of the following: °? Bleeding from an injury or your nose that does not stop. °? Unusual colored urine (red or dark brown) or unusual colored stools (red or black). °? Unusual bruising for unknown reasons. °? A serious fall or if you hit your head (even if there is no bleeding). ° °Some medicines may interact with Xarelto® and might increase your risk of bleeding while on Xarelto®. To help avoid this, consult your healthcare provider or pharmacist prior to using any new prescription or non-prescription medications, including herbals, vitamins, non-steroidal anti-inflammatory drugs (NSAIDs) and supplements. ° °This website has more information on Xarelto®: www.xarelto.com. ° ° °

## 2014-03-09 NOTE — Progress Notes (Signed)
Physical Therapy Treatment Patient Details Name: [name] MRN: [id number] DOB: 08-29-1955 Today's Date: 03/09/2014    History of Present Illness R TKR    PT Comments    Pt progressing slowly - pain ltd  Follow Up Recommendations  Home health PT     Equipment Recommendations  None recommended by PT    Recommendations for Other Services OT consult     Precautions / Restrictions Precautions Precautions: Knee;Fall Required Braces or Orthoses: Knee Immobilizer - Right Knee Immobilizer - Right: Discontinue once straight leg raise with < 10 degree lag Restrictions Weight Bearing Restrictions: No Other Position/Activity Restrictions: WBAT    Mobility  Bed Mobility Overal bed mobility: Needs Assistance Bed Mobility: Sit to Supine       Sit to supine: Min assist   General bed mobility comments: cues for sequence; assist with RLE.  Pt used bedrail  Transfers Overall transfer level: Needs assistance Equipment used: Rolling walker (2 wheeled) Transfers: Sit to/from Stand Sit to Stand: Min assist;Mod assist         General transfer comment: cues for LE management and use of UEs to self assist  Ambulation/Gait Ambulation/Gait assistance: Min assist Ambulation Distance (Feet): 32 Feet Assistive device: Rolling walker (2 wheeled) Gait Pattern/deviations: Step-to pattern;Decreased step length - right;Decreased step length - left;Shuffle;Trunk flexed Gait velocity: decr   General Gait Details: cues for sequence, posture and position from [employer]W   Stairs            Wheelchair Mobility    Modified Rankin (Stroke Patients Only)       Balance                                    Cognition Arousal/Alertness: Awake/alert Behavior During Therapy: WFL for tasks assessed/performed Overall Cognitive Status: Within Functional Limits for tasks assessed                      Exercises Total Joint Exercises Ankle Circles/Pumps: AROM;Both;15  reps;Supine Quad Sets: AROM;Both;10 reps;Supine Heel Slides: AAROM;Right;10 reps;Supine Straight Leg Raises: AAROM;Right;10 reps;Supine    General Comments        Pertinent Vitals/Pain Pain Assessment: 0-10 Pain Score: 7  Pain Location: R knee Pain Descriptors / Indicators: Aching;Sore Pain Intervention(s): Limited activity within patient's tolerance;Monitored during session;Premedicated before session;Ice applied    Home Living                      Prior Function            PT Goals (current goals can now be found in the care plan section) Acute Rehab PT Goals Patient Stated Goal: Resume previous lifestyle with decreased pain PT Goal Formulation: With patient Time For Goal Achievement: 03/15/14 Potential to Achieve Goals: Good Progress towards PT goals: Progressing toward goals    Frequency  7X/week    PT Plan Current plan remains appropriate    Co-evaluation             End of Session Equipment Utilized During Treatment: Gait belt;Right knee immobilizer Activity Tolerance: Patient tolerated treatment well;Patient limited by pain Patient left: in bed;with call bell/phone within reach;with family/visitor present     Time: 1435-1500 PT Time Calculation (min) (ACUTE ONLY): 25 min  Charges:  $Gait Training: 8-22 mins $Therapeutic Exercise: 8-22 mins  G Codes:      [name] 25-Mar-2014, 5:00 PM

## 2014-03-09 NOTE — Evaluation (Signed)
Occupational Therapy Evaluation Patient Details Name: [name] MRN: [id number] DOB: 1955-05-07 Today's Date: 03/09/2014    History of Present Illness R TKR   Clinical Impression   This 59 year old female was admitted for the above surgery.  She will benefit from skilled OT in acute to increase safety and independence with adls and bathroom transfers.  Pt will have daughter's assistance at home:  Will not need post acute follow up OT    Follow Up Recommendations  Supervision/Assistance - 24 hour    Equipment Recommendations  None recommended by OT    Recommendations for Other Services       Precautions / Restrictions Precautions Precautions: Knee;Fall Required Braces or Orthoses: Knee Immobilizer - Right Knee Immobilizer - Right: Discontinue once straight leg raise with < 10 degree lag Restrictions Weight Bearing Restrictions: No Other Position/Activity Restrictions: WBAT      Mobility Bed Mobility Overal bed mobility: Needs Assistance Bed Mobility: Supine to Sit     Supine to sit: Min assist;HOB elevated     General bed mobility comments: cues for sequence; assist with RLE.  Pt used bedrail  Transfers                 General transfer comment: not attempted due to dizziness    Balance                                            ADL Overall ADL's : Needs assistance/impaired     Grooming: Wash/dry hands;Wash/dry face;Sitting;Set up   Upper Body Bathing: Set up;Sitting   Lower Body Bathing: Moderate assistance (sit)   Upper Body Dressing : Set up;Sitting   Lower Body Dressing: Maximal assistance;Sit to/from stand                 General ADL Comments: pt sat EOB and completed bathing:  did not stand as dizziness did not resolve.  BP 118/62.  Pt will have daughters assist with adls.  She is not interested in AE.  She has not used tub bench:  explained sequence, but did not demonstrate     Vision     Perception      Praxis      Pertinent Vitals/Pain Pain Score: 8  Pain Location: R knee Pain Descriptors / Indicators: Aching Pain Intervention(s): Limited activity within patient's tolerance;Monitored during session;Premedicated before session;Repositioned;Ice applied     Hand Dominance Right   Extremity/Trunk Assessment Upper Extremity Assessment Upper Extremity Assessment: Overall WFL for tasks assessed           Communication Communication Communication: No difficulties   Cognition Arousal/Alertness: Awake/alert Behavior During Therapy: WFL for tasks assessed/performed Overall Cognitive Status: Within Functional Limits for tasks assessed                     General Comments       Exercises       Shoulder Instructions      Home Living Family/patient expects to be discharged to:: Private residence Living Arrangements: Children Available Help at Discharge: Family Type of Home: House Home Access: Stairs to enter Secretary/administratorntrance Stairs-Number of Steps: 2 Entrance Stairs-Rails: Right;Left;Can reach both Home Layout: One level     Bathroom Shower/Tub: Tub/shower unit Shower/tub characteristics: Engineer, building servicesCurtain Bathroom Toilet: Standard     Home Equipment: Tub bench;Toilet riser   Additional Comments: daughters got elevated  toilet seat with riser; and tub bench      Prior Functioning/Environment Level of Independence: Independent             OT Diagnosis: Acute pain;Generalized weakness   OT Problem List: Decreased strength;Decreased activity tolerance;Decreased knowledge of use of DME or AE;Pain   OT Treatment/Interventions: Self-care/ADL training;DME and/or AE instruction;Patient/family education    OT Goals(Current goals can be found in the care plan section) Acute Rehab OT Goals Patient Stated Goal: Resume previous lifestyle with decreased pain OT Goal Formulation: With patient Time For Goal Achievement: 03/16/14 Potential to Achieve Goals: Good ADL Goals Pt Will  Perform Grooming: with supervision;standing Pt Will Transfer to Toilet: with min guard assist;ambulating;bedside commode Pt Will Perform Toileting - Clothing Manipulation and hygiene: with min guard assist;sit to/from stand Additional ADL Goal #1: pt/family will verbalize sequence of tub bench transfer vs. demonstrating with min A  OT Frequency: Min 2X/week   Barriers to D/C:            Co-evaluation              End of Session    Activity Tolerance: Patient tolerated treatment well Patient left: in bed;with call bell/phone within reach;with family/visitor present   Time: 0912-0943 OT Time Calculation (min): 31 min Charges:  OT General Charges $OT Visit: 1 Procedure OT Evaluation $Initial OT Evaluation Tier I: 1 Procedure OT Treatments $Self Care/Home Management : 8-22 mins G-Codes:    Tayelor Osborne Mar 17, 2014, 10:06 AM   Marica Otter, OTR/L [phone] 03/17/14

## 2014-03-10 LAB — CBC
HCT: 33.3 % — ABNORMAL LOW (ref 36.0–46.0)
HEMOGLOBIN: 11 g/dL — AB (ref 12.0–15.0)
MCH: 30.6 pg (ref 26.0–34.0)
MCHC: 33 g/dL (ref 30.0–36.0)
MCV: 92.8 fL (ref 78.0–100.0)
PLATELETS: 159 10*3/uL (ref 150–400)
RBC: 3.59 MIL/uL — ABNORMAL LOW (ref 3.87–5.11)
RDW: 14.2 % (ref 11.5–15.5)
WBC: 12.3 10*3/uL — ABNORMAL HIGH (ref 4.0–10.5)

## 2014-03-10 NOTE — Progress Notes (Signed)
Physical Therapy Treatment Patient Details Name: [name] MRN: [id number] DOB: 09-23-1955 Today's Date: 03/10/2014    History of Present Illness R TKR    PT Comments    Better pain control this am allowing improved activity tolerance.  Follow Up Recommendations  Home health PT     Equipment Recommendations  None recommended by PT    Recommendations for Other Services OT consult     Precautions / Restrictions Precautions Precautions: Knee;Fall Required Braces or Orthoses: Knee Immobilizer - Right Knee Immobilizer - Right: Discontinue once straight leg raise with < 10 degree lag Restrictions Weight Bearing Restrictions: No Other Position/Activity Restrictions: WBAT    Mobility  Bed Mobility Overal bed mobility: Needs Assistance Bed Mobility: Supine to Sit     Supine to sit: Min assist     General bed mobility comments: cues for sequence; assist with RLE.    Transfers Overall transfer level: Needs assistance Equipment used: Rolling walker (2 wheeled) Transfers: Sit to/from Stand Sit to Stand: Min assist         General transfer comment: cues for LE management and use of UEs to self assist  Ambulation/Gait Ambulation/Gait assistance: Min assist;Min guard Ambulation Distance (Feet): 62 Feet Assistive device: Rolling walker (2 wheeled) Gait Pattern/deviations: Step-to pattern;Decreased step length - right;Decreased step length - left;Shuffle;Trunk flexed Gait velocity: decr   General Gait Details: cues for sequence, posture and position from [employer]W   Stairs            Wheelchair Mobility    Modified Rankin (Stroke Patients Only)       Balance                                    Cognition Arousal/Alertness: Awake/alert Behavior During Therapy: WFL for tasks assessed/performed Overall Cognitive Status: Within Functional Limits for tasks assessed                      Exercises Total Joint Exercises Ankle  Circles/Pumps: AROM;Both;15 reps;Supine Quad Sets: AROM;Both;Supine;15 reps Heel Slides: AAROM;Right;Supine;20 reps Straight Leg Raises: AAROM;Right;Supine;15 reps Goniometric ROM: AAROM at R knee -10- 60    General Comments        Pertinent Vitals/Pain Pain Assessment: 0-10 Pain Score: 5  Pain Location: R knee Pain Descriptors / Indicators: Aching Pain Intervention(s): Limited activity within patient's tolerance    Home Living                      Prior Function            PT Goals (current goals can now be found in the care plan section) Acute Rehab PT Goals Patient Stated Goal: Resume previous lifestyle with decreased pain PT Goal Formulation: With patient Time For Goal Achievement: 03/15/14 Potential to Achieve Goals: Good Progress towards PT goals: Progressing toward goals    Frequency  7X/week    PT Plan Current plan remains appropriate    Co-evaluation             End of Session Equipment Utilized During Treatment: Gait belt;Right knee immobilizer Activity Tolerance: Patient tolerated treatment well Patient left: in chair;with call bell/phone within reach;with family/visitor present     Time: 0933-1001 PT Time Calculation (min) (ACUTE ONLY): 28 min  Charges:  $Gait Training: 8-22 mins $Therapeutic Exercise: 8-22 mins  G Codes:      Berdene Askari Apr 05, 2014, 12:37 PM

## 2014-03-10 NOTE — Progress Notes (Signed)
Physical Therapy Treatment Patient Details Name: Bertis Ruddyonya M Fitch MRN: [id number] DOB: Jul 31, 1955 Today's Date: 03/10/2014    History of Present Illness R TKR    PT Comments    Steady progress with mobility.  Pt hopeful for d.c in am.  Follow Up Recommendations  Home health PT     Equipment Recommendations  None recommended by PT    Recommendations for Other Services OT consult     Precautions / Restrictions Precautions Precautions: Knee;Fall Required Braces or Orthoses: Knee Immobilizer - Right Knee Immobilizer - Right: Discontinue once straight leg raise with < 10 degree lag Restrictions Weight Bearing Restrictions: No Other Position/Activity Restrictions: WBAT    Mobility  Bed Mobility Overal bed mobility: Needs Assistance Bed Mobility: Supine to Sit;Sit to Supine     Supine to sit: Min assist Sit to supine: Min assist   General bed mobility comments: cues for sequence; assist with RLE.    Transfers Overall transfer level: Needs assistance Equipment used: Rolling walker (2 wheeled) Transfers: Sit to/from Stand Sit to Stand: Min guard         General transfer comment: cues for LE management and use of UEs to self assist  Ambulation/Gait Ambulation/Gait assistance: Min assist;Min guard Ambulation Distance (Feet): 84 Feet Assistive device: Rolling walker (2 wheeled) Gait Pattern/deviations: Step-to pattern;Decreased step length - right;Decreased step length - left;Shuffle;Trunk flexed Gait velocity: decr   General Gait Details: cues for sequence, posture and position from [employer]W   Stairs            Wheelchair Mobility    Modified Rankin (Stroke Patients Only)       Balance                                    Cognition Arousal/Alertness: Awake/alert Behavior During Therapy: WFL for tasks assessed/performed Overall Cognitive Status: Within Functional Limits for tasks assessed                      Exercises       General Comments        Pertinent Vitals/Pain Pain Assessment: 0-10 Pain Score: 6  Pain Location: R knee Pain Descriptors / Indicators: Aching;Sore Pain Intervention(s): Limited activity within patient's tolerance;Monitored during session;Premedicated before session;Ice applied    Home Living                      Prior Function            PT Goals (current goals can now be found in the care plan section) Acute Rehab PT Goals Patient Stated Goal: Resume previous lifestyle with decreased pain PT Goal Formulation: With patient Time For Goal Achievement: 03/15/14 Potential to Achieve Goals: Good Progress towards PT goals: Progressing toward goals    Frequency  7X/week    PT Plan Current plan remains appropriate    Co-evaluation             End of Session Equipment Utilized During Treatment: Gait belt;Right knee immobilizer Activity Tolerance: Patient tolerated treatment well Patient left: in bed;with call bell/phone within reach;with family/visitor present     Time: [id number] PT Time Calculation (min) (ACUTE ONLY): 20 min  Charges:  $Gait Training: 8-22 mins                    G Codes:      Carmello Cabiness 03/10/2014, 1:51 PM

## 2014-03-10 NOTE — Progress Notes (Signed)
Occupational Therapy Treatment Patient Details Name: [name] MRN: [id number] DOB: November 06, 1955 Today's Date: 03/10/2014    History of present illness R TKR   OT comments  Patient making progress with OT. Will benefit from one more session tomorrow to address tub bench transfer. Will go home with assistance from daughters.  Follow Up Recommendations  Supervision/Assistance - 24 hour    Equipment Recommendations  None recommended by OT    Recommendations for Other Services      Precautions / Restrictions Precautions Precautions: Knee;Fall Required Braces or Orthoses: Knee Immobilizer - Right Knee Immobilizer - Right: Discontinue once straight leg raise with < 10 degree lag Restrictions Weight Bearing Restrictions: No Other Position/Activity Restrictions: WBAT       Mobility Bed Mobility                  Transfers                      Balance                                   ADL Overall ADL's : Needs assistance/impaired                         Toilet Transfer: Min guard;Supervision/safety   Toileting- Clothing Manipulation and Hygiene: Supervision/safety         General ADL Comments: Patient continues to report that her daughters will assist with LB bathing and dressing as needed, no AE needed. She does want to practice tub transfer with bench but she just got back to bed after toileting and is fatigued. Prefers to practice that tomorrow during OT session. Patient reports she has toileted several times today and is able to perform the task almost independently with [facility] over toilet and including hygiene and clothing management.       Vision                     Perception     Praxis      Cognition   Behavior During Therapy: WFL for tasks assessed/performed Overall Cognitive Status: Within Functional Limits for tasks assessed                       Extremity/Trunk Assessment                Exercises     Shoulder Instructions       General Comments      Pertinent Vitals/ Pain       Pain Assessment: 0-10 Pain Score: 5  Pain Location: R knee Pain Descriptors / Indicators: Aching Pain Intervention(s): Limited activity within patient's tolerance  Home Living                                          Prior Functioning/Environment              Frequency Min 2X/week     Progress Toward Goals  OT Goals(current goals can now be found in the care plan section)  Progress towards OT goals: Progressing toward goals     Plan      Co-evaluation                 End of  Session CPM Right Knee CPM Right Knee: Off   Activity Tolerance     Patient Left in bed;with call bell/phone within reach;with family/visitor present   Nurse Communication          Time: [id number] OT Time Calculation (min): 8 min  Charges: OT General Charges $OT Visit: 1 Procedure OT Treatments $Self Care/Home Management : 8-22 mins  Tanyon Alipio A 03/10/2014, 11:10 AM

## 2014-03-10 NOTE — Progress Notes (Signed)
     Subjective: 2 Days Post-Op Procedure(s) (LRB): RIGHT TOTAL KNEE ARTHROPLASTY (Right)   Patient reports pain as moderate. No events throughout the night. Is progressing slowly with PT. Patient states that she was able to make it to the door once and the other PT session only walked a little down the hall.  Objective:   VITALS:   Filed Vitals:   03/10/14 0509  BP: 131/62  Pulse: 94  Temp: 98.9 F (37.2 C)  Resp: 16    Dorsiflexion/Plantar flexion intact Incision: dressing C/D/I No cellulitis present Compartment soft  LABS  Recent Labs  03/09/14 0437 03/10/14 0500  HGB 11.2* 11.0*  HCT 33.7* 33.3*  WBC 11.3* 12.3*  PLT 162 159     Recent Labs  03/09/14 0437  NA 135  K 3.8  BUN 9  CREATININE 0.80  GLUCOSE 135*     Assessment/Plan: 2 Days Post-Op Procedure(s) (LRB): RIGHT TOTAL KNEE ARTHROPLASTY (Right) Foley cath d/c'ed today Up with therapy Continue use of CPM today Discharge home with home health eventually, when ready       Anastasio AuerbachMatthew S. Ritamarie Arkin   PAC  03/10/2014, 8:29 AM

## 2014-03-11 LAB — CBC
HCT: 31.1 % — ABNORMAL LOW (ref 36.0–46.0)
Hemoglobin: 10.4 g/dL — ABNORMAL LOW (ref 12.0–15.0)
MCH: 30.9 pg (ref 26.0–34.0)
MCHC: 33.4 g/dL (ref 30.0–36.0)
MCV: 92.3 fL (ref 78.0–100.0)
Platelets: 169 10*3/uL (ref 150–400)
RBC: 3.37 MIL/uL — ABNORMAL LOW (ref 3.87–5.11)
RDW: 14.1 % (ref 11.5–15.5)
WBC: 11.5 10*3/uL — AB (ref 4.0–10.5)

## 2014-03-11 LAB — CLOSTRIDIUM DIFFICILE BY PCR: CDIFFPCR: NEGATIVE

## 2014-03-11 NOTE — Progress Notes (Signed)
Occupational Therapy Treatment Patient Details Name: [name] MRN: [id number] DOB: [birth year] Today's Date: 03/11/2014    History of present illness R TKR   OT comments  Patient educated and practiced simulated tub transfer with bench this session. Daughters will assist as needed with ADLs. All education completed and patient likely to discharge home today with daughter's assistance.   Follow Up Recommendations  Supervision/Assistance - 24 hour    Equipment Recommendations  None recommended by OT -- patient has all needed equipment   Recommendations for Other Services      Precautions / Restrictions Precautions Precautions: Knee;Fall Required Braces or Orthoses: Knee Immobilizer - Right Knee Immobilizer - Right: Discontinue once straight leg raise with < 10 degree lag Restrictions Weight Bearing Restrictions: No Other Position/Activity Restrictions: WBAT       Mobility Bed Mobility Overal bed mobility: Needs Assistance Bed Mobility: Supine to Sit;Sit to Supine     Supine to sit: Min assist Sit to supine: Min assist   General bed mobility comments:  (for RLE)  Transfers Overall transfer level: Needs assistance Equipment used: Rolling walker (2 wheeled) Transfers: Sit to/from Stand Sit to Stand: Supervision              Balance                                   ADL Overall ADL's : Needs assistance/impaired Eating/Feeding: Independent;Sitting;Bed level   Grooming: Wash/dry hands;Wash/dry face;Sitting;Set up       Lower Body Bathing: Minimal assistance;Sit to/from stand   Upper Body Dressing : Set up;Sitting   Lower Body Dressing: Moderate assistance;Sit to/from stand   Toilet Transfer: Supervision/safety;Ambulation;BSC;RW   Toileting- Clothing Manipulation and Hygiene: Supervision/safety   Tub/ Shower Transfer: Tub transfer;Minimal assistance;Ambulation;Tub bench;Rolling walker   Functional mobility during ADLs:  Supervision/safety;Rolling walker General ADL Comments: Patient agreeable to practice tub bench transfer simulated. Long discussion about patient's bathroom and how to set it up. Patient's toilet is next to tub so patient was planning to set bench up next to toilet and scoot toilet to tub bench. Educated patient and daughter to have home health therapist assess the situation and practice if needed. They verbalized understanding. Patient required assistance with getting operated leg over side of tub during practice.      Vision                     Perception     Praxis      Cognition   Behavior During Therapy: WFL for tasks assessed/performed Overall Cognitive Status: Within Functional Limits for tasks assessed                       Extremity/Trunk Assessment               Exercises     Shoulder Instructions       General Comments      Pertinent Vitals/ Pain       Pain Assessment: 0-10 Pain Score: 4  Pain Location: R knee Pain Descriptors / Indicators: Aching Pain Intervention(s): Limited activity within patient's tolerance  Home Living                                          Prior Functioning/Environment  Frequency Min 2X/week     Progress Toward Goals  OT Goals(current goals can now be found in the care plan section)  Progress towards OT goals: Progressing toward goals  Acute Rehab OT Goals Patient Stated Goal: Resume previous lifestyle with decreased pain  Plan Discharge plan remains appropriate    Co-evaluation                 End of Session Equipment Utilized During Treatment: Rolling walker;Right knee immobilizer   Activity Tolerance Patient tolerated treatment well   Patient Left in bed;with call bell/phone within reach;with family/visitor present   Nurse Communication          Time: [id number] OT Time Calculation (min): 24 min  Charges: OT General Charges $OT Visit: 1  Procedure OT Treatments $Self Care/Home Management : 23-37 mins  Diala Waxman A 03/11/2014, 11:07 AM

## 2014-03-11 NOTE — Progress Notes (Signed)
Pt placed on enteric precaution protocol  Per cdiff standing order protocol.Pt reports 4 very  small amt loose bm on 2/20 and one small bm 0300.Colace held at hs .Procedure/precaution explained to Pt and dtr at bedside.Will send next sample to lab for cdiff. Linward HeadlandBeverly, Nanna Ertle D

## 2014-03-11 NOTE — Progress Notes (Signed)
Physical Therapy Treatment Patient Details Name: [name] MRN: [id number] DOB: 23-Sep-1955 Today's Date: 03/11/2014    History of Present Illness R TKR    PT Comments    POD # 3 daughter present with many questions and extra treatment time needed to educate family.  Instructed on proper KI use for amb and stairs.  Instructed on proper application.  Educated on safe handling during transfers and gait.  Practiced going up/down 2 steps forward with 2 rails.  Performed and instructed on all supine TKR TE's HEP following handout.  Instructed on proper tech and freq plus use of ICE after.  All other therapy related questions addressed. Pt ready for D/C to home per therapy standpoint.  Follow Up Recommendations  Home health PT     Equipment Recommendations  None recommended by PT    Recommendations for Other Services       Precautions / Restrictions Precautions Precautions: Knee;Fall Precaution Comments: instructed pt and daughter on KI use for amb and stairs Required Braces or Orthoses: Knee Immobilizer - Right Knee Immobilizer - Right: Discontinue once straight leg raise with < 10 degree lag Restrictions Weight Bearing Restrictions: No Other Position/Activity Restrictions: WBAT    Mobility  Bed Mobility Overal bed mobility: Needs Assistance Bed Mobility: Supine to Sit;Sit to Supine     Supine to sit: Min assist Sit to supine: Min assist   General bed mobility comments: R lE only and increased time  Transfers Overall transfer level: Needs assistance Equipment used: Rolling walker (2 wheeled) Transfers: Sit to/from Stand Sit to Stand: Supervision;Min guard         General transfer comment: <25% VC's on safety with turns and hand placement with stand to sit  Ambulation/Gait Ambulation/Gait assistance: Min guard Ambulation Distance (Feet): 75 Feet Assistive device: Rolling walker (2 wheeled) Gait Pattern/deviations: Step-to pattern;Decreased stance time -  right;Trunk flexed Gait velocity: decreased   General Gait Details: <25% VC's on proper walker to self distance and safety with turns   Stairs Stairs: Yes Stairs assistance: Min assist Stair Management: Two rails;Step to pattern;Forwards Number of Stairs: 2 General stair comments: with daughter and direction on proper tech and sequencing plus walker placement.    Wheelchair Mobility    Modified Rankin (Stroke Patients Only)       Balance                                    Cognition Arousal/Alertness: Awake/alert Behavior During Therapy: WFL for tasks assessed/performed Overall Cognitive Status: Within Functional Limits for tasks assessed                      Exercises   Total Knee Replacement TE's 10 reps B LE ankle pumps 10 reps towel squeezes 10 reps knee presses 10 reps heel slides  10 reps SAQ's 10 reps SLR's 10 reps ABD Followed by ICE     General Comments        Pertinent Vitals/Pain Pain Assessment: 0-10 Pain Score: 6  Pain Location: R knee Pain Descriptors / Indicators: Sore;Tender Pain Intervention(s): Monitored during session;Premedicated before session;Repositioned;Ice applied    Home Living                      Prior Function            PT Goals (current goals can now be found in the care plan  section) Acute Rehab PT Goals Patient Stated Goal: Resume previous lifestyle with decreased pain Progress towards PT goals: Progressing toward goals    Frequency  7X/week    PT Plan Current plan remains appropriate    Co-evaluation             End of Session Equipment Utilized During Treatment: Gait belt;Right knee immobilizer Activity Tolerance: Patient tolerated treatment well;Patient limited by fatigue Patient left: in bed;with call bell/phone within reach;with family/visitor present     Time: 1610-9604 PT Time Calculation (min) (ACUTE ONLY): 55 min  Charges:  $Gait Training: 8-22  mins $Therapeutic Exercise: 23-37 mins $Therapeutic Activity: 8-22 mins                    G Codes:      Felecia Shelling  PTA [facility]  Acute  Rehab Pager      [phone]

## 2014-03-11 NOTE — Progress Notes (Signed)
Notified Marciano SequinBryson Stillwell, [place]PA of patients low BP 92/63 HR 118 patient asymptomatic at this time. No new orders received.Will continue to monitor patient

## 2014-03-11 NOTE — Progress Notes (Signed)
Discharge instructions reviewed with patient and daughter utilizing teach back method. [facility]

## 2014-03-11 NOTE — Progress Notes (Signed)
Notified Bryson, PA of current vital signs and patient states she feels fine and is ready for discharge.  OK to discharge charge home with home health.

## 2014-03-11 NOTE — Progress Notes (Signed)
Subjective: 3 Days Post-Op Procedure(s) (LRB): RIGHT TOTAL KNEE ARTHROPLASTY (Right) Patient reports pain as moderate. Reports loose stools x 1 day.  Progressing with PT. Tolerating PO's. Denies CP, SOB, or calf pain.   Objective: Vital signs in last 24 hours: Temp:  [99 F (37.2 C)-100.1 F (37.8 C)] 99.2 F (37.3 C) (02/21 0540) Pulse Rate:  [102-105] 105 (02/21 0540) Resp:  [16-20] 16 (02/21 0540) BP: (101-112)/(59-68) 105/67 mmHg (02/21 0910) SpO2:  [95 %-97 %] 95 % (02/21 0540)  Intake/Output from previous day: 02/20 0701 - 02/21 0700 In: 600 [P.O.:600] Out: 300 [Urine:300] Intake/Output this shift:     Recent Labs  03/09/14 0437 03/10/14 0500 03/11/14 0448  HGB 11.2* 11.0* 10.4*    Recent Labs  03/10/14 0500 03/11/14 0448  WBC 12.3* 11.5*  RBC 3.59* 3.37*  HCT 33.3* 31.1*  PLT 159 169    Recent Labs  03/09/14 0437  NA 135  K 3.8  CL 102  CO2 27  BUN 9  CREATININE 0.80  GLUCOSE 135*  CALCIUM 8.7   No results for input(s): LABPT, INR in the last 72 hours.  Well nourished. Alert and oriented x3. RRR, Lungs clear, BS x4. Abdomen soft and non tender. Right Calf soft and non tender. Right knee dressing C/D/I. No DVT signs. Compartment soft. No signs of infection.  Right LE neurovascular intact.  Assessment/Plan: 3 Days Post-Op Procedure(s) (LRB): RIGHT TOTAL KNEE ARTHROPLASTY (Right) Up with PT Continue current care Plan D/c home today Ace wrap D/c'ed  Loose Stools: Probably related to diet and Stools softners Sample Sent for C. Diff Awaiting results. Enteric precautions  Jaidalyn Schillo L 03/11/2014, 9:18 AM

## 2014-03-11 NOTE — Discharge Summary (Signed)
Physician Discharge Summary   Patient ID: [name] MRN: [id number] DOB/AGE: [birth year] 59 y.o.  Admit date: 03/08/2014 Discharge date: 03/11/2014  Primary Diagnosis: R knee primary DJD  Admission Diagnoses:  Past Medical History  Diagnosis Date  . Unspecified hypothyroidism   . Obesity   . Menopause 2002  . Hyperlipidemia 04/2004  . Hemorrhoids, internal 03/2002  . Colon polyp 6/09    Dr. Collene Mares (no path report in chart)  . Hypertension   . History of mammogram 08/2010    normal screening  . Vitamin D deficiency 11/2010  . Normal cardiac stress test 08/19/04    normal 2D echo and cardiolite perfusion study; Dr. West Bali  . Sleep disturbance 06/04/2004    normal sleep study  . PONV (postoperative nausea and vomiting)   . Arthritis    Discharge Diagnoses:   Active Problems:   Right knee DJD  Estimated body mass index is 31.49 kg/(m^2) as calculated from the following:   Height as of this encounter: 5' 6"  (1.676 m).   Weight as of this encounter: 88.451 kg (195 lb).  Procedure:  Procedure(s) (LRB): RIGHT TOTAL KNEE ARTHROPLASTY (Right)   Consults: None  HPI: see H&P Laboratory Data: Admission on 03/08/2014, Discharged on 03/11/2014  Component Date Value Ref Range Status  . WBC 03/09/2014 11.3* 4.0 - 10.5 K/uL Final  . RBC 03/09/2014 3.62* 3.87 - 5.11 MIL/uL Final  . Hemoglobin 03/09/2014 11.2* 12.0 - 15.0 g/dL Final  . HCT 03/09/2014 33.7* 36.0 - 46.0 % Final  . MCV 03/09/2014 93.1  78.0 - 100.0 fL Final  . MCH 03/09/2014 30.9  26.0 - 34.0 pg Final  . MCHC 03/09/2014 33.2  30.0 - 36.0 g/dL Final  . RDW 03/09/2014 14.0  11.5 - 15.5 % Final  . Platelets 03/09/2014 162  150 - 400 K/uL Final  . Sodium 03/09/2014 135  135 - 145 mmol/L Final  . Potassium 03/09/2014 3.8  3.5 - 5.1 mmol/L Final  . Chloride 03/09/2014 102  96 - 112 mmol/L Final  . CO2 03/09/2014 27  19 - 32 mmol/L Final  . Glucose, Bld 03/09/2014 135* 70 - 99 mg/dL Final  . BUN 03/09/2014 9  6 - 23  mg/dL Final  . Creatinine, Ser 03/09/2014 0.80  0.50 - 1.10 mg/dL Final  . Calcium 03/09/2014 8.7  8.4 - 10.5 mg/dL Final  . GFR calc non Af Amer 03/09/2014 81* >90 mL/min Final  . GFR calc Af Amer 03/09/2014 >90  >90 mL/min Final   Comment: (NOTE) The eGFR has been calculated using the CKD EPI equation. This calculation has not been validated in all clinical situations. eGFR's persistently <90 mL/min signify possible Chronic Kidney Disease.   . Anion gap 03/09/2014 6  5 - 15 Final  . WBC 03/10/2014 12.3* 4.0 - 10.5 K/uL Final  . RBC 03/10/2014 3.59* 3.87 - 5.11 MIL/uL Final  . Hemoglobin 03/10/2014 11.0* 12.0 - 15.0 g/dL Final  . HCT 03/10/2014 33.3* 36.0 - 46.0 % Final  . MCV 03/10/2014 92.8  78.0 - 100.0 fL Final  . MCH 03/10/2014 30.6  26.0 - 34.0 pg Final  . MCHC 03/10/2014 33.0  30.0 - 36.0 g/dL Final  . RDW 03/10/2014 14.2  11.5 - 15.5 % Final  . Platelets 03/10/2014 159  150 - 400 K/uL Final  . WBC 03/11/2014 11.5* 4.0 - 10.5 K/uL Final  . RBC 03/11/2014 3.37* 3.87 - 5.11 MIL/uL Final  . Hemoglobin 03/11/2014 10.4* 12.0 - 15.0 g/dL  Final  . HCT 03/11/2014 31.1* 36.0 - 46.0 % Final  . MCV 03/11/2014 92.3  78.0 - 100.0 fL Final  . MCH 03/11/2014 30.9  26.0 - 34.0 pg Final  . MCHC 03/11/2014 33.4  30.0 - 36.0 g/dL Final  . RDW 03/11/2014 14.1  11.5 - 15.5 % Final  . Platelets 03/11/2014 169  150 - 400 K/uL Final  . C difficile by pcr 03/11/2014 NEGATIVE  NEGATIVE Final   Performed at [facility] Outpatient Visit on 02/28/2014  Component Date Value Ref Range Status  . Sodium 02/28/2014 136  135 - 145 mmol/L Final  . Potassium 02/28/2014 4.2  3.5 - 5.1 mmol/L Final  . Chloride 02/28/2014 100  96 - 112 mmol/L Final  . CO2 02/28/2014 28  19 - 32 mmol/L Final  . Glucose, Bld 02/28/2014 110* 70 - 99 mg/dL Final  . BUN 02/28/2014 13  6 - 23 mg/dL Final  . Creatinine, Ser 02/28/2014 0.78  0.50 - 1.10 mg/dL Final  . Calcium 02/28/2014 9.9  8.4 - 10.5 mg/dL Final   . GFR calc non Af Amer 02/28/2014 >90  >90 mL/min Final  . GFR calc Af Amer 02/28/2014 >90  >90 mL/min Final   Comment: (NOTE) The eGFR has been calculated using the CKD EPI equation. This calculation has not been validated in all clinical situations. eGFR's persistently <90 mL/min signify possible Chronic Kidney Disease.   . Anion gap 02/28/2014 8  5 - 15 Final  . WBC 02/28/2014 9.0  4.0 - 10.5 K/uL Final  . RBC 02/28/2014 4.61  3.87 - 5.11 MIL/uL Final  . Hemoglobin 02/28/2014 14.3  12.0 - 15.0 g/dL Final  . HCT 02/28/2014 42.1  36.0 - 46.0 % Final  . MCV 02/28/2014 91.3  78.0 - 100.0 fL Final  . MCH 02/28/2014 31.0  26.0 - 34.0 pg Final  . MCHC 02/28/2014 34.0  30.0 - 36.0 g/dL Final  . RDW 02/28/2014 14.0  11.5 - 15.5 % Final  . Platelets 02/28/2014 191  150 - 400 K/uL Final  . Prothrombin Time 02/28/2014 13.8  11.6 - 15.2 seconds Final  . INR 02/28/2014 1.05  0.00 - 1.49 Final  . ABO/RH(D) 02/28/2014 O NEG   Final  . Antibody Screen 02/28/2014 NEG   Final  . Sample Expiration 02/28/2014 03/11/2014   Final  . Color, Urine 02/28/2014 YELLOW  YELLOW Final  . APPearance 02/28/2014 CLEAR  CLEAR Final  . Specific Gravity, Urine 02/28/2014 1.008  1.005 - 1.030 Final  . pH 02/28/2014 7.0  5.0 - 8.0 Final  . Glucose, UA 02/28/2014 NEGATIVE  NEGATIVE mg/dL Final  . Hgb urine dipstick 02/28/2014 NEGATIVE  NEGATIVE Final  . Bilirubin Urine 02/28/2014 NEGATIVE  NEGATIVE Final  . Ketones, ur 02/28/2014 NEGATIVE  NEGATIVE mg/dL Final  . Protein, ur 02/28/2014 NEGATIVE  NEGATIVE mg/dL Final  . Urobilinogen, UA 02/28/2014 0.2  0.0 - 1.0 mg/dL Final  . Nitrite 02/28/2014 NEGATIVE  NEGATIVE Final  . Leukocytes, UA 02/28/2014 NEGATIVE  NEGATIVE Final   MICROSCOPIC NOT DONE ON URINES WITH NEGATIVE PROTEIN, BLOOD, LEUKOCYTES, NITRITE, OR GLUCOSE <1000 mg/dL.  Marland Kitchen MRSA, PCR 02/28/2014 NEGATIVE  NEGATIVE Final  . Staphylococcus aureus 02/28/2014 POSITIVE* NEGATIVE Final   Comment:        The  Xpert SA Assay (FDA approved for NASAL specimens in patients over 35 years of age), is one component of a comprehensive surveillance program.  Test performance has been validated by [facility]  for patients greater than or equal to 74 year old. It is not intended to diagnose infection nor to guide or monitor treatment.   . ABO/RH(D) 02/28/2014 O NEG   Final  Clinical Support on 02/07/2014  Component Date Value Ref Range Status  . Cholesterol 02/07/2014 187  0 - 200 mg/dL Final   Comment: ATP III Classification:       < 200        mg/dL        Desirable      200 - 239     mg/dL        Borderline High      >= 240        mg/dL        High     . Triglycerides 02/07/2014 142  <150 mg/dL Final  . HDL 02/07/2014 67  >39 mg/dL Final  . Total CHOL/HDL Ratio 02/07/2014 2.8   Final  . VLDL 02/07/2014 28  0 - 40 mg/dL Final  . LDL Cholesterol 02/07/2014 92  0 - 99 mg/dL Final   Comment:   Total Cholesterol/HDL Ratio:CHD Risk                        Coronary Heart Disease Risk Table                                        Men       Women          1/2 Average Risk              3.4        3.3              Average Risk              5.0        4.4           2X Average Risk              9.6        7.1           3X Average Risk             23.4       11.0 Use the calculated Patient Ratio above and the CHD Risk table  to determine the patient's CHD Risk. ATP III Classification (LDL):       < 100        mg/dL         Optimal      100 - 129     mg/dL         Near or Above Optimal      130 - 159     mg/dL         Borderline High      160 - 189     mg/dL         High       > 190        mg/dL         Very High     . Sodium 02/07/2014 138  135 - 145 mEq/L Final  . Potassium 02/07/2014 4.6  3.5 - 5.3 mEq/L Final  . Chloride 02/07/2014 100  96 - 112 mEq/L Final  . CO2 02/07/2014 28  19 - 32 mEq/L Final  .  Glucose, Bld 02/07/2014 97  70 - 99 mg/dL Final  . BUN 02/07/2014 12  6 - 23 mg/dL Final  .  Creat 02/07/2014 0.83  0.50 - 1.10 mg/dL Final  . Total Bilirubin 02/07/2014 0.4  0.2 - 1.2 mg/dL Final  . Alkaline Phosphatase 02/07/2014 88  39 - 117 U/L Final  . AST 02/07/2014 15  0 - 37 U/L Final  . ALT 02/07/2014 13  0 - 35 U/L Final  . Total Protein 02/07/2014 7.9  6.0 - 8.3 g/dL Final  . Albumin 02/07/2014 4.8  3.5 - 5.2 g/dL Final  . Calcium 02/07/2014 10.2  8.4 - 10.5 mg/dL Final  . WBC 02/07/2014 9.0  4.0 - 10.5 K/uL Final  . RBC 02/07/2014 4.61  3.87 - 5.11 MIL/uL Final  . Hemoglobin 02/07/2014 13.9  12.0 - 15.0 g/dL Final  . HCT 02/07/2014 40.9  36.0 - 46.0 % Final  . MCV 02/07/2014 88.7  78.0 - 100.0 fL Final  . MCH 02/07/2014 30.2  26.0 - 34.0 pg Final  . MCHC 02/07/2014 34.0  30.0 - 36.0 g/dL Final  . RDW 02/07/2014 14.7  11.5 - 15.5 % Final  . Platelets 02/07/2014 215  150 - 400 K/uL Final  . MPV 02/07/2014 11.7  8.6 - 12.4 fL Final   ** Please note change in reference range(s). **  . Neutrophils Relative % 02/07/2014 70  43 - 77 % Final  . Neutro Abs 02/07/2014 6.3  1.7 - 7.7 K/uL Final  . Lymphocytes Relative 02/07/2014 20  12 - 46 % Final  . Lymphs Abs 02/07/2014 1.8  0.7 - 4.0 K/uL Final  . Monocytes Relative 02/07/2014 7  3 - 12 % Final  . Monocytes Absolute 02/07/2014 0.6  0.1 - 1.0 K/uL Final  . Eosinophils Relative 02/07/2014 2  0 - 5 % Final  . Eosinophils Absolute 02/07/2014 0.2  0.0 - 0.7 K/uL Final  . Basophils Relative 02/07/2014 1  0 - 1 % Final  . Basophils Absolute 02/07/2014 0.1  0.0 - 0.1 K/uL Final  . Smear Review 02/07/2014 Criteria for review not met   Final  . Vit D, 25-Hydroxy 02/07/2014 15* 30 - 100 ng/mL Final   Comment: ** Please note change in reference range(s). ** Vitamin D Status           25-OH Vitamin D        Deficiency                <20 ng/mL        Insufficiency         20 - 29 ng/mL        Optimal             > or = 30 ng/mL   For 25-OH Vitamin D testing on patients on D2-supplementation and patients for whom quantitation of  D2 and D3 fractions is required, the QuestAssureD 25-OH VIT D, (D2,D3), LC/MS/MS is recommended: order code [id number] (patients > 2 yrs).   . TSH 02/07/2014 0.896  0.350 - 4.500 uIU/mL Final     X-Rays:Dg Knee 1-2 Views Right  02/28/2014   CLINICAL DATA:  Total knee replacement preop evaluation.  EXAM: RIGHT KNEE - 1-2 VIEW  COMPARISON:  None.  FINDINGS: Tricompartment degenerative change. No acute abnormality identified. No evidence of fracture or dislocation.  IMPRESSION: Tricompartment degenerative change.  No acute abnormality.   Electronically Signed   By: Marcello Moores  Register   On: 02/28/2014 15:14  Dg Knee Right Port  03/08/2014   CLINICAL DATA:  Total knee arthroplasty.  EXAM: PORTABLE RIGHT KNEE - 1-2 VIEW  COMPARISON:  02/28/2014  FINDINGS: New total knee arthroplasty which is well seated. There is no evidence of osseous fracture or malalignment. Subcutaneous gas throughout the lower extremity is expected.  IMPRESSION: Unremarkable new right knee arthroplasty.   Electronically Signed   By: Monte Fantasia M.D.   On: 03/08/2014 10:28    EKG: Orders placed or performed in visit on 02/28/14  . EKG 12-Lead     Hospital Course: [name] is a 59 y.o. who was admitted to [facility]. They were brought to the operating room on 03/08/2014 and underwent Procedure(s): RIGHT TOTAL KNEE ARTHROPLASTY.  Patient tolerated the procedure well and was later transferred to the recovery room and then to the orthopaedic floor for postoperative care.  They were given PO and IV analgesics for pain control following their surgery.  They were given 24 hours of postoperative antibiotics of  Anti-infectives    Start     Dose/Rate Route Frequency Ordered Stop   03/08/14 1400  ceFAZolin (ANCEF) IVPB 2 g/50 mL premix     2 g 100 mL/hr over 30 Minutes Intravenous Every 6 hours 03/08/14 1153 03/09/14 0300   03/08/14 0807  polymyxin B 500,000 Units, bacitracin 50,000 Units in sodium chloride irrigation 0.9 %  500 mL irrigation  Status:  Discontinued       As needed 03/08/14 0807 03/08/14 0943   03/08/14 0600  ceFAZolin (ANCEF) IVPB 2 g/50 mL premix     2 g 100 mL/hr over 30 Minutes Intravenous On call to O.R. 03/08/14 9450 03/08/14 0745   03/08/14 0600  clindamycin (CLEOCIN) IVPB 900 mg     900 mg 100 mL/hr over 30 Minutes Intravenous On call to O.R. 03/08/14 0558 03/08/14 0800     and started on DVT prophylaxis in the form of Xarelto, TED hose and SCDs.   PT and OT were ordered for total joint protocol.  Discharge planning consulted to help with postop disposition and equipment needs.  Patient had a fair night on the evening of surgery.  They started to get up OOB with therapy on day one. Hemovac drain was pulled without difficulty.  Continued to work with therapy into day two. By day three, the patient had progressed with therapy and meeting their goals.  Incision was healing well.  Patient was seen in rounds and was ready to go home. Stool sample sent to r/o C. Diff due to loose stools.   Diet: Regular diet Activity:WBAT Follow-up:in 10-14 days Disposition - Home Discharged Condition: good      Medication List    STOP taking these medications        aspirin EC 81 MG tablet     HYDROcodone-acetaminophen 5-325 MG per tablet  Commonly known as:  NORCO/VICODIN     ibuprofen 200 MG tablet  Commonly known as:  ADVIL,MOTRIN      TAKE these medications        acetaminophen 325 MG tablet  Commonly known as:  TYLENOL  Take 650 mg by mouth every 6 (six) hours as needed (For pain.).     buPROPion 300 MG 24 hr tablet  Commonly known as:  WELLBUTRIN XL  Take 1 tablet (300 mg total) by mouth every morning.     desonide 0.05 % cream  Commonly known as:  DESOWEN  Apply topically 2 (two) times daily.  docusate sodium 100 MG capsule  Commonly known as:  COLACE  Take 1 capsule (100 mg total) by mouth 2 (two) times daily as needed for mild constipation.     ergocalciferol 50000 UNITS  capsule  Commonly known as:  VITAMIN D2  Take 1 capsule (50,000 Units total) by mouth once a week.     lisinopril-hydrochlorothiazide 10-12.5 MG per tablet  Commonly known as:  PRINZIDE,ZESTORETIC  Take 1 tablet by mouth daily.     methocarbamol 500 MG tablet  Commonly known as:  ROBAXIN  Take 1 tablet (500 mg total) by mouth every 8 (eight) hours as needed for muscle spasms.     oxyCODONE-acetaminophen 5-325 MG per tablet  Commonly known as:  PERCOCET  Take 1-2 tablets by mouth every 4 (four) hours as needed.     rivaroxaban 10 MG Tabs tablet  Commonly known as:  XARELTO  Take 1 tablet (10 mg total) by mouth daily.     levothyroxine 75 MCG tablet  Commonly known as:  SYNTHROID, LEVOTHROID  Take 75 mcg by mouth daily before breakfast.     SYNTHROID 75 MCG tablet  Generic drug:  levothyroxine  TAKE 1 TABLET BY MOUTH EVERY DAY           Follow-up Information    Follow up with BEANE,JEFFREY C, MD In 2 weeks.   Specialty:  Orthopedic Surgery   Why:  For suture removal   Contact information:   [address] [place] [address] [phone]       Follow up with [facility].   Why:  Home Health Physical Therapy   Contact information:   [place] [address] [phone]       Signed: Lacie Draft, PA-C Orthopaedic Surgery 03/11/2014, 6:58 PM

## 2014-03-11 NOTE — Progress Notes (Signed)
CARE MANAGEMENT NOTE 03/11/2014  Patient:  [name],[name]   Account Number:  [id number]  Date Initiated:  03/11/2014  Documentation initiated by:  [facility]Mire[facility]up  Subjective/Objective Assessment:   RIGHT TOTAL KNEE ARTHROPLASTY     Action/Plan:   Anticipated DC Date:  03/11/2014   Anticipated DC Plan:  HOME W HOME HEALTH SERVICES      DC Planning Services  CM consult      Choice offered to / List presented [facility]:          [facility]      [facility]   Status of service:  Completed, signed off Medicare Important Message given?  NO (If response is "NO", the following Medicare IM given date fields will be blank) Date Medicare IM given:   Medicare IM given by:   Date Additional Medicare IM given:   Additional Medicare IM given by:    Discharge Disposition:  HOME W HOME HEALTH SERVICES  Per UR Regulation:  Reviewed for med. necessity/level of care/duration of stay  If discussed at Long Length of Stay Meetings, dates discussed:    Comments:  03/11/2014 1040 NCM spoke to pt and [facility]. Has RW, raised toilet seat and transfer bench at home. Offered 3n1 and pt states she did not need at this time. Isidoro DonningAlesia Seairra Otani RN CCM Case Mgmt phone [phone]

## 2014-03-14 ENCOUNTER — Ambulatory Visit (HOSPITAL_COMMUNITY)
Admission: RE | Admit: 2014-03-14 | Discharge: 2014-03-14 | Disposition: A | Discharge status: Home / Self Care | Payer: BLUE CROSS/BLUE SHIELD | Source: Ambulatory Visit | Attending: Cardiology | Admitting: Cardiology

## 2014-03-14 ENCOUNTER — Other Ambulatory Visit (HOSPITAL_COMMUNITY): Payer: Self-pay | Admitting: Specialist

## 2014-03-14 DIAGNOSIS — M79661 Pain in right lower leg: Secondary | ICD-10-CM | POA: Diagnosis present

## 2014-03-14 DIAGNOSIS — M7989 Other specified soft tissue disorders: Secondary | ICD-10-CM

## 2014-03-14 NOTE — Progress Notes (Signed)
Right Lower Ext. Venous Duplex Completed. Negative for DVT or SVT. Burnell Hurta, BS, RDMS, RVT  

## 2014-04-25 ENCOUNTER — Ambulatory Visit: Payer: Self-pay | Admitting: Orthopedic Surgery

## 2014-04-27 ENCOUNTER — Ambulatory Visit: Payer: Self-pay | Admitting: Orthopedic Surgery

## 2014-05-01 ENCOUNTER — Ambulatory Visit: Payer: Self-pay | Admitting: Orthopedic Surgery

## 2014-05-01 NOTE — H&P (Signed)
[name] DOB: 08/07/1955 Divorced / Language: English / Race: White Female  Chief Complaint: Left knee pain  History of Present Illness  [name] follows up, 8 weeks s/p R TKA, doing well. She reports she is making progress in PT with her right knee, however, is not yet in full extension and is having trouble getting the knee in extension while walking due to her left knee flexion contracture. She is actually hoping to proceed with left TKA in another few weeks. She is taking Percocet 7.5mg q4h prn, Robaxin prn, and ASA daily. She is taking a daily multivitamin with iron, her appetite has improved, her energy is improved as well. She is worried about post-op nausea which was an issue for her following her right knee (general anesthesia). She is out of work. She's still noting some numbness over the lateral aspect of her right knee. She's been working on some scar massage.  Allergies Latex Dermatitis. Zinc *MULTIVITAMINS*  Family History Cancer Father, Brother, Sister. Father deceased, lung CA Mother Living. age 90 Siblings one brother and one sister living both healthy. one brother deceased age 4- leukemia. one sister deceased age 13- brain tumor. Children In good health. 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 working full time Marital status Divorced. Current occupation Personal Banker [employer] Post-Surgical Plans home with HHPT. one level home with 2 steps to enter Advance Directives none  Medication History Robaxin (500MG Tablet, 1 (one) Tablet Oral every 6-8 hours as needed for spasms/pain, Taken starting 03/22/2014)  Active. Percocet (7.5-325MG Tablet, 1-2 Oral every 4-6 hours as needed for pain, Taken starting 04/17/2014) Active. Aspirin (325MG Capsule, 1 (one) Oral) Active. Synthroid (75MCG Tablet, Oral) Active. Lisinopril-Hydrochlorothiazide (10-12.5MG Tablet, Oral) Active. BuPROPion HCl ER (XL) (300MG Tablet ER 24HR, Oral) Active. Vitamin D (Ergocalciferol) (50000UNIT Capsule, Oral) Active. Medications Reconciled  Past Surgical History Cesarean Delivery 4 Arthroscopic Knee Surgery - Both post-op N/V Hysterectomy Right total knee replacement- post-op N/V  Past Medical Hx Depression Hypothyroidism Cellulitis L leg 11/2013  Review of Systems General Not Present- Fever and Weight Loss. Skin Not Present- Rash. Cardiovascular Not Present- Chest Pain and Shortness of Breath. Musculoskeletal Present- Joint Pain, Joint Stiffness, Joint Swelling, Morning Stiffness, Muscle Pain and Muscle Weakness. Neurological Not Present- Difficulty with balance, Loss of bladder control, Loss of bowel control and Weakness. Endocrine Not Present- Excessive Thirst and Excessive Urination. Hematology Not Present- Easy Bruising.  Physical Exam General Mental Status -Alert, cooperative and good historian. General Appearance-pleasant, Not in acute distress. Orientation-Oriented X3. Build & Nutrition-Well nourished and Well developed.  Head and Neck Head-normocephalic, atraumatic . Neck Global Assessment - supple, no bruit auscultated on the right, no bruit auscultated on the left.  Eye Pupil - Bilateral-Regular and Round. Motion - Bilateral-EOMI.  Chest and Lung Exam Auscultation Breath sounds - clear at anterior chest wall and clear at posterior chest wall. Adventitious sounds - No Adventitious sounds.  Cardiovascular Auscultation Rhythm - Regular rate and rhythm. Heart Sounds - S1 WNL and S2 WNL. Murmurs & Other Heart Sounds - Auscultation of the heart reveals - No  Murmurs.  Abdomen Palpation/Percussion Tenderness - Abdomen is non-tender to palpation. Rigidity (guarding) - Abdomen is soft. Auscultation Auscultation of   the abdomen reveals - Bowel sounds normal.  Female Genitourinary Not done, not pertinent to present illness  Right Lower Extremity: Right Knee: Inspection and Palpation - Tenderness - medial knee tender to palpation(mild), no tenderness to palpation of the superior calf, no tenderness to palpation of the quadriceps tendon, no tenderness to palpation of the patellar tendon, no tenderness to palpation of the patella, no tenderness to palpation of the fibular head, no tenderness to palpation of the peroneal nerve. Swelling - mild. Tissue tension/texture is - soft. Pulses - 2+. Sensation - intact to light touch. Skin - Color - erythema present, no ecchymosis. Temperature - normal warmth. Wound/Incision - Appearance - The incision is well healed with no erythema or exudates. Swelling - there is no swelling around the incision. Healing - The incision is well healed and healing as expected. ROM: Flexion - AROM - 95 . PROM - 100 . Extension - AROM - 5 . PROM - 4 . Right Knee - Stability - No laxity present and No instability about the knee. Deformities/Malalignments/Discrepancies - no deformities noted. Left Lower Extremity: Left Knee: Inspection and Palpation - Tenderness - medial joint line tender to palpation, no tenderness to palpation of the superior calf, no tenderness to palpation of the quadriceps tendon, no tenderness to palpation of the patellar tendon, no tenderness to palpation of the patella, no tenderness to palpation of the lateral joint line, no tenderness to palpation of the fibular head, no tenderness to palpation of the peroneal nerve. Patellar Tendon - no pain to palpation of the patellar tendon. Swelling - periarticular swelling present. Effusion - trace. Tissue tension/texture is - soft. Pulses - 2+. Sensation - intact to light  touch. Alignment - Tibiofemoral Alignment - varus alignment. Skin - Color - no ecchymosis, no erythema. Strength and Tone - Quadriceps - 5/5. Hamstrings - 5/5. ROM: Flexion - AROM - 90 . Extension - AROM - 10 . Stability - Valgus Laxity at 30 - None. Valgus Laxity at 0 - None. Varus Laxity at 30 - None. Varus Laxity at 0 - None. Lachman - Negative. Anterior Drawer Test - Negative. Posterior Drawer Test - Negative. Left Knee - Deformities/Malalignments/Discrepancies - no deformities noted. Special Tests - McMurray Test (lateral) - negative. McMurray Test (medial) - negative. Patellar Compression Pain - mild pain.  Imaging Prior Left knee standing xrays with end-stage medial joint space narrowing, severe, bone-on-bone, with varus deformity.  Assessment & Plan Primary osteoarthritis of left knee (M17.12)  Pt doing well nearly 8 weeks s/p R TKA, progressing well with PT, -5 to 95 actively today. Recommend continued PT and HEP for work on ROM and strengthening, especially prior to undergoing her L TKA potentially in a few more weeks. Continue ice, elevation, scar massage, pain meds/muscle relaxers as needed, she will call for refills by request. She will also continue daily ASA for DVT ppx. Expect continued improvement in ROM with time and as she continues to work with PT. She will follow up for the right knee in 6 weeks for routine 12 week post-op ROM check. In regards to the left knee, she has severe end-stage arthrosis, worsening flexion contracture, ongoing pain limiting ADL's, and she would like to proceed with TKA on the left. At this point, recommend proceeding with left total knee replacement. Discussed the procedure itself as well as risks, complications and alternatives, including but not limited to DVT, PE, infx, bleeding, failure of procedure, need for secondary procedure including manipulation, nerve injury, ongoing pain/symptoms, anesthesia risk, even   stroke or death. Also discussed typical  post-op protocols, activity restrictions, need for PT, flexion/extension exercises, time out of work. Discussed need for DVT ppx post-op with Xarelto then ASA per protocol. Discussed dental ppx. Also discussed limitations post-operatively such as kneeling and squatting. All questions were answered. Patient desires to proceed with surgery. She will not require repeat medical clearance as her clearance is within 6 months and no complications following R TKA. Discussed her post-op nausea and vomiting last time, will make some changes this time to try to avoid that, will plan on spinal anesthesia, IV tylenol, possibly a scopalamine patch and decadron (per anesthesia's recommendations). Mutually agreed to waive her H&P. Discussed post-op medications, Percocet 7.5 or 10mg depending on pain control, anti-emetic, Robaxin, Colace, Xarelto for DVT ppx. She previously tested staph aureus positive but MRSA negative, given that plus her recent hospital admission for total knee she was given MRSA decolonization Rx's to start a week pre-op for bactroban ointment and chlorhexidine wash. She will likely require up to 12 weeks out of work following her left knee. She will remain out of work in the interim now, note given. She will follow up 2 weeks post-op from L TKA for suture removal and xrays (should coincide with her recheck of R TKA as well).  Plan LEFT total knee replacement  Signed electronically by Jaclyn Bissell PA-C for Dr. Beane  

## 2014-05-16 ENCOUNTER — Ambulatory Visit (HOSPITAL_COMMUNITY)
Admission: RE | Admit: 2014-05-16 | Discharge: 2014-05-16 | Disposition: A | Discharge status: Home / Self Care | Payer: BLUE CROSS/BLUE SHIELD | Source: Ambulatory Visit | Attending: Orthopedic Surgery | Admitting: Orthopedic Surgery

## 2014-05-16 ENCOUNTER — Encounter (HOSPITAL_COMMUNITY): Payer: Self-pay

## 2014-05-16 ENCOUNTER — Encounter (HOSPITAL_COMMUNITY)
Admission: RE | Admit: 2014-05-16 | Discharge: 2014-05-16 | Disposition: A | Discharge status: Home / Self Care | Payer: BLUE CROSS/BLUE SHIELD | Source: Ambulatory Visit | Attending: Specialist | Admitting: Specialist

## 2014-05-16 DIAGNOSIS — M1712 Unilateral primary osteoarthritis, left knee: Secondary | ICD-10-CM

## 2014-05-16 DIAGNOSIS — Z01818 Encounter for other preprocedural examination: Secondary | ICD-10-CM | POA: Diagnosis not present

## 2014-05-16 HISTORY — DX: Major depressive disorder, single episode, unspecified: F32.9

## 2014-05-16 HISTORY — DX: Depression, unspecified: F32.A

## 2014-05-16 LAB — URINALYSIS, ROUTINE W REFLEX MICROSCOPIC
Bilirubin Urine: NEGATIVE
GLUCOSE, UA: NEGATIVE mg/dL
HGB URINE DIPSTICK: NEGATIVE
Ketones, ur: NEGATIVE mg/dL
LEUKOCYTES UA: NEGATIVE
Nitrite: NEGATIVE
Protein, ur: NEGATIVE mg/dL
Specific Gravity, Urine: 1.011 (ref 1.005–1.030)
UROBILINOGEN UA: 0.2 mg/dL (ref 0.0–1.0)
pH: 6.5 (ref 5.0–8.0)

## 2014-05-16 LAB — CBC
HEMATOCRIT: 38.1 % (ref 36.0–46.0)
Hemoglobin: 12.5 g/dL (ref 12.0–15.0)
MCH: 30.8 pg (ref 26.0–34.0)
MCHC: 32.8 g/dL (ref 30.0–36.0)
MCV: 93.8 fL (ref 78.0–100.0)
PLATELETS: 221 10*3/uL (ref 150–400)
RBC: 4.06 MIL/uL (ref 3.87–5.11)
RDW: 13.9 % (ref 11.5–15.5)
WBC: 6.2 10*3/uL (ref 4.0–10.5)

## 2014-05-16 LAB — BASIC METABOLIC PANEL
Anion gap: 9 (ref 5–15)
BUN: 15 mg/dL (ref 6–23)
CHLORIDE: 101 mmol/L (ref 96–112)
CO2: 27 mmol/L (ref 19–32)
Calcium: 10 mg/dL (ref 8.4–10.5)
Creatinine, Ser: 0.84 mg/dL (ref 0.50–1.10)
GFR calc Af Amer: 88 mL/min — ABNORMAL LOW (ref >90)
GFR calc non Af Amer: 76 mL/min — ABNORMAL LOW (ref >90)
Glucose, Bld: 110 mg/dL — ABNORMAL HIGH (ref 70–99)
Potassium: 5.1 mmol/L (ref 3.5–5.1)
Sodium: 137 mmol/L (ref 135–145)

## 2014-05-16 LAB — APTT: aPTT: 37 seconds (ref 24–37)

## 2014-05-16 LAB — SURGICAL PCR SCREEN
MRSA, PCR: NEGATIVE
Staphylococcus aureus: NEGATIVE

## 2014-05-16 LAB — PROTIME-INR
INR: 1.03 (ref 0.00–1.49)
Prothrombin Time: 13.6 seconds (ref 11.6–15.2)

## 2014-05-16 NOTE — Patient Instructions (Addendum)
YOUR PROCEDURE IS SCHEDULED ON :  05/24/14  REPORT TO [facility] MAIN ENTRANCE FOLLOW SIGNS TO SHORT STAY CENTER AT :  5:30 AM  CALL THIS NUMBER IF YOU HAVE PROBLEMS THE MORNING OF SURGERY [phone]  REMEMBER:  DO NOT EAT FOOD OR DRINK LIQUIDS AFTER MIDNIGHT  TAKE THESE MEDICINES THE MORNING OF SURGERY:  WELLBUTRIN / SYNTHROID / MAY TAKE PERCOCET IF NEEDED FOR PAIN  YOU MAY NOT HAVE ANY METAL ON YOUR BODY INCLUDING HAIR PINS AND PIERCING'S. DO NOT WEAR JEWELRY, MAKEUP, LOTIONS, POWDERS OR PERFUMES. DO NOT WEAR NAIL POLISH. DO NOT SHAVE 48 HRS PRIOR TO SURGERY. MEN MAY SHAVE FACE AND NECK.  DO NOT BRING VALUABLES TO HOSPITAL. [facility] IS NOT RESPONSIBLE FOR VALUABLES.  CONTACTS, DENTURES OR PARTIALS MAY NOT BE WORN TO SURGERY. LEAVE SUITCASE IN CAR. CAN BE BROUGHT TO ROOM AFTER SURGERY.  PATIENTS DISCHARGED THE DAY OF SURGERY WILL NOT BE ALLOWED TO DRIVE HOME.  PLEASE READ OVER THE FOLLOWING INSTRUCTION SHEETS _________________________________________________________________________________                                          [facility] - PREPARING FOR SURGERY  Before surgery, you can play an important role.  Because skin is not sterile, your skin needs to be as free of germs as possible.  You can reduce the number of germs on your skin by washing with CHG (chlorahexidine gluconate) soap before surgery.  CHG is an antiseptic cleaner which kills germs and bonds with the skin to continue killing germs even after washing. Please DO NOT use if you have an allergy to CHG or antibacterial soaps.  If your skin becomes reddened/irritated stop using the CHG and inform your nurse when you arrive at Short Stay. Do not shave (including legs and underarms) for at least 48 hours prior to the first CHG shower.  You may shave your face. Please follow these instructions carefully:   1.  Shower with CHG Soap the night before surgery and the  morning of Surgery.   2.  If  you choose to wash your hair, wash your hair first as usual with your  normal  Shampoo.   3.  After you shampoo, rinse your hair and body thoroughly to remove the  shampoo.                                         4.  Use CHG as you would any other liquid soap.  You can apply chg directly  to the skin and wash . Gently wash with scrungie or clean wascloth    5.  Apply the CHG Soap to your body ONLY FROM THE NECK DOWN.   Do not use on open                           Wound or open sores. Avoid contact with eyes, ears mouth and genitals (private parts).                        Genitals (private parts) with your normal soap.              6.  Wash thoroughly, paying special attention to  the area where your surgery  will be performed.   7.  Thoroughly rinse your body with warm water from the neck down.   8.  DO NOT shower/wash with your normal soap after using and rinsing off  the CHG Soap .                9.  Pat yourself dry with a clean towel.             10.  Wear clean night clothes to bed after shower             11.  Place clean sheets on your bed the night of your first shower and do not  sleep with pets.  Day of Surgery : Do not apply any lotions/deodorants the morning of surgery.  Please wear clean clothes to the hospital/surgery center.  FAILURE TO FOLLOW THESE INSTRUCTIONS MAY RESULT IN THE CANCELLATION OF YOUR SURGERY    PATIENT SIGNATURE_________________________________  ______________________________________________________________________     [name]  An incentive spirometer is a tool that can help keep your lungs clear and active. This tool measures how well you are filling your lungs with each breath. Taking long deep breaths may help reverse or decrease the chance of developing breathing (pulmonary) problems (especially infection) following:  A long period of time when you are unable to move or be active. BEFORE THE PROCEDURE   If the spirometer  includes an indicator to show your best effort, your nurse or respiratory therapist will set it to a desired goal.  If possible, sit up straight or lean slightly forward. Try not to slouch.  Hold the incentive spirometer in an upright position. INSTRUCTIONS FOR USE  1. Sit on the edge of your bed if possible, or sit up as far as you can in bed or on a chair. 2. Hold the incentive spirometer in an upright position. 3. Breathe out normally. 4. Place the mouthpiece in your mouth and seal your lips tightly around it. 5. Breathe in slowly and as deeply as possible, raising the piston or the ball toward the top of the column. 6. Hold your breath for 3-5 seconds or for as long as possible. Allow the piston or ball to fall to the bottom of the column. 7. Remove the mouthpiece from your mouth and breathe out normally. 8. Rest for a few seconds and repeat Steps 1 through 7 at least 10 times every 1-2 hours when you are awake. Take your time and take a few normal breaths between deep breaths. 9. The spirometer may include an indicator to show your best effort. Use the indicator as a goal to work toward during each repetition. 10. After each set of 10 deep breaths, practice coughing to be sure your lungs are clear. If you have an incision (the cut made at the time of surgery), support your incision when coughing by placing a pillow or rolled up towels firmly against it. Once you are able to get out of bed, walk around indoors and cough well. You may stop using the incentive spirometer when instructed by your caregiver.  RISKS AND COMPLICATIONS  Take your time so you do not get dizzy or light-headed.  If you are in pain, you may need to take or ask for pain medication before doing incentive spirometry. It is harder to take a deep breath if you are having pain. AFTER USE  Rest and breathe slowly and easily.  It can be helpful to keep track  of a log of your progress. Your caregiver can provide you with a  simple table to help with this. If you are using the spirometer at home, follow these instructions: SEEK MEDICAL CARE IF:   You are having difficultly using the spirometer.  You have trouble using the spirometer as often as instructed.  Your pain medication is not giving enough relief while using the spirometer.  You develop fever of 100.5 F (38.1 C) or higher. SEEK IMMEDIATE MEDICAL CARE IF:   You cough up bloody sputum that had not been present before.  You develop fever of 102 F (38.9 C) or greater.  You develop worsening pain at or near the incision site. MAKE SURE YOU:   Understand these instructions.  Will watch your condition.  Will get help right away if you are not doing well or get worse. Document Released: 05/18/2006 Document Revised: 03/30/2011 Document Reviewed: 07/19/2006 [facility] Patient Information 2014 [place], [place].   ________________________________________________________________________

## 2014-05-24 ENCOUNTER — Encounter (HOSPITAL_COMMUNITY)
Admission: RE | Disposition: A | Discharge status: Home Health | Payer: Self-pay | Source: Ambulatory Visit | Attending: Specialist

## 2014-05-24 ENCOUNTER — Inpatient Hospital Stay (HOSPITAL_COMMUNITY): Payer: BLUE CROSS/BLUE SHIELD

## 2014-05-24 ENCOUNTER — Inpatient Hospital Stay (HOSPITAL_COMMUNITY)
Admission: RE | Admit: 2014-05-24 | Discharge: 2014-05-26 | DRG: 470 | Disposition: A | Discharge status: Home Health | Payer: BLUE CROSS/BLUE SHIELD | Source: Ambulatory Visit | Attending: Specialist | Admitting: Specialist

## 2014-05-24 ENCOUNTER — Encounter (HOSPITAL_COMMUNITY): Payer: Self-pay | Admitting: *Deleted

## 2014-05-24 ENCOUNTER — Inpatient Hospital Stay (HOSPITAL_COMMUNITY): Payer: BLUE CROSS/BLUE SHIELD | Admitting: Anesthesiology

## 2014-05-24 DIAGNOSIS — Z7982 Long term (current) use of aspirin: Secondary | ICD-10-CM | POA: Diagnosis not present

## 2014-05-24 DIAGNOSIS — E039 Hypothyroidism, unspecified: Secondary | ICD-10-CM | POA: Diagnosis present

## 2014-05-24 DIAGNOSIS — Z87891 Personal history of nicotine dependence: Secondary | ICD-10-CM

## 2014-05-24 DIAGNOSIS — M1712 Unilateral primary osteoarthritis, left knee: Secondary | ICD-10-CM | POA: Diagnosis present

## 2014-05-24 DIAGNOSIS — Z96651 Presence of right artificial knee joint: Secondary | ICD-10-CM | POA: Diagnosis present

## 2014-05-24 DIAGNOSIS — Z79899 Other long term (current) drug therapy: Secondary | ICD-10-CM

## 2014-05-24 DIAGNOSIS — Z96652 Presence of left artificial knee joint: Secondary | ICD-10-CM

## 2014-05-24 DIAGNOSIS — Z801 Family history of malignant neoplasm of trachea, bronchus and lung: Secondary | ICD-10-CM | POA: Diagnosis not present

## 2014-05-24 DIAGNOSIS — M25562 Pain in left knee: Secondary | ICD-10-CM | POA: Diagnosis present

## 2014-05-24 DIAGNOSIS — I10 Essential (primary) hypertension: Secondary | ICD-10-CM | POA: Diagnosis present

## 2014-05-24 HISTORY — PX: TOTAL KNEE ARTHROPLASTY: SHX125

## 2014-05-24 LAB — TYPE AND SCREEN
ABO/RH(D): O NEG
ANTIBODY SCREEN: NEGATIVE

## 2014-05-24 SURGERY — ARTHROPLASTY, KNEE, TOTAL
Anesthesia: General | Site: Knee | Laterality: Left

## 2014-05-24 MED ORDER — ONDANSETRON HCL 4 MG/2ML IJ SOLN
INTRAMUSCULAR | Status: DC | PRN
  Administered 2014-05-24: 4 mg via INTRAVENOUS

## 2014-05-24 MED ORDER — LIDOCAINE HCL (CARDIAC) 20 MG/ML IV SOLN
INTRAVENOUS | Status: AC
  Filled 2014-05-24: qty 5

## 2014-05-24 MED ORDER — RIVAROXABAN 10 MG PO TABS
10.0000 mg | ORAL_TABLET | Freq: Every day | ORAL | Status: DC
  Administered 2014-05-25 – 2014-05-26 (×2): 10 mg via ORAL
  Filled 2014-05-24 (×3): qty 1

## 2014-05-24 MED ORDER — LEVOTHYROXINE SODIUM 75 MCG PO TABS
75.0000 ug | ORAL_TABLET | Freq: Every day | ORAL | Status: DC
  Administered 2014-05-25 – 2014-05-26 (×2): 75 ug via ORAL
  Filled 2014-05-24 (×3): qty 1

## 2014-05-24 MED ORDER — PROPOFOL 10 MG/ML IV BOLUS
INTRAVENOUS | Status: AC
  Filled 2014-05-24: qty 20

## 2014-05-24 MED ORDER — BUPIVACAINE-EPINEPHRINE (PF) 0.25% -1:200000 IJ SOLN
INTRAMUSCULAR | Status: AC
  Filled 2014-05-24: qty 30

## 2014-05-24 MED ORDER — METOCLOPRAMIDE HCL 5 MG/ML IJ SOLN
INTRAMUSCULAR | Status: AC
Start: 2014-05-24 — End: 2014-05-24
  Filled 2014-05-24: qty 2

## 2014-05-24 MED ORDER — LISINOPRIL-HYDROCHLOROTHIAZIDE 10-12.5 MG PO TABS: 1.0000 | ORAL_TABLET | Freq: Every day | ORAL | Status: DC

## 2014-05-24 MED ORDER — ONDANSETRON HCL 4 MG/2ML IJ SOLN
INTRAMUSCULAR | Status: AC
  Filled 2014-05-24: qty 2

## 2014-05-24 MED ORDER — CEFAZOLIN SODIUM-DEXTROSE 2-3 GM-% IV SOLR
INTRAVENOUS | Status: AC
  Filled 2014-05-24: qty 50

## 2014-05-24 MED ORDER — CEFAZOLIN SODIUM-DEXTROSE 2-3 GM-% IV SOLR
2.0000 g | INTRAVENOUS | Status: AC
  Administered 2014-05-24: 2 g via INTRAVENOUS

## 2014-05-24 MED ORDER — ONDANSETRON HCL 8 MG PO TABS: 8.0000 mg | ORAL_TABLET | Freq: Three times a day (TID) | ORAL | Status: DC | PRN

## 2014-05-24 MED ORDER — DEXAMETHASONE SODIUM PHOSPHATE 4 MG/ML IJ SOLN
INTRAMUSCULAR | Status: DC | PRN
  Administered 2014-05-24: 10 mg via INTRAVENOUS

## 2014-05-24 MED ORDER — ALUM & MAG HYDROXIDE-SIMETH 200-200-20 MG/5ML PO SUSP: 30.0000 mL | ORAL | Status: DC | PRN

## 2014-05-24 MED ORDER — ONDANSETRON HCL 4 MG PO TABS: 4.0000 mg | ORAL_TABLET | Freq: Four times a day (QID) | ORAL | Status: DC | PRN

## 2014-05-24 MED ORDER — PROPOFOL 10 MG/ML IV BOLUS
INTRAVENOUS | Status: DC | PRN
  Administered 2014-05-24: 150 mg via INTRAVENOUS

## 2014-05-24 MED ORDER — DOCUSATE SODIUM 100 MG PO CAPS: 100.0000 mg | ORAL_CAPSULE | Freq: Two times a day (BID) | ORAL | Status: DC | PRN

## 2014-05-24 MED ORDER — HYDROCHLOROTHIAZIDE 12.5 MG PO CAPS
12.5000 mg | ORAL_CAPSULE | Freq: Every day | ORAL | Status: DC
  Administered 2014-05-26: 12.5 mg via ORAL
  Filled 2014-05-24 (×2): qty 1

## 2014-05-24 MED ORDER — DEXTROSE 5 % IV SOLN
500.0000 mg | Freq: Four times a day (QID) | INTRAVENOUS | Status: DC | PRN
  Administered 2014-05-24 (×2): 500 mg via INTRAVENOUS
  Filled 2014-05-24 (×4): qty 5

## 2014-05-24 MED ORDER — PHENYLEPHRINE HCL 10 MG/ML IJ SOLN
INTRAMUSCULAR | Status: DC | PRN
  Administered 2014-05-24 (×2): 40 ug via INTRAVENOUS
  Administered 2014-05-24 (×4): 80 ug via INTRAVENOUS

## 2014-05-24 MED ORDER — MIDAZOLAM HCL 2 MG/2ML IJ SOLN
INTRAMUSCULAR | Status: AC
  Filled 2014-05-24: qty 2

## 2014-05-24 MED ORDER — CLINDAMYCIN PHOSPHATE 900 MG/50ML IV SOLN
900.0000 mg | INTRAVENOUS | Status: AC
  Administered 2014-05-24: 900 mg via INTRAVENOUS

## 2014-05-24 MED ORDER — KETAMINE HCL 10 MG/ML IJ SOLN
INTRAMUSCULAR | Status: AC
  Filled 2014-05-24: qty 1

## 2014-05-24 MED ORDER — HYDROMORPHONE HCL 1 MG/ML IJ SOLN
INTRAMUSCULAR | Status: AC
  Filled 2014-05-24: qty 1

## 2014-05-24 MED ORDER — SCOPOLAMINE 1 MG/3DAYS TD PT72
MEDICATED_PATCH | TRANSDERMAL | Status: AC
  Filled 2014-05-24: qty 1

## 2014-05-24 MED ORDER — ONDANSETRON HCL 8 MG PO TABS
8.0000 mg | ORAL_TABLET | Freq: Three times a day (TID) | ORAL | Status: DC | PRN
  Administered 2014-05-24: 8 mg via ORAL
  Filled 2014-05-24 (×2): qty 1
  Filled 2014-05-24: qty 2

## 2014-05-24 MED ORDER — RIVAROXABAN 10 MG PO TABS: 10.0000 mg | ORAL_TABLET | Freq: Every day | ORAL | Status: DC

## 2014-05-24 MED ORDER — LACTATED RINGERS IV SOLN
INTRAVENOUS | Status: DC
  Administered 2014-05-24 (×3): via INTRAVENOUS

## 2014-05-24 MED ORDER — LIDOCAINE HCL (CARDIAC) 20 MG/ML IV SOLN
INTRAVENOUS | Status: DC | PRN
  Administered 2014-05-24: 50 mg via INTRAVENOUS

## 2014-05-24 MED ORDER — RISAQUAD PO CAPS
1.0000 | ORAL_CAPSULE | Freq: Every day | ORAL | Status: DC
  Administered 2014-05-24 – 2014-05-26 (×3): 1 via ORAL
  Filled 2014-05-24 (×4): qty 1

## 2014-05-24 MED ORDER — DIPHENHYDRAMINE HCL 50 MG/ML IJ SOLN
INTRAMUSCULAR | Status: AC
  Filled 2014-05-24: qty 1

## 2014-05-24 MED ORDER — OXYCODONE-ACETAMINOPHEN 7.5-325 MG PO TABS: 1.0000 | ORAL_TABLET | ORAL | Status: DC | PRN

## 2014-05-24 MED ORDER — MENTHOL 3 MG MT LOZG: 1.0000 | LOZENGE | OROMUCOSAL | Status: DC | PRN

## 2014-05-24 MED ORDER — OXYCODONE HCL 5 MG PO TABS
5.0000 mg | ORAL_TABLET | ORAL | Status: DC | PRN
  Administered 2014-05-24 (×2): 10 mg via ORAL
  Administered 2014-05-24: 5 mg via ORAL
  Administered 2014-05-25 – 2014-05-26 (×9): 10 mg via ORAL
  Filled 2014-05-24 (×3): qty 2
  Filled 2014-05-24: qty 1
  Filled 2014-05-24 (×6): qty 2
  Filled 2014-05-24: qty 1
  Filled 2014-05-24 (×2): qty 2

## 2014-05-24 MED ORDER — ACETAMINOPHEN 650 MG RE SUPP: 650.0000 mg | Freq: Four times a day (QID) | RECTAL | Status: DC | PRN

## 2014-05-24 MED ORDER — DIPHENHYDRAMINE HCL 50 MG/ML IJ SOLN
INTRAMUSCULAR | Status: DC | PRN
  Administered 2014-05-24: 12.5 mg via INTRAVENOUS

## 2014-05-24 MED ORDER — METOCLOPRAMIDE HCL 5 MG/ML IJ SOLN
INTRAMUSCULAR | Status: DC | PRN
  Administered 2014-05-24: 10 mg via INTRAVENOUS

## 2014-05-24 MED ORDER — STERILE WATER FOR IRRIGATION IR SOLN
Status: DC | PRN
  Administered 2014-05-24: 1500 mL

## 2014-05-24 MED ORDER — MIDAZOLAM HCL 5 MG/5ML IJ SOLN
INTRAMUSCULAR | Status: DC | PRN
  Administered 2014-05-24 (×2): 1 mg via INTRAVENOUS

## 2014-05-24 MED ORDER — METHOCARBAMOL 500 MG PO TABS: 500.0000 mg | ORAL_TABLET | Freq: Three times a day (TID) | ORAL | Status: DC

## 2014-05-24 MED ORDER — DEXAMETHASONE SODIUM PHOSPHATE 10 MG/ML IJ SOLN
INTRAMUSCULAR | Status: AC
  Filled 2014-05-24: qty 1

## 2014-05-24 MED ORDER — SENNOSIDES-DOCUSATE SODIUM 8.6-50 MG PO TABS: 1.0000 | ORAL_TABLET | Freq: Every evening | ORAL | Status: DC | PRN

## 2014-05-24 MED ORDER — KETAMINE HCL 10 MG/ML IJ SOLN
INTRAMUSCULAR | Status: DC | PRN
  Administered 2014-05-24: 10 mg via INTRAVENOUS

## 2014-05-24 MED ORDER — BUPIVACAINE-EPINEPHRINE 0.25% -1:200000 IJ SOLN
INTRAMUSCULAR | Status: DC | PRN
Start: 2014-05-24 — End: 2014-05-24
  Administered 2014-05-24: 30 mL

## 2014-05-24 MED ORDER — KCL IN DEXTROSE-NACL 20-5-0.45 MEQ/L-%-% IV SOLN
INTRAVENOUS | Status: AC
  Administered 2014-05-24: 14:00:00 via INTRAVENOUS
  Filled 2014-05-24 (×2): qty 1000

## 2014-05-24 MED ORDER — METHOCARBAMOL 500 MG PO TABS
500.0000 mg | ORAL_TABLET | Freq: Four times a day (QID) | ORAL | Status: DC | PRN
  Administered 2014-05-24 – 2014-05-26 (×4): 500 mg via ORAL
  Filled 2014-05-24 (×4): qty 1

## 2014-05-24 MED ORDER — BISACODYL 5 MG PO TBEC: 5.0000 mg | DELAYED_RELEASE_TABLET | Freq: Every day | ORAL | Status: DC | PRN

## 2014-05-24 MED ORDER — METOCLOPRAMIDE HCL 5 MG PO TABS
5.0000 mg | ORAL_TABLET | Freq: Three times a day (TID) | ORAL | Status: DC | PRN
  Filled 2014-05-24: qty 2

## 2014-05-24 MED ORDER — HYDROMORPHONE HCL 1 MG/ML IJ SOLN
1.0000 mg | INTRAMUSCULAR | Status: DC | PRN
  Administered 2014-05-24 – 2014-05-26 (×10): 1 mg via INTRAVENOUS
  Filled 2014-05-24 (×10): qty 1

## 2014-05-24 MED ORDER — HYDROMORPHONE HCL 1 MG/ML IJ SOLN
INTRAMUSCULAR | Status: DC | PRN
  Administered 2014-05-24 (×2): 1 mg via INTRAVENOUS

## 2014-05-24 MED ORDER — SODIUM CHLORIDE 0.9 % IR SOLN
Status: DC | PRN
  Administered 2014-05-24: 3000 mL

## 2014-05-24 MED ORDER — SCOPOLAMINE 1 MG/3DAYS TD PT72
MEDICATED_PATCH | TRANSDERMAL | Status: DC | PRN
  Administered 2014-05-24: 1 via TRANSDERMAL

## 2014-05-24 MED ORDER — SODIUM CHLORIDE 0.9 % IJ SOLN
INTRAMUSCULAR | Status: AC
  Filled 2014-05-24: qty 50

## 2014-05-24 MED ORDER — MAGNESIUM CITRATE PO SOLN: 1.0000 | Freq: Once | ORAL | Status: AC | PRN

## 2014-05-24 MED ORDER — SUCCINYLCHOLINE CHLORIDE 20 MG/ML IJ SOLN
INTRAMUSCULAR | Status: DC | PRN
  Administered 2014-05-24: 100 mg via INTRAVENOUS

## 2014-05-24 MED ORDER — SODIUM CHLORIDE 0.9 % IR SOLN
Status: DC | PRN
  Administered 2014-05-24: 500 mL

## 2014-05-24 MED ORDER — BUPROPION HCL ER (XL) 300 MG PO TB24
300.0000 mg | ORAL_TABLET | Freq: Every morning | ORAL | Status: DC
  Administered 2014-05-25 – 2014-05-26 (×2): 300 mg via ORAL
  Filled 2014-05-24 (×2): qty 1

## 2014-05-24 MED ORDER — DOCUSATE SODIUM 100 MG PO CAPS
100.0000 mg | ORAL_CAPSULE | Freq: Two times a day (BID) | ORAL | Status: DC
  Administered 2014-05-24 – 2014-05-26 (×4): 100 mg via ORAL

## 2014-05-24 MED ORDER — PROMETHAZINE HCL 25 MG/ML IJ SOLN: 6.2500 mg | INTRAMUSCULAR | Status: DC | PRN

## 2014-05-24 MED ORDER — ACETAMINOPHEN 325 MG PO TABS
650.0000 mg | ORAL_TABLET | Freq: Four times a day (QID) | ORAL | Status: DC | PRN
  Administered 2014-05-25: 650 mg via ORAL
  Filled 2014-05-24: qty 2

## 2014-05-24 MED ORDER — HYDROMORPHONE HCL 1 MG/ML IJ SOLN
0.2500 mg | INTRAMUSCULAR | Status: DC | PRN
  Administered 2014-05-24: 0.5 mg via INTRAVENOUS
  Administered 2014-05-24 (×2): 0.25 mg via INTRAVENOUS
  Administered 2014-05-24 (×2): 0.5 mg via INTRAVENOUS

## 2014-05-24 MED ORDER — CLINDAMYCIN PHOSPHATE 900 MG/50ML IV SOLN
900.0000 mg | Freq: Once | INTRAVENOUS | Status: AC
  Administered 2014-05-24: 900 mg via INTRAVENOUS
  Filled 2014-05-24: qty 50

## 2014-05-24 MED ORDER — HYDROMORPHONE HCL 2 MG/ML IJ SOLN
INTRAMUSCULAR | Status: AC
  Filled 2014-05-24: qty 1

## 2014-05-24 MED ORDER — CLINDAMYCIN PHOSPHATE 900 MG/50ML IV SOLN
INTRAVENOUS | Status: AC
  Filled 2014-05-24: qty 50

## 2014-05-24 MED ORDER — LISINOPRIL 10 MG PO TABS
10.0000 mg | ORAL_TABLET | Freq: Every day | ORAL | Status: DC
  Administered 2014-05-26: 10 mg via ORAL
  Filled 2014-05-24 (×2): qty 1

## 2014-05-24 MED ORDER — FENTANYL CITRATE (PF) 250 MCG/5ML IJ SOLN
INTRAMUSCULAR | Status: AC
  Filled 2014-05-24: qty 5

## 2014-05-24 MED ORDER — ONDANSETRON HCL 4 MG/2ML IJ SOLN: 4.0000 mg | Freq: Four times a day (QID) | INTRAMUSCULAR | Status: DC | PRN

## 2014-05-24 MED ORDER — ACETAMINOPHEN 10 MG/ML IV SOLN
1000.0000 mg | Freq: Once | INTRAVENOUS | Status: AC
  Administered 2014-05-24: 1000 mg via INTRAVENOUS
  Filled 2014-05-24: qty 100

## 2014-05-24 MED ORDER — METOCLOPRAMIDE HCL 5 MG/ML IJ SOLN: 5.0000 mg | Freq: Three times a day (TID) | INTRAMUSCULAR | Status: DC | PRN

## 2014-05-24 MED ORDER — CEFAZOLIN SODIUM-DEXTROSE 2-3 GM-% IV SOLR
2.0000 g | Freq: Four times a day (QID) | INTRAVENOUS | Status: AC
  Administered 2014-05-24 – 2014-05-25 (×3): 2 g via INTRAVENOUS
  Filled 2014-05-24 (×3): qty 50

## 2014-05-24 MED ORDER — POLYMYXIN B SULFATE 500000 UNITS IJ SOLR
INTRAMUSCULAR | Status: AC
  Filled 2014-05-24: qty 1

## 2014-05-24 MED ORDER — KETOROLAC TROMETHAMINE 30 MG/ML IJ SOLN
INTRAMUSCULAR | Status: AC
  Filled 2014-05-24: qty 1

## 2014-05-24 MED ORDER — FENTANYL CITRATE (PF) 100 MCG/2ML IJ SOLN
INTRAMUSCULAR | Status: DC | PRN
  Administered 2014-05-24: 50 ug via INTRAVENOUS
  Administered 2014-05-24: 100 ug via INTRAVENOUS
  Administered 2014-05-24 (×2): 50 ug via INTRAVENOUS

## 2014-05-24 MED ORDER — PHENYLEPHRINE 40 MCG/ML (10ML) SYRINGE FOR IV PUSH (FOR BLOOD PRESSURE SUPPORT)
PREFILLED_SYRINGE | INTRAVENOUS | Status: AC
  Filled 2014-05-24: qty 10

## 2014-05-24 MED ORDER — DIPHENHYDRAMINE HCL 12.5 MG/5ML PO ELIX
12.5000 mg | ORAL_SOLUTION | ORAL | Status: DC | PRN
  Administered 2014-05-25: 25 mg via ORAL
  Filled 2014-05-24: qty 10

## 2014-05-24 MED ORDER — PHENOL 1.4 % MT LIQD: 1.0000 | OROMUCOSAL | Status: DC | PRN

## 2014-05-24 SURGICAL SUPPLY — 74 items
BAG ZIPLOCK 12X15 (MISCELLANEOUS) IMPLANT
BANDAGE ELASTIC 4 VELCRO ST LF (GAUZE/BANDAGES/DRESSINGS) ×3 IMPLANT
BANDAGE ELASTIC 6 VELCRO ST LF (GAUZE/BANDAGES/DRESSINGS) ×3 IMPLANT
BANDAGE ESMARK 6X9 LF (GAUZE/BANDAGES/DRESSINGS) ×1 IMPLANT
BLADE SAG 18X100X1.27 (BLADE) ×3 IMPLANT
BLADE SAW SGTL 13.0X1.19X90.0M (BLADE) ×6 IMPLANT
BNDG ESMARK 6X9 LF (GAUZE/BANDAGES/DRESSINGS) ×3
CAP KNEE TOTAL 3 SIGMA ×3 IMPLANT
CEMENT HV SMART SET (Cement) ×6 IMPLANT
CHLORAPREP W/TINT 26ML (MISCELLANEOUS) IMPLANT
CLOSURE WOUND 1/2 X4 (GAUZE/BANDAGES/DRESSINGS)
CLOTH 2% CHLOROHEXIDINE 3PK (PERSONAL CARE ITEMS) ×3 IMPLANT
CUFF TOURN SGL QUICK 34 (TOURNIQUET CUFF) ×2
CUFF TRNQT CYL 34X4X40X1 (TOURNIQUET CUFF) ×1 IMPLANT
DECANTER SPIKE VIAL GLASS SM (MISCELLANEOUS) ×3 IMPLANT
DRAPE INCISE IOBAN 66X45 STRL (DRAPES) IMPLANT
DRAPE ORTHO SPLIT 77X108 STRL (DRAPES) ×4
DRAPE POUCH INSTRU U-SHP 10X18 (DRAPES) ×3 IMPLANT
DRAPE SHEET LG 3/4 BI-LAMINATE (DRAPES) ×3 IMPLANT
DRAPE SURG ORHT 6 SPLT 77X108 (DRAPES) ×2 IMPLANT
DRAPE U-SHAPE 47X51 STRL (DRAPES) ×3 IMPLANT
DRSG ADAPTIC 3X8 NADH LF (GAUZE/BANDAGES/DRESSINGS) IMPLANT
DRSG AQUACEL AG ADV 3.5X 6 (GAUZE/BANDAGES/DRESSINGS) ×3 IMPLANT
DRSG AQUACEL AG ADV 3.5X10 (GAUZE/BANDAGES/DRESSINGS) IMPLANT
DRSG PAD ABDOMINAL 8X10 ST (GAUZE/BANDAGES/DRESSINGS) IMPLANT
DRSG TEGADERM 4X4.75 (GAUZE/BANDAGES/DRESSINGS) ×3 IMPLANT
DURAPREP 26ML APPLICATOR (WOUND CARE) ×3 IMPLANT
ELECT REM PT RETURN 9FT ADLT (ELECTROSURGICAL) ×3
ELECTRODE REM PT RTRN 9FT ADLT (ELECTROSURGICAL) ×1 IMPLANT
EVACUATOR 1/8 PVC DRAIN (DRAIN) IMPLANT
FACESHIELD WRAPAROUND (MASK) ×15 IMPLANT
GAUZE SPONGE 2X2 8PLY STRL LF (GAUZE/BANDAGES/DRESSINGS) ×1 IMPLANT
GAUZE SPONGE 4X4 12PLY STRL (GAUZE/BANDAGES/DRESSINGS) IMPLANT
GLOVE BIOGEL PI IND STRL 7.5 (GLOVE) ×1 IMPLANT
GLOVE BIOGEL PI IND STRL 8 (GLOVE) ×1 IMPLANT
GLOVE BIOGEL PI INDICATOR 7.5 (GLOVE) ×2
GLOVE BIOGEL PI INDICATOR 8 (GLOVE) ×2
GLOVE SURG SS PI 7.5 STRL IVOR (GLOVE) ×3 IMPLANT
GLOVE SURG SS PI 8.0 STRL IVOR (GLOVE) ×6 IMPLANT
GOWN STRL REUS W/TWL XL LVL3 (GOWN DISPOSABLE) ×6 IMPLANT
HANDPIECE INTERPULSE COAX TIP (DISPOSABLE) ×2
IMMOBILIZER KNEE 20 (SOFTGOODS) ×3
IMMOBILIZER KNEE 20 THIGH 36 (SOFTGOODS) ×1 IMPLANT
KIT BASIN OR (CUSTOM PROCEDURE TRAY) ×3 IMPLANT
MANIFOLD NEPTUNE II (INSTRUMENTS) ×3 IMPLANT
NDL SAFETY ECLIPSE 18X1.5 (NEEDLE) ×1 IMPLANT
NEEDLE HYPO 18GX1.5 SHARP (NEEDLE) ×2
NS IRRIG 1000ML POUR BTL (IV SOLUTION) IMPLANT
PACK TOTAL JOINT (CUSTOM PROCEDURE TRAY) ×3 IMPLANT
PADDING CAST COTTON 6X4 STRL (CAST SUPPLIES) IMPLANT
PEN SKIN MARKING BROAD (MISCELLANEOUS) ×3 IMPLANT
POSITIONER SURGICAL ARM (MISCELLANEOUS) ×3 IMPLANT
SET HNDPC FAN SPRY TIP SCT (DISPOSABLE) ×1 IMPLANT
SPONGE GAUZE 2X2 STER 10/PKG (GAUZE/BANDAGES/DRESSINGS) ×2
SPONGE SURGIFOAM ABS GEL 100 (HEMOSTASIS) IMPLANT
STAPLER VISISTAT (STAPLE) IMPLANT
STRIP CLOSURE SKIN 1/2X4 (GAUZE/BANDAGES/DRESSINGS) IMPLANT
SUCTION FRAZIER 12FR DISP (SUCTIONS) ×3 IMPLANT
SUT BONE WAX W31G (SUTURE) IMPLANT
SUT MNCRL AB 4-0 PS2 18 (SUTURE) IMPLANT
SUT VIC AB 1 CT1 27 (SUTURE) ×2
SUT VIC AB 1 CT1 27XBRD ANTBC (SUTURE) ×1 IMPLANT
SUT VIC AB 1 CT1 36 (SUTURE) ×3 IMPLANT
SUT VIC AB 2-0 CT1 27 (SUTURE) ×6
SUT VIC AB 2-0 CT1 TAPERPNT 27 (SUTURE) ×3 IMPLANT
SUT VLOC 180 0 24IN GS25 (SUTURE) ×3 IMPLANT
SYR 50ML LL SCALE MARK (SYRINGE) ×3 IMPLANT
TOWEL OR 17X26 10 PK STRL BLUE (TOWEL DISPOSABLE) ×3 IMPLANT
TOWEL OR NON WOVEN STRL DISP B (DISPOSABLE) IMPLANT
TOWER CARTRIDGE SMART MIX (DISPOSABLE) ×3 IMPLANT
TRAY FOLEY W/METER SILVER 14FR (SET/KITS/TRAYS/PACK) ×3 IMPLANT
WATER STERILE IRR 1500ML POUR (IV SOLUTION) ×3 IMPLANT
WRAP KNEE MAXI GEL POST OP (GAUZE/BANDAGES/DRESSINGS) ×3 IMPLANT
YANKAUER SUCT BULB TIP 10FT TU (MISCELLANEOUS) ×3 IMPLANT

## 2014-05-24 NOTE — H&P (View-Only) (Signed)
[name]. [name] DOB: Oct 31, 1955 Divorced / Language: English / Race: White Female  Chief Complaint: Left knee pain  History of Present Illness  [name] follows up, 8 weeks s/p R TKA, doing well. She reports she is making progress in PT with her right knee, however, is not yet in full extension and is having trouble getting the knee in extension while walking due to her left knee flexion contracture. She is actually hoping to proceed with left TKA in another few weeks. She is taking Percocet 7.5mg  q4h prn, Robaxin prn, and ASA daily. She is taking a daily multivitamin with iron, her appetite has improved, her energy is improved as well. She is worried about post-op nausea which was an issue for her following her right knee (general anesthesia). She is out of work. She's still noting some numbness over the lateral aspect of her right knee. She's been working on some scar massage.  Allergies Latex Dermatitis. Zinc *MULTIVITAMINS*  Family History Cancer Father, Brother, Sister. Father deceased, lung CA Mother Living. age 57 Siblings one brother and one sister living both healthy. one brother deceased age 19- leukemia. one sister deceased age 13- brain tumor. Children In good health. 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 working full time Marital status Divorced. Current occupation Personal [name] Post-Surgical Plans home with HHPT. one level home with 2 steps to enter Advance Directives none  Medication History Robaxin (  Tablet, 1 (one) Tablet Oral every 6-8 hours as needed for spasms/pain, Taken starting 03/22/2014)  Active. Percocet (7.5-325MG  Tablet, 1-2 Oral every 4-6 hours as needed for pain, Taken starting 04/17/2014) Active. Aspirin (  Capsule, 1 (one) Oral) Active. Synthroid ( Tablet, Oral) Active. Lisinopril-Hydrochlorothiazide (10-12.5MG  Tablet, Oral) Active. BuPROPion HCl ER (XL) (  Tablet ER 24HR, Oral) Active. Vitamin D (Ergocalciferol) (50000UNIT Capsule, Oral) Active. Medications Reconciled  Past Surgical History Cesarean Delivery 4 Arthroscopic Knee Surgery - Both post-op N/V Hysterectomy Right total knee replacement- post-op N/V  Past Medical Hx Depression Hypothyroidism Cellulitis L leg 11/2013  Review of Systems General Not Present- Fever and Weight Loss. Skin Not Present- Rash. Cardiovascular Not Present- Chest Pain and Shortness of Breath. Musculoskeletal Present- Joint Pain, Joint Stiffness, Joint Swelling, Morning Stiffness, Muscle Pain and Muscle Weakness. Neurological Not Present- Difficulty with balance, Loss of bladder control, Loss of bowel control and Weakness. Endocrine Not Present- Excessive Thirst and Excessive Urination. Hematology Not Present- Easy Bruising.  Physical Exam General Mental Status -Alert, cooperative and good historian. General Appearance-pleasant, Not in acute distress. Orientation-Oriented X3. Build & Nutrition-Well nourished and Well developed.  Head and Neck Head-normocephalic, atraumatic . Neck Global Assessment - supple, no bruit auscultated on the right, no bruit auscultated on the left.  Eye Pupil - Bilateral-Regular and Round. Motion - Bilateral-EOMI.  Chest and Lung Exam Auscultation Breath sounds - clear at anterior chest wall and clear at posterior chest wall. Adventitious sounds - No Adventitious sounds.  Cardiovascular Auscultation Rhythm - Regular rate and rhythm. Heart Sounds - S1 WNL and S2 WNL. Murmurs & Other Heart Sounds - Auscultation of the heart reveals - No  Murmurs.  Abdomen Palpation/Percussion Tenderness - Abdomen is non-tender to palpation. Rigidity (guarding) - Abdomen is soft. Auscultation Auscultation of  the abdomen reveals - Bowel sounds normal.  Female Genitourinary Not done, not pertinent to present illness  Right Lower Extremity: Right Knee: Inspection and Palpation - Tenderness - medial knee tender to palpation(mild), no tenderness to palpation of the superior calf, no tenderness to palpation of the quadriceps tendon, no tenderness to palpation of the patellar tendon, no tenderness to palpation of the patella, no tenderness to palpation of the fibular head, no tenderness to palpation of the peroneal nerve. Swelling - mild. Tissue tension/texture is - soft. Pulses - 2+. Sensation - intact to light touch. Skin - Color - erythema present, no ecchymosis. Temperature - normal warmth. Wound/Incision - Appearance - The incision is well healed with no erythema or exudates. Swelling - there is no swelling around the incision. Healing - The incision is well healed and healing as expected. ROM: Flexion - AROM - 95 . PROM - 100 . Extension - AROM - 5 . PROM - 4 . Right Knee - Stability - No laxity present and No instability about the knee. Deformities/Malalignments/Discrepancies - no deformities noted. Left Lower Extremity: Left Knee: Inspection and Palpation - Tenderness - medial joint line tender to palpation, no tenderness to palpation of the superior calf, no tenderness to palpation of the quadriceps tendon, no tenderness to palpation of the patellar tendon, no tenderness to palpation of the patella, no tenderness to palpation of the lateral joint line, no tenderness to palpation of the fibular head, no tenderness to palpation of the peroneal nerve. Patellar Tendon - no pain to palpation of the patellar tendon. Swelling - periarticular swelling present. Effusion - trace. Tissue tension/texture is - soft. Pulses - 2+. Sensation - intact to light  touch. Alignment - Tibiofemoral Alignment - varus alignment. Skin - Color - no ecchymosis, no erythema. Strength and Tone - Quadriceps - 5/5. Hamstrings - 5/5. ROM: Flexion - AROM - 90 . Extension - AROM - 10 . Stability - Valgus Laxity at 30 - None. Valgus Laxity at 0 - None. Varus Laxity at 30 - None. Varus Laxity at 0 - None. Lachman - Negative. Anterior Drawer Test - Negative. Posterior Drawer Test - Negative. Left Knee - Deformities/Malalignments/Discrepancies - no deformities noted. Special Tests - McMurray Test (lateral) - negative. McMurray Test (medial) - negative. Patellar Compression Pain - mild pain.  Imaging Prior Left knee standing xrays with end-stage medial joint space narrowing, severe, bone-on-bone, with varus deformity.  Assessment & Plan Primary osteoarthritis of left knee (M17.12)  Pt doing well nearly 8 weeks s/p R TKA, progressing well with PT, -5 to 95 actively today. Recommend continued PT and HEP for work on ROM and strengthening, especially prior to undergoing her L TKA potentially in a few more weeks. Continue ice, elevation, scar massage, pain meds/muscle relaxers as needed, she will call for refills by request. She will also continue daily ASA for DVT ppx. Expect continued improvement in ROM with time and as she continues to work with PT. She will follow up for the right knee in 6 weeks for routine 12 week post-op ROM check. In regards to the left knee, she has severe end-stage arthrosis, worsening flexion contracture, ongoing pain limiting ADL's, and she would like to proceed with TKA on the left. At this point, recommend proceeding with left total knee replacement. Discussed the procedure itself as well as risks, complications and alternatives, including but not limited to DVT, PE, infx, bleeding, failure of procedure, need for secondary procedure including manipulation, nerve injury, ongoing pain/symptoms, anesthesia risk, even  stroke or death. Also discussed typical  post-op protocols, activity restrictions, need for PT, flexion/extension exercises, time out of work. Discussed need for DVT ppx post-op with Xarelto then ASA per protocol. Discussed dental ppx. Also discussed limitations post-operatively such as kneeling and squatting. All questions were answered. Patient desires to proceed with surgery. She will not require repeat medical clearance as her clearance is within 6 months and no complications following R TKA. Discussed her post-op nausea and vomiting last time, will make some changes this time to try to avoid that, will plan on spinal anesthesia, IV tylenol, possibly a scopalamine patch and decadron (per anesthesia's recommendations). Mutually agreed to waive her H&P. Discussed post-op medications, Percocet 7.5 or 10mg  depending on pain control, anti-emetic, Robaxin, Colace, Xarelto for DVT ppx. She previously tested staph aureus positive but MRSA negative, given that plus her recent hospital admission for total knee she was given MRSA decolonization Rx's to start a week pre-op for bactroban ointment and chlorhexidine wash. She will likely require up to 12 weeks out of work following her left knee. She will remain out of work in the interim now, note given. She will follow up 2 weeks post-op from L TKA for suture removal and xrays (should coincide with her recheck of R TKA as well).  Plan LEFT total knee replacement  Signed electronically by Andrez GrimeJaclyn Luna Audia PA-C for Dr. Shelle IronBeane

## 2014-05-24 NOTE — Anesthesia Preprocedure Evaluation (Addendum)
Anesthesia Evaluation  Patient identified by MRN, date of birth, ID band Patient awake    History of Anesthesia Complications (+) PONV  Airway Mallampati: II  TM Distance: >3 FB Neck ROM: Full    Dental   Pulmonary former smoker,  breath sounds clear to auscultation        Cardiovascular hypertension, Rhythm:Regular Rate:Normal     Neuro/Psych    GI/Hepatic negative GI ROS, Neg liver ROS,   Endo/Other  Hypothyroidism   Renal/GU negative Renal ROS     Musculoskeletal   Abdominal   Peds  Hematology   Anesthesia Other Findings   Reproductive/Obstetrics                            Anesthesia Physical Anesthesia Plan  ASA: II  Anesthesia Plan: General   Post-op Pain Management:    Induction: Intravenous  Airway Management Planned: Oral ETT  Additional Equipment:   Intra-op Plan:   Post-operative Plan: Extubation in OR  Informed Consent: I have reviewed the patients History and Physical, chart, labs and discussed the procedure including the risks, benefits and alternatives for the proposed anesthesia with the patient or authorized representative who has indicated his/her understanding and acceptance.   Dental advisory given  Plan Discussed with: CRNA and Anesthesiologist  Anesthesia Plan Comments:         Anesthesia Quick Evaluation

## 2014-05-24 NOTE — Care Management Note (Signed)
Case Management Note  Patient Details  Name: [name] MRN: [id number] Date of Birth: 03-15-1955  Subjective/Objective:                   LEFT TOTAL KNEE ARTHROPLASTY (Left) Action/Plan: Discharge planning Expected Discharge Date:  05/26/14               Expected Discharge Plan:  [place]  In-House Referral:     Discharge planning Services  CM Consult  Post Acute Care Choice:  Home Health Choice offered to:  NA  DME Arranged:    DME Agency:     HH Arranged:  PT HH Agency:  [facility]  Status of Service:  Completed, signed off  Medicare Important Message Given:    Date Medicare IM Given:    Medicare IM give by:    Date Additional Medicare IM Given:    Additional Medicare Important Message give by:     If discussed at [place] of Stay Meetings, dates discussed:    Additional Comments: CM met with pt to offer choice of home health agency.  Pt chooses [facility] to render HHPT.  Address and contact information verified by pt. Pt has support of adult children at home.  Pt has both a rolling walker and elevated commode seat.  Referral emailed to [employer], Tim.  No other CM needs were communicated.    Dellie Catholic, RN 05/24/2014, 1:29 PM

## 2014-05-24 NOTE — Brief Op Note (Signed)
05/24/2014  9:39 AM  PATIENT:  [name]  59 y.o. female  PRE-OPERATIVE DIAGNOSIS:  LEFT KNEE DJD   POST-OPERATIVE DIAGNOSIS:  LEFT KNEE DJD   PROCEDURE:  Procedure(s): LEFT TOTAL KNEE ARTHROPLASTY (Left)  SURGEON:  Surgeon(s) and Role:    * Jene EveryJeffrey Emmalea Treanor, MD - Primary  PHYSICIAN ASSISTANT:   ASSISTANTS: Bissell   ANESTHESIA:   general  EBL:  Total I/O In: 2000 [I.V.:2000] Out: 150 [Urine:150]  BLOOD ADMINISTERED:none  DRAINS: none   LOCAL MEDICATIONS USED:  MARCAINE     SPECIMEN:  No Specimen  DISPOSITION OF SPECIMEN:  N/A  COUNTS:  YES  TOURNIQUET:   Total Tourniquet Time Documented: Thigh (Left) - 77 minutes Total: Thigh (Left) - 77 minutes   DICTATION: .Other Dictation: Dictation Number [id number]  PLAN OF CARE: Admit to inpatient   PATIENT DISPOSITION:  PACU - hemodynamically stable.   Delay start of Pharmacological VTE agent (>24hrs) due to surgical blood loss or risk of bleeding: no

## 2014-05-24 NOTE — Anesthesia Postprocedure Evaluation (Signed)
  Anesthesia Post-op Note  Patient: [name]  Procedure(s) Performed: Procedure(s): LEFT TOTAL KNEE ARTHROPLASTY (Left)  Patient Location: PACU  Anesthesia Type:General  Level of Consciousness: awake  Airway and Oxygen Therapy: Patient Spontanous Breathing  Post-op Pain: mild  Post-op Assessment: Post-op Vital signs reviewed  Post-op Vital Signs: Reviewed  Last Vitals:  Filed Vitals:   05/24/14 1015  BP: 114/62  Pulse: 90  Temp:   Resp: 13    Complications: No apparent anesthesia complications

## 2014-05-24 NOTE — Interval H&P Note (Signed)
History and Physical Interval Note:  05/24/2014 7:25 AM  [name]  has presented today for surgery, with the diagnosis of LEFT KNEE DJD   The various methods of treatment have been discussed with the patient and family. After consideration of risks, benefits and other options for treatment, the patient has consented to  Procedure(s): LEFT TOTAL KNEE ARTHROPLASTY (Left) as a surgical intervention .  The patient's history has been reviewed, patient examined, no change in status, stable for surgery.  I have reviewed the patient's chart and labs.  Questions were answered to the patient's satisfaction.     Zacharee Gaddie C

## 2014-05-24 NOTE — Transfer of Care (Signed)
Immediate Anesthesia Transfer of Care Note  Patient: [name]  Procedure(s) Performed: Procedure(s): LEFT TOTAL KNEE ARTHROPLASTY (Left)  Patient Location: PACU  Anesthesia Type:General  Level of Consciousness: Patient easily awoken, sedated, comfortable, cooperative, following commands, responds to stimulation.   Airway & Oxygen Therapy: Patient spontaneously breathing, ventilating well, oxygen via simple oxygen mask.  Post-op Assessment: Report given to PACU RN, vital signs reviewed and stable, moving all extremities.   Post vital signs: Reviewed and stable.  Complications: No apparent anesthesia complications

## 2014-05-24 NOTE — Op Note (Signed)
NAMMinerva Fester:  [name], [name]                  ACCOUNT NO.:  [id number]  MEDICAL RECORD NO.:  [id number]  LOCATION:  1608                         [facility]  PHYSICIAN:  Jene EveryJeffrey Mohsen Odenthal, M.D.    DATE OF BIRTH:  [birth year]  DATE OF PROCEDURE:  05/24/2014 DATE OF DISCHARGE:                              OPERATIVE REPORT   PREOPERATIVE DIAGNOSES:  End-stage osteoarthritis, degenerative joint disease of the left knee.  POSTOPERATIVE DIAGNOSES:  End-stage osteoarthritis, degenerative joint disease of the left knee.  PROCEDURE PERFORMED:  Left total knee arthroplasty.  COMPONENTS:  DePuy rotating platform, 3 femur, 3 tibia, 10-mm insert, 38 patella.  ANESTHESIA:  General.  ASSISTANT:  Lanna PocheJacqueline Bissell, PA, who was acquired and used throughout the case for assisting, holding the patient, closure, and exposure.  HISTORY:  A 59 year old with bone-on-bone arthrosis, varus deformity, refractory to conservative treatment, severe negative effect to her activities of daily living, indicated for replacement of the degenerated joint.  Risk and benefits were discussed including bleeding, infection, damage to neurovascular structure, DVT, PE, anesthetic complications, suboptimal range of motion, persistent pain, need for revision, etc.  TECHNIQUE:  With the patient in supine position, after induction of adequate general anesthesia, 2 g of Kefzol and 600 mg of clindamycin, left lower extremity was prepped and draped in usual sterile fashion. Thigh tourniquet was inflated to 250 mmHg.  Midline incision was then made over the patella, full-thickness flaps developed.  Median parapatellar arthrotomy performed.  Patella everted.  Knee flexed. Elevated soft tissues medially.  Bone-on-bone arthrosis was noted.  We preserved the MCL attachment.  Tricompartmental osteoarthrosis was noted particularly medial compartment.  We removed osteophytes with bare rongeur.  Remnants of medial and lateral menisci were  removed. Geniculates were cauterized.  Step drill was utilized and entered into the femoral canal, was irrigated, 5-degree left, 11 off the distal femur was used.  Due to the flexion contracture, this was pinned and we performed our distal femoral cut.  We then sized the distal femur off the anterior cortex, it actually measured at 3.5.  This was pinned and then we used our external rotation guide 3 degrees and then we performed our anterior, posterior, and chamfer cuts.  We did not notch the cortex anteriorly.  Attention was turned towards the tibia.  We used Mikhail retractors, subluxed the tibia.  Severe osteoarthrosis medially, external alignment guide, 2 off the defect, which was medial, parallel to the shaft, slight slope, bisecting the tibiotalar joint.  Pinned, performed our oscillating saw.  We protected the soft tissues at all times.  With retractors, then used our flexion extension block, measured the gap, it was equivalent.  Then, we turned our attention towards completing the tibia, was sized to a 3, maximized the surface just to the medial aspect of the tibial tubercle, was pinned.  We performed our central drill and punch guide.  Then turned our attention towards completing the femur.  We used our block cut, bisecting the canal, just flushed to the lateral cortex, pinned and performed our box cut.  We then placed a 3 femur, 3 tibia, and 10-mm insert, and we had full flexion and -3 degrees of  extension.  It was stable on excellent patellofemoral tracking.  We then prepared the patella, was measured at 22.  We planed it to a 14 using the patellar guide.  We then selected a 38, drilled our PEG holes, medializing them and then placed a trial patella and we had excellent patellofemoral tracking.  We removed all the trials.  We released slightly more tissue medially and removed some osteophytes and released some of the posterior capsule.  This would gain additional extension.   Copiously irrigated the wound with pulsatile lavage, mixed the cement in the back table in appropriate fashion. Flexed the knee, used our retractors, thoroughly dried all surfaces, injected the cement in the tibia.  We digitally pressurized it and then we impacted our 3 tibial tray, redundant cement removed.  We cemented the femoral component, redundant cement removed.  Placed a trial 10 and reduced the knee and held it in extension with an axial load applied throughout the curing of the cement.  We cemented the patellar button as well.  During the curing of the cement, we irrigated it and then placed in a solution of Marcaine with epinephrine, saline and 1 mL of Toradol. After the appropriate curing of the cement, we flexed the knee, removed the insert and meticulously removed all redundant cement, copiously irrigated the wound with pulsatile lavage and antibiotic irrigation.  We used a 10-mm insert as that had excellent stability, this was inserted. The knee was reduced.  We had full extension, full flexion, and good stability with varus and valgus stressing at 0 to 30 degrees, negative anterior drawer and excellent patellofemoral tracking.  Next, we released the tourniquet at 77 minutes, cauterized any bleeders. Reapproximated the patellar arthrotomy with 1 Vicryl in slight flexion, and then a running V-Loc with excellent closure.  Prior to that, a Hemovac was placed and brought out through a lateral stab wound in the skin.  Wound copiously irrigated.  Subcu with multiple 2-0 layers and skin with subcuticular Monocryl.  Sterile dressing applied.  She had flexion to gravity at 90 degrees and excellent patellofemoral tracking and good stability.  The patient was placed in an immobilizer, extubated without difficulty, and transported to the recovery room in satisfactory condition.  The patient tolerated the procedure well.  No complications.  Assistant, Lanna PocheJacqueline Bissell, [place].  Minimal  blood loss.     Jene EveryJeffrey Leonardo Plaia, M.D.     Cordelia PenJB/MEDQ  D:  05/24/2014  T:  05/24/2014  Job:  [id number]

## 2014-05-24 NOTE — Anesthesia Procedure Notes (Signed)
Procedure Name: Intubation Date/Time: 05/24/2014 7:41 AM Performed by: Ludwig LeanJONES, Jolea Dolle C Pre-anesthesia Checklist: Patient identified, Emergency Drugs available, Suction available and Patient being monitored Patient Re-evaluated:Patient Re-evaluated prior to inductionOxygen Delivery Method: Circle System Utilized Preoxygenation: Pre-oxygenation with 100% oxygen Intubation Type: IV induction Ventilation: Mask ventilation without difficulty Laryngoscope Size: Mac and 3 Grade View: Grade I Tube type: Oral Tube size: 7.0 mm Number of attempts: 1 Airway Equipment and Method: Oral airway Placement Confirmation: ETT inserted through vocal cords under direct vision,  positive ETCO2 and breath sounds checked- equal and bilateral Secured at: 21 cm Tube secured with: Tape Dental Injury: Teeth and Oropharynx as per pre-operative assessment

## 2014-05-25 ENCOUNTER — Encounter (HOSPITAL_COMMUNITY): Payer: Self-pay | Admitting: Specialist

## 2014-05-25 LAB — CBC
HCT: 28.9 % — ABNORMAL LOW (ref 36.0–46.0)
HEMOGLOBIN: 9.5 g/dL — AB (ref 12.0–15.0)
MCH: 30.6 pg (ref 26.0–34.0)
MCHC: 32.9 g/dL (ref 30.0–36.0)
MCV: 93.2 fL (ref 78.0–100.0)
Platelets: 167 10*3/uL (ref 150–400)
RBC: 3.1 MIL/uL — AB (ref 3.87–5.11)
RDW: 13.9 % (ref 11.5–15.5)
WBC: 8.8 10*3/uL (ref 4.0–10.5)

## 2014-05-25 LAB — BASIC METABOLIC PANEL
Anion gap: 7 (ref 5–15)
BUN: 11 mg/dL (ref 6–20)
CO2: 26 mmol/L (ref 22–32)
CREATININE: 0.72 mg/dL (ref 0.44–1.00)
Calcium: 8.5 mg/dL — ABNORMAL LOW (ref 8.9–10.3)
Chloride: 103 mmol/L (ref 101–111)
Glucose, Bld: 121 mg/dL — ABNORMAL HIGH (ref 70–99)
Potassium: 3.9 mmol/L (ref 3.5–5.1)
Sodium: 136 mmol/L (ref 135–145)

## 2014-05-25 MED ORDER — HYDROCORTISONE 1 % EX CREA
TOPICAL_CREAM | Freq: Three times a day (TID) | CUTANEOUS | Status: DC | PRN
  Administered 2014-05-25: 1 via TOPICAL
  Filled 2014-05-25 (×2): qty 28

## 2014-05-25 MED ORDER — DIPHENHYDRAMINE HCL 25 MG PO CAPS
50.0000 mg | ORAL_CAPSULE | Freq: Three times a day (TID) | ORAL | Status: DC | PRN
  Administered 2014-05-25: 50 mg via ORAL
  Filled 2014-05-25: qty 2

## 2014-05-25 MED ORDER — FAMOTIDINE 20 MG PO TABS
20.0000 mg | ORAL_TABLET | Freq: Two times a day (BID) | ORAL | Status: DC
  Administered 2014-05-25 – 2014-05-26 (×3): 20 mg via ORAL
  Filled 2014-05-25 (×5): qty 1

## 2014-05-25 NOTE — Progress Notes (Signed)
Subjective: 1 Day Post-Op Procedure(s) (LRB): LEFT TOTAL KNEE ARTHROPLASTY (Left) Patient reports pain as 3 on 0-10 scale.    Objective: Vital signs in last 24 hours: Temp:  [97.4 F (36.3 C)-98.7 F (37.1 C)] 98.2 F (36.8 C) (05/06 0531) Pulse Rate:  [60-90] 76 (05/06 0531) Resp:  [10-18] 16 (05/06 0531) BP: (89-122)/(50-65) 105/56 mmHg (05/06 0531) SpO2:  [97 %-100 %] 98 % (05/06 0531)  Intake/Output from previous day: 05/05 0701 - 05/06 0700 In: 3675 [P.O.:720; I.V.:2705; IV Piggyback:250] Out: 2463 [Urine:2350; Drains:113] Intake/Output this shift: Total I/O In: 600 [I.V.:450; IV Piggyback:150] Out: 1755 [Urine:1750; Drains:5]   Recent Labs  05/25/14 0408  HGB 9.5*    Recent Labs  05/25/14 0408  WBC 8.8  RBC 3.10*  HCT 28.9*  PLT 167    Recent Labs  05/25/14 0408  NA 136  K 3.9  CL 103  CO2 26  BUN 11  CREATININE 0.72  GLUCOSE 121*  CALCIUM 8.5*   No results for input(s): LABPT, INR in the last 72 hours.  Neurologically intact ABD soft Neurovascular intact Intact pulses distally Incision: dressing C/D/I  hemovac d/c'd tip intact  Assessment/Plan: 1 Day Post-Op Procedure(s) (LRB): LEFT TOTAL KNEE ARTHROPLASTY (Left) Advance diet Up with therapy D/C IV fluids  Marik Sedore C 05/25/2014, 6:53 AM

## 2014-05-25 NOTE — Progress Notes (Signed)
Physical Therapy Treatment Patient Details Name: Bertis Ruddyonya M Mcloughlin MRN: [id number] DOB: Jun 04, 1955 Today's Date: 05/25/2014    History of Present Illness L TKR; recent R TKR    PT Comments    Steady progress with mobility.  Pt hopeful for dc tomorrow.  Follow Up Recommendations  Home health PT     Equipment Recommendations  None recommended by PT    Recommendations for Other Services OT consult     Precautions / Restrictions Precautions Precautions: Knee;Fall Required Braces or Orthoses: Knee Immobilizer - Left Knee Immobilizer - Left: Discontinue once straight leg raise with < 10 degree lag Restrictions Weight Bearing Restrictions: No LLE Weight Bearing: Weight bearing as tolerated    Mobility  Bed Mobility Overal bed mobility: Needs Assistance Bed Mobility: Supine to Sit;Sit to Supine     Supine to sit: Min assist Sit to supine: Min assist   General bed mobility comments: cues for sequence and use of R LE to self assist  Transfers Overall transfer level: Needs assistance Equipment used: Rolling walker (2 wheeled) Transfers: Sit to/from Stand Sit to Stand: Min assist         General transfer comment: cues for LE management and use of UEs to self assist  Ambulation/Gait Ambulation/Gait assistance: Min assist;Min guard Ambulation Distance (Feet): 72 Feet Assistive device: Rolling walker (2 wheeled) Gait Pattern/deviations: Step-to pattern;Decreased step length - right;Decreased step length - left;Shuffle;Trunk flexed Gait velocity: decr   General Gait Details: cues for sequence, posture and position from [employer]   Stairs            Wheelchair Mobility    Modified Rankin (Stroke Patients Only)       Balance                                    Cognition Arousal/Alertness: Awake/alert Behavior During Therapy: WFL for tasks assessed/performed Overall Cognitive Status: Within Functional Limits for tasks assessed                      Exercises      General Comments        Pertinent Vitals/Pain Pain Assessment: 0-10 Pain Score: 6  Pain Location: L knee Pain Descriptors / Indicators: Aching;Sore Pain Intervention(s): Limited activity within patient's tolerance;Monitored during session;Premedicated before session;Ice applied    Home Living                      Prior Function            PT Goals (current goals can now be found in the care plan section) Acute Rehab PT Goals Patient Stated Goal: Resume previous lifestyle with decreased pain PT Goal Formulation: With patient Time For Goal Achievement: 05/29/14 Potential to Achieve Goals: Good Progress towards PT goals: Progressing toward goals    Frequency  7X/week    PT Plan Current plan remains appropriate    Co-evaluation             End of Session Equipment Utilized During Treatment: Gait belt Activity Tolerance: Patient tolerated treatment well Patient left: in bed;with call bell/phone within reach;with family/visitor present     Time: [id number] PT Time Calculation (min) (ACUTE ONLY): 18 min  Charges:  $Gait Training: 8-22 mins                    G Codes:  Tequan Redmon 05/25/2014, 6:32 PM

## 2014-05-25 NOTE — Progress Notes (Signed)
CSW consulted for SNF placement. PN reviewed. Pt is planning to d/c home following hospitalization. RNCM is assisting with d/c planning. CSW is available to assist with rehab placement if plan changes and placement is needed.  Cori RazorJamie Ermal Haberer LCSW [phone]

## 2014-05-25 NOTE — Discharge Instructions (Signed)
Elevate leg above heart 6x a day for 20minutes each °Use knee immobilizer while walking until can SLR x 10 °Use knee immobilizer in bed to keep knee in extension °Aquacel dressing may remain in place until follow up. May shower with aquacel dressing in place. If the dressing becomes saturated or peels off, you may remove aquacel dressing. Do not remove steri-strips if they are present. Place new dressing with gauze and tape or ACE bandage which should be kept clean and dry and changed daily. ° °======================================================================================== ° °Information on my medicine - XARELTO® (Rivaroxaban) ° °This medication education was reviewed with me or my healthcare representative as part of my discharge preparation.  The pharmacist that spoke with me during my hospital stay was:  Zi Sek K, RPH ° °Why was Xarelto® prescribed for you? °Xarelto® was prescribed for you to reduce the risk of blood clots forming after orthopedic surgery. The medical term for these abnormal blood clots is venous thromboembolism (VTE). ° °What do you need to know about xarelto® ? °Take your Xarelto® ONCE DAILY at the same time every day. °You may take it either with or without food. ° °If you have difficulty swallowing the tablet whole, you may crush it and mix in applesauce just prior to taking your dose. ° °Take Xarelto® exactly as prescribed by your doctor and DO NOT stop taking Xarelto® without talking to the doctor who prescribed the medication.  Stopping without other VTE prevention medication to take the place of Xarelto® may increase your risk of developing a clot. ° °After discharge, you should have regular check-up appointments with your healthcare provider that is prescribing your Xarelto®.   ° °What do you do if you miss a dose? °If you miss a dose, take it as soon as you remember on the same day then continue your regularly scheduled once daily regimen the next day. Do not take two  doses of Xarelto® on the same day.  ° °Important Safety Information °A possible side effect of Xarelto® is bleeding. You should call your healthcare provider right away if you experience any of the following: °? Bleeding from an injury or your nose that does not stop. °? Unusual colored urine (red or dark brown) or unusual colored stools (red or black). °? Unusual bruising for unknown reasons. °? A serious fall or if you hit your head (even if there is no bleeding). ° °Some medicines may interact with Xarelto® and might increase your risk of bleeding while on Xarelto®. To help avoid this, consult your healthcare provider or pharmacist prior to using any new prescription or non-prescription medications, including herbals, vitamins, non-steroidal anti-inflammatory drugs (NSAIDs) and supplements. ° °This website has more information on Xarelto®: www.xarelto.com. ° ° °

## 2014-05-25 NOTE — Progress Notes (Signed)
Physical Therapy Evaluation Patient Details Name: [name] MRN: [id number] DOB: April 17, 1955 Today's Date: 05/25/2014   History of Present Illness  L TKR; recent R TKR  Clinical Impression  Pt s/p L TKR presents with decreased L LE strength/ROM and post op pain limiting functional mobility.  Pt should progress to dc home with family assist and HHPT follow up.    Follow Up Recommendations Home health PT    Equipment Recommendations  None recommended by PT    Recommendations for Other Services OT consult     Precautions / Restrictions Precautions Precautions: Knee;Fall Required Braces or Orthoses: Knee Immobilizer - Left Knee Immobilizer - Left: Discontinue once straight leg raise with < 10 degree lag Restrictions Weight Bearing Restrictions: No LLE Weight Bearing: Weight bearing as tolerated      Mobility  Bed Mobility Overal bed mobility: Needs Assistance Bed Mobility: Supine to Sit     Supine to sit: Min assist     General bed mobility comments: cues for sequence and use of R LE to self assist  Transfers Overall transfer level: Needs assistance Equipment used: Rolling walker (2 wheeled) Transfers: Sit to/from Stand Sit to Stand: Min assist         General transfer comment: cues for LE management and use of UEs to self assist  Ambulation/Gait Ambulation/Gait assistance: Min assist Ambulation Distance (Feet): 38 Feet Assistive device: Rolling walker (2 wheeled) Gait Pattern/deviations: Step-to pattern;Decreased step length - right;Decreased step length - left;Shuffle;Trunk flexed Gait velocity: decr   General Gait Details: cues for sequence, posture and position from [employer]  Stairs            Wheelchair Mobility    Modified Rankin (Stroke Patients Only)       Balance                                             Pertinent Vitals/Pain Pain Assessment: 0-10 Pain Score: 6  Pain Location: L knee Pain Descriptors / Indicators:  Aching;Burning Pain Intervention(s): Limited activity within patient's tolerance;Monitored during session;Ice applied;Premedicated before session    Home Living Family/patient expects to be discharged to:: Private residence Living Arrangements: Children Available Help at Discharge: Family Type of Home: House Home Access: Stairs to enter Entrance Stairs-Rails: Can reach both Entrance Stairs-Number of Steps: 2 Home Layout: One level Home Equipment: Toilet riser;Grab bars - toilet;Cane - single point;Walker - 2 wheels;Shower seat Additional Comments: daughters got elevated toilet seat with riser; and tub bench    Prior Function Level of Independence: Independent               Hand Dominance   Dominant Hand: Right    Extremity/Trunk Assessment   Upper Extremity Assessment: Overall WFL for tasks assessed           Lower Extremity Assessment: RLE deficits/detail;LLE deficits/detail RLE Deficits / Details: Strength WFL with AAROM at knee -8 - 90 LLE Deficits / Details: 2/5 quads with AAROM at knee -10-  40     Communication   Communication: No difficulties  Cognition Arousal/Alertness: Awake/alert Behavior During Therapy: WFL for tasks assessed/performed Overall Cognitive Status: Within Functional Limits for tasks assessed                      General Comments      Exercises Total Joint Exercises Ankle Circles/Pumps: AROM;Both;15 reps;Supine  Quad Sets: AROM;Both;10 reps;Supine Heel Slides: AAROM;Supine;10 reps;Both Straight Leg Raises: AAROM;Left;10 reps;Supine      Assessment/Plan    PT Assessment Patient needs continued PT services  PT Diagnosis Difficulty walking   PT Problem List Decreased strength;Decreased range of motion;Decreased activity tolerance;Decreased mobility;Decreased knowledge of use of DME;Pain  PT Treatment Interventions DME instruction;Gait training;Stair training;Functional mobility training;Therapeutic activities;Therapeutic  exercise;Patient/family education   PT Goals (Current goals can be found in the Care Plan section) Acute Rehab PT Goals Patient Stated Goal: Resume previous lifestyle with decreased pain PT Goal Formulation: With patient Time For Goal Achievement: 05/29/14 Potential to Achieve Goals: Good    Frequency 7X/week   Barriers to discharge        Co-evaluation               End of Session Equipment Utilized During Treatment: Gait belt Activity Tolerance: Patient tolerated treatment well Patient left: in chair;with call bell/phone within reach;with family/visitor present Nurse Communication: Mobility status         Time: [id number] PT Time Calculation (min) (ACUTE ONLY): 38 min   Charges:   PT Evaluation $Initial PT Evaluation Tier I: 1 Procedure PT Treatments $Gait Training: 8-22 mins $Therapeutic Exercise: 8-22 mins   PT G Codes:        Teara Duerksen 05/25/2014, 1:28 PM

## 2014-05-25 NOTE — Progress Notes (Signed)
   Subjective: 1 Day Post-Op Procedure(s) (LRB): LEFT TOTAL KNEE ARTHROPLASTY (Left)  Recheck bilateral legs for increasing redness and warmth Mild itching when she first noticed the redness Mainly redness to anterior and posterior right leg and posterior left leg Report of low grade temp but pt feels fine currently Patient reports pain as mild.  Objective:   VITALS:   Filed Vitals:   05/25/14 1345  BP: 125/69  Pulse: 84  Temp: 100 F (37.8 C)  Resp: 18    Mild erythema to both anterior and posterior right leg Minimal erythema to left posterior leg nv intact distally No signs of acute infection, sepsis or cellulitis Suspect more of an allergic reaction to medication vs contact  LABS  Recent Labs  05/25/14 0408  HGB 9.5*  HCT 28.9*  WBC 8.8  PLT 167     Recent Labs  05/25/14 0408  NA 136  K 3.9  BUN 11  CREATININE 0.72  GLUCOSE 121*     Assessment/Plan: 1 Day Post-Op Procedure(s) (LRB): LEFT TOTAL KNEE ARTHROPLASTY (Left) Overall pt doing fairly well Recommend treating redness with H2 blockers and watch her progress Patient in agreement Informed Dr. Shelle IronBeane of her condition     Alphonsa OverallBrad Graycee Greeson, MPAS, PA-C  05/25/2014, 5:31 PM

## 2014-05-25 NOTE — Progress Notes (Signed)
Left message for Dr. Shelle IronBeane regarding patient's redness/warmth to bilateral legs.

## 2014-05-25 NOTE — Progress Notes (Signed)
OT Cancellation Note  Patient Details Name: [name] MRN: [id number] DOB: 01-Nov-1955   Cancelled Treatment:    Reason Eval/Treat Not Completed: OT screened, no needs identified, will sign off   Pt had other knee done in February: no needs  Estefano Victory 05/25/2014, 9:43 AM  Marica OtterMaryellen Briellah Baik, OTR/L [phone] 05/25/2014

## 2014-05-25 NOTE — Progress Notes (Signed)
Called [facility] and left message with answering service for Dr. Shelle IronBeane to notify of redness/swelling/warmth to R leg and L foot.

## 2014-05-26 LAB — CBC
HCT: 29.6 % — ABNORMAL LOW (ref 36.0–46.0)
Hemoglobin: 9.7 g/dL — ABNORMAL LOW (ref 12.0–15.0)
MCH: 30.3 pg (ref 26.0–34.0)
MCHC: 32.8 g/dL (ref 30.0–36.0)
MCV: 92.5 fL (ref 78.0–100.0)
PLATELETS: 154 10*3/uL (ref 150–400)
RBC: 3.2 MIL/uL — AB (ref 3.87–5.11)
RDW: 13.7 % (ref 11.5–15.5)
WBC: 9.6 10*3/uL (ref 4.0–10.5)

## 2014-05-26 NOTE — Progress Notes (Signed)
Physical Therapy Treatment Patient Details Name: [name] MRN: [id number] DOB: October 23, 1955 Today's Date: 05/26/2014    History of Present Illness L TKR; recent R TKR    PT Comments    Steady progress and capable assist at home - pt eager for dc  Follow Up Recommendations  Home health PT     Equipment Recommendations  None recommended by PT    Recommendations for Other Services OT consult     Precautions / Restrictions Precautions Precautions: Knee;Fall Required Braces or Orthoses: Knee Immobilizer - Left Knee Immobilizer - Left: Discontinue once straight leg raise with < 10 degree lag Restrictions Weight Bearing Restrictions: Yes LLE Weight Bearing: Weight bearing as tolerated    Mobility  Bed Mobility Overal bed mobility: Needs Assistance Bed Mobility: Supine to Sit;Sit to Supine     Supine to sit: Min assist Sit to supine: Min assist   General bed mobility comments: cues for sequence and use of R LE to self assist  Transfers Overall transfer level: Needs assistance Equipment used: Rolling walker (2 wheeled) Transfers: Sit to/from Stand Sit to Stand: Supervision         General transfer comment: min cues for LE management and use of UEs to self assist  Ambulation/Gait Ambulation/Gait assistance: Min guard;Supervision Ambulation Distance (Feet): 40 Feet Assistive device: Rolling walker (2 wheeled) Gait Pattern/deviations: Step-to pattern;Decreased step length - right;Decreased step length - left;Shuffle;Trunk flexed Gait velocity: decr   General Gait Details: min cues for sequence, posture and position from RW   Stairs Stairs: Yes Stairs assistance: Min assist Stair Management: Two rails;Step to pattern;Forwards Number of Stairs: 2 General stair comments: min cues for sequence  Wheelchair Mobility    Modified Rankin (Stroke Patients Only)       Balance                                    Cognition Arousal/Alertness:  Awake/alert Behavior During Therapy: WFL for tasks assessed/performed Overall Cognitive Status: Within Functional Limits for tasks assessed                      Exercises Total Joint Exercises Ankle Circles/Pumps: AROM;Both;15 reps;Supine Quad Sets: AROM;Both;Supine;15 reps Heel Slides: AAROM;Supine;Both;15 reps Straight Leg Raises: AAROM;Left;Supine;15 reps Goniometric ROM: AAROM at L knee -10 - 50    General Comments        Pertinent Vitals/Pain Pain Assessment: 0-10 Pain Score: 6  Pain Location: L knee Pain Descriptors / Indicators: Aching;Sore Pain Intervention(s): Limited activity within patient's tolerance;Monitored during session;Premedicated before session;Ice applied    Home Living                      Prior Function            PT Goals (current goals can now be found in the care plan section) Acute Rehab PT Goals Patient Stated Goal: Resume previous lifestyle with decreased pain PT Goal Formulation: With patient Time For Goal Achievement: 05/29/14 Potential to Achieve Goals: Good Progress towards PT goals: Progressing toward goals    Frequency  7X/week    PT Plan Current plan remains appropriate    Co-evaluation             End of Session Equipment Utilized During Treatment: Gait belt;Left knee immobilizer Activity Tolerance: Patient tolerated treatment well Patient left: in bed;with call bell/phone within reach;with family/visitor present  Time: [id number] PT Time Calculation (min) (ACUTE ONLY): 23 min  Charges:  $Gait Training: 8-22 mins $Therapeutic Exercise: 8-22 mins                    G Codes:      Hatice Bubel 05/26/2014, 12:54 PM

## 2014-05-26 NOTE — Progress Notes (Signed)
Subjective: 2 Days Post-Op Procedure(s) (LRB): LEFT TOTAL KNEE ARTHROPLASTY (Left) Patient reports pain as moderate to right knee. Tolerating PO's well. Progressing with PT. Denies CP, SOB, or Calf, pain.  Objective: Vital signs in last 24 hours: Temp:  [98.9 F (37.2 C)-100 F (37.8 C)] 99.6 F (37.6 C) (05/07 0526) Pulse Rate:  [72-84] 78 (05/07 0526) Resp:  [16-18] 18 (05/07 0526) BP: (101-125)/(54-69) 124/67 mmHg (05/07 0526) SpO2:  [99 %-100 %] 99 % (05/07 0526)  Intake/Output from previous day: 05/06 0701 - 05/07 0700 In: 960 [P.O.:960] Out: 400 [Urine:400] Intake/Output this shift:     Recent Labs  05/25/14 0408 05/26/14 0403  HGB 9.5* 9.7*    Recent Labs  05/25/14 0408 05/26/14 0403  WBC 8.8 9.6  RBC 3.10* 3.20*  HCT 28.9* 29.6*  PLT 167 154    Recent Labs  05/25/14 0408  NA 136  K 3.9  CL 103  CO2 26  BUN 11  CREATININE 0.72  GLUCOSE 121*  CALCIUM 8.5*   No results for input(s): LABPT, INR in the last 72 hours.  Well nourished. Alert and oriented x3. RRR, Lungs clear, BS x4. Abdomen soft and non tender. Right Calf soft and non tender. Right knee dressing C/D/I. No DVT signs. Compartment soft. No signs of infection.  Right LE grossly neurovascular intact.  Rash has improved.  Assessment/Plan: 2 Days Post-Op Procedure(s) (LRB): LEFT TOTAL KNEE ARTHROPLASTY (Left) D/c home with Family Home PT PT this am F/U in office with DR. Beane RX done and follow instructions  Redness has improved from yesterday.   Dianne Whelchel L 05/26/2014, 8:06 AM

## 2014-05-26 NOTE — Progress Notes (Signed)
Completed D/C teaching with patient and family. The [facility] did not have a prescription for Xarelto. Bryson, the PA, was notified and he sent a prescription to [facility] [address] for pick up. All questions were answered and the pt and family understand that Xarelto needs to be used as DVT prophylaxis. Pt will be D/C home with family in stable condition.

## 2014-05-28 NOTE — Discharge Summary (Signed)
Physician Discharge Summary   Patient ID: [name] MRN: [id number] DOB/AGE: [birth year] 57 y.o.  Admit date: 05/24/2014 Discharge date: 05/26/2014  Primary Diagnosis: left knee DJD  Admission Diagnoses:  Past Medical History  Diagnosis Date  . Unspecified hypothyroidism   . Obesity   . Hemorrhoids, internal 03/2002  . Colon polyp 6/09    Dr. Collene Mares (no path report in chart)  . Hypertension   . Vitamin D deficiency 11/2010  . Normal cardiac stress test 08/19/04    normal 2D echo and cardiolite perfusion study; Dr. West Bali  . Sleep disturbance 06/04/2004    normal sleep study  . PONV (postoperative nausea and vomiting)   . Arthritis   . Depression    Discharge Diagnoses:   Principal Problem:   Primary osteoarthritis of left knee Active Problems:   Left knee DJD  Estimated body mass index is 30.68 kg/(m^2) as calculated from the following:   Height as of this encounter: 5' 6"  (1.676 m).   Weight as of this encounter: 86.183 kg (190 lb).  Procedure:  Procedure(s) (LRB): LEFT TOTAL KNEE ARTHROPLASTY (Left)   Consults: None  HPI: see H&P Laboratory Data: Admission on 05/24/2014, Discharged on 05/26/2014  Component Date Value Ref Range Status  . WBC 05/25/2014 8.8  4.0 - 10.5 K/uL Final  . RBC 05/25/2014 3.10* 3.87 - 5.11 MIL/uL Final  . Hemoglobin 05/25/2014 9.5* 12.0 - 15.0 g/dL Final  . HCT 05/25/2014 28.9* 36.0 - 46.0 % Final  . MCV 05/25/2014 93.2  78.0 - 100.0 fL Final  . MCH 05/25/2014 30.6  26.0 - 34.0 pg Final  . MCHC 05/25/2014 32.9  30.0 - 36.0 g/dL Final  . RDW 05/25/2014 13.9  11.5 - 15.5 % Final  . Platelets 05/25/2014 167  150 - 400 K/uL Final  . Sodium 05/25/2014 136  135 - 145 mmol/L Final  . Potassium 05/25/2014 3.9  3.5 - 5.1 mmol/L Final  . Chloride 05/25/2014 103  101 - 111 mmol/L Final  . CO2 05/25/2014 26  22 - 32 mmol/L Final  . Glucose, Bld 05/25/2014 121* 70 - 99 mg/dL Final  . BUN 05/25/2014 11  6 - 20 mg/dL Final  . Creatinine, Ser  05/25/2014 0.72  0.44 - 1.00 mg/dL Final  . Calcium 05/25/2014 8.5* 8.9 - 10.3 mg/dL Final  . GFR calc non Af Amer 05/25/2014 >60  >60 mL/min Final  . GFR calc Af Amer 05/25/2014 >60  >60 mL/min Final   Comment: (NOTE) The eGFR has been calculated using the CKD EPI equation. This calculation has not been validated in all clinical situations. eGFR's persistently <60 mL/min signify possible Chronic Kidney Disease.   . Anion gap 05/25/2014 7  5 - 15 Final  . WBC 05/26/2014 9.6  4.0 - 10.5 K/uL Final  . RBC 05/26/2014 3.20* 3.87 - 5.11 MIL/uL Final  . Hemoglobin 05/26/2014 9.7* 12.0 - 15.0 g/dL Final  . HCT 05/26/2014 29.6* 36.0 - 46.0 % Final  . MCV 05/26/2014 92.5  78.0 - 100.0 fL Final  . MCH 05/26/2014 30.3  26.0 - 34.0 pg Final  . MCHC 05/26/2014 32.8  30.0 - 36.0 g/dL Final  . RDW 05/26/2014 13.7  11.5 - 15.5 % Final  . Platelets 05/26/2014 154  150 - 400 K/uL Final  Hospital Outpatient Visit on 05/16/2014  Component Date Value Ref Range Status  . MRSA, PCR 05/16/2014 NEGATIVE  NEGATIVE Final  . Staphylococcus aureus 05/16/2014 NEGATIVE  NEGATIVE Final   Comment:  The Xpert SA Assay (FDA approved for NASAL specimens in patients over 44 years of age), is one component of a comprehensive surveillance program.  Test performance has been validated by [facility] for patients greater than or equal to 54 year old. It is not intended to diagnose infection nor to guide or monitor treatment.   Marland Kitchen aPTT 05/16/2014 37  24 - 37 seconds Final   Comment:        IF BASELINE aPTT IS ELEVATED, SUGGEST PATIENT RISK ASSESSMENT BE USED TO DETERMINE APPROPRIATE ANTICOAGULANT THERAPY.   . WBC 05/16/2014 6.2  4.0 - 10.5 K/uL Final  . RBC 05/16/2014 4.06  3.87 - 5.11 MIL/uL Final  . Hemoglobin 05/16/2014 12.5  12.0 - 15.0 g/dL Final  . HCT 05/16/2014 38.1  36.0 - 46.0 % Final  . MCV 05/16/2014 93.8  78.0 - 100.0 fL Final  . MCH 05/16/2014 30.8  26.0 - 34.0 pg Final  . MCHC 05/16/2014  32.8  30.0 - 36.0 g/dL Final  . RDW 05/16/2014 13.9  11.5 - 15.5 % Final  . Platelets 05/16/2014 221  150 - 400 K/uL Final  . Prothrombin Time 05/16/2014 13.6  11.6 - 15.2 seconds Final  . INR 05/16/2014 1.03  0.00 - 1.49 Final  . Color, Urine 05/16/2014 YELLOW  YELLOW Final  . APPearance 05/16/2014 CLEAR  CLEAR Final  . Specific Gravity, Urine 05/16/2014 1.011  1.005 - 1.030 Final  . pH 05/16/2014 6.5  5.0 - 8.0 Final  . Glucose, UA 05/16/2014 NEGATIVE  NEGATIVE mg/dL Final  . Hgb urine dipstick 05/16/2014 NEGATIVE  NEGATIVE Final  . Bilirubin Urine 05/16/2014 NEGATIVE  NEGATIVE Final  . Ketones, ur 05/16/2014 NEGATIVE  NEGATIVE mg/dL Final  . Protein, ur 05/16/2014 NEGATIVE  NEGATIVE mg/dL Final  . Urobilinogen, UA 05/16/2014 0.2  0.0 - 1.0 mg/dL Final  . Nitrite 05/16/2014 NEGATIVE  NEGATIVE Final  . Leukocytes, UA 05/16/2014 NEGATIVE  NEGATIVE Final   MICROSCOPIC NOT DONE ON URINES WITH NEGATIVE PROTEIN, BLOOD, LEUKOCYTES, NITRITE, OR GLUCOSE <1000 mg/dL.  Marland Kitchen Sodium 05/16/2014 137  135 - 145 mmol/L Final  . Potassium 05/16/2014 5.1  3.5 - 5.1 mmol/L Final  . Chloride 05/16/2014 101  96 - 112 mmol/L Final  . CO2 05/16/2014 27  19 - 32 mmol/L Final  . Glucose, Bld 05/16/2014 110* 70 - 99 mg/dL Final  . BUN 05/16/2014 15  6 - 23 mg/dL Final  . Creatinine, Ser 05/16/2014 0.84  0.50 - 1.10 mg/dL Final  . Calcium 05/16/2014 10.0  8.4 - 10.5 mg/dL Final  . GFR calc non Af Amer 05/16/2014 76* >90 mL/min Final  . GFR calc Af Amer 05/16/2014 88* >90 mL/min Final   Comment: (NOTE) The eGFR has been calculated using the CKD EPI equation. This calculation has not been validated in all clinical situations. eGFR's persistently <90 mL/min signify possible Chronic Kidney Disease.   . Anion gap 05/16/2014 9  5 - 15 Final  . ABO/RH(D) 05/16/2014 O NEG   Final  . Antibody Screen 05/16/2014 NEG   Final  . Sample Expiration 05/16/2014 05/27/2014   Final     X-Rays:Dg Knee 1-2 Views  Left  05/16/2014   CLINICAL DATA:  Left knee surgery planned for May 24, 2014  EXAM: LEFT KNEE - 1-2 VIEW  COMPARISON:  Left knee series of March 21, 2004  FINDINGS: The bones of the knee are adequately mineralized. There is mild narrowing of the medial joint compartment. There is beaking of  the tibial spines. There is a small spur from the periphery of the medial tibial plateau. There are moderate-sized spurs arising from the superior and inferior articular margins of the patella. There is no joint effusion.  IMPRESSION: There is mild narrowing of the medial joint compartment and spurring involving the patellofemoral compartment consistent with osteoarthritis.   Electronically Signed   By: David  Martinique M.D.   On: 05/16/2014 12:16   Dg Knee Left Port  05/24/2014   CLINICAL DATA:  59 year old female status post left knee arthroplasty. Initial encounter.  EXAM: PORTABLE LEFT KNEE - 1-2 VIEW  COMPARISON:  Preoperative study 05/16/2014.  FINDINGS: Portable AP and cross-table lateral views at 1032 hrs. Sequelae of left total knee arthroplasty. Hardware appears intact and normally aligned. Postoperative drain in place. Postoperative changes to the surrounding soft tissues.  IMPRESSION: Left total knee arthroplasty with no adverse features.   Electronically Signed   By: Genevie Ann M.D.   On: 05/24/2014 10:59    EKG: Orders placed or performed in visit on 02/28/14  . EKG 12-Lead     Hospital Course: [name] is a 59 y.o. who was admitted to [facility]. They were brought to the operating room on 05/24/2014 and underwent Procedure(s): LEFT TOTAL KNEE ARTHROPLASTY.  Patient tolerated the procedure well and was later transferred to the recovery room and then to the orthopaedic floor for postoperative care.  They were given PO and IV analgesics for pain control following their surgery.  They were given 24 hours of postoperative antibiotics of  Anti-infectives    Start     Dose/Rate Route Frequency Ordered  Stop   05/24/14 1400  ceFAZolin (ANCEF) IVPB 2 g/50 mL premix     2 g 100 mL/hr over 30 Minutes Intravenous Every 6 hours 05/24/14 1206 05/25/14 0147   05/24/14 1400  clindamycin (CLEOCIN) IVPB 900 mg     900 mg 100 mL/hr over 30 Minutes Intravenous  Once 05/24/14 1206 05/24/14 1415   05/24/14 0812  polymyxin B 500,000 Units, bacitracin 50,000 Units in sodium chloride irrigation 0.9 % 500 mL irrigation  Status:  Discontinued       As needed 05/24/14 0812 05/24/14 1006   05/24/14 0600  ceFAZolin (ANCEF) IVPB 2 g/50 mL premix     2 g 100 mL/hr over 30 Minutes Intravenous On call to O.R. 05/24/14 0550 05/24/14 0738   05/24/14 0600  clindamycin (CLEOCIN) IVPB 900 mg     900 mg 100 mL/hr over 30 Minutes Intravenous On call to O.R. 05/24/14 4166 05/24/14 0728     and started on DVT prophylaxis in the form of Xarelto, TED hose and SCDs.   PT and OT were ordered for total joint protocol.  Discharge planning consulted to help with postop disposition and equipment needs.  Patient had a fair night on the evening of surgery.  They started to get up OOB with therapy on day one. Hemovac drain was pulled without difficulty.  Continued to work with therapy into day two.  By day two, the patient had progressed with therapy and meeting their goals.  Incision was healing well.  Patient was seen in rounds and was ready to go home.   Diet: Regular diet Activity:WBAT Follow-up:in 10-14 days Disposition - Home Discharged Condition: good      Medication List    STOP taking these medications        aspirin 81 MG tablet      TAKE these medications  acetaminophen 325 MG tablet  Commonly known as:  TYLENOL  Take 650 mg by mouth every 6 (six) hours as needed (For pain.).     buPROPion 300 MG 24 hr tablet  Commonly known as:  WELLBUTRIN XL  Take 1 tablet (300 mg total) by mouth every morning.     desonide 0.05 % cream  Commonly known as:  DESOWEN  Apply topically 2 (two) times daily.      docusate sodium 100 MG capsule  Commonly known as:  COLACE  Take 1 capsule (100 mg total) by mouth 2 (two) times daily as needed for mild constipation.     ergocalciferol 50000 UNITS capsule  Commonly known as:  VITAMIN D2  Take 1 capsule (50,000 Units total) by mouth once a week.     lisinopril-hydrochlorothiazide 10-12.5 MG per tablet  Commonly known as:  PRINZIDE,ZESTORETIC  Take 1 tablet by mouth daily.     methocarbamol 500 MG tablet  Commonly known as:  ROBAXIN  Take 1 tablet (500 mg total) by mouth 3 (three) times daily.     ondansetron 8 MG tablet  Commonly known as:  ZOFRAN  Take 1 tablet (8 mg total) by mouth every 8 (eight) hours as needed for nausea or vomiting.     oxyCODONE-acetaminophen 7.5-325 MG per tablet  Commonly known as:  PERCOCET  Take 1-2 tablets by mouth every 4 (four) hours as needed.     rivaroxaban 10 MG Tabs tablet  Commonly known as:  XARELTO  Take 1 tablet (10 mg total) by mouth daily.     SYNTHROID 75 MCG tablet  Generic drug:  levothyroxine  TAKE 1 TABLET BY MOUTH EVERY DAY           Follow-up Information    Follow up with BEANE,JEFFREY C, MD In 2 weeks.   Specialty:  Orthopedic Surgery   Why:  For suture removal   Contact information:   [address] [place] [address] [phone]       Follow up with [facility].   Why:  home health physical therapy   Contact information:   [address] [place] [address] [address] [phone]       Signed: Lacie Draft, PA-C Orthopaedic Surgery 05/28/2014, 6:22 PM

## 2014-09-03 ENCOUNTER — Ambulatory Visit (INDEPENDENT_AMBULATORY_CARE_PROVIDER_SITE_OTHER): Payer: BLUE CROSS/BLUE SHIELD | Admitting: Family Medicine

## 2014-09-03 ENCOUNTER — Encounter: Payer: Self-pay | Admitting: Family Medicine

## 2014-09-03 VITALS — BP 118/74 | HR 68 | Ht 66.0 in | Wt 186.6 lb

## 2014-09-03 DIAGNOSIS — F324 Major depressive disorder, single episode, in partial remission: Secondary | ICD-10-CM

## 2014-09-03 DIAGNOSIS — N898 Other specified noninflammatory disorders of vagina: Secondary | ICD-10-CM

## 2014-09-03 DIAGNOSIS — Z1159 Encounter for screening for other viral diseases: Secondary | ICD-10-CM

## 2014-09-03 DIAGNOSIS — L298 Other pruritus: Secondary | ICD-10-CM

## 2014-09-03 DIAGNOSIS — Z23 Encounter for immunization: Secondary | ICD-10-CM

## 2014-09-03 DIAGNOSIS — F325 Major depressive disorder, single episode, in full remission: Secondary | ICD-10-CM

## 2014-09-03 DIAGNOSIS — D649 Anemia, unspecified: Secondary | ICD-10-CM

## 2014-09-03 DIAGNOSIS — E559 Vitamin D deficiency, unspecified: Secondary | ICD-10-CM | POA: Diagnosis not present

## 2014-09-03 DIAGNOSIS — I10 Essential (primary) hypertension: Secondary | ICD-10-CM | POA: Diagnosis not present

## 2014-09-03 DIAGNOSIS — E039 Hypothyroidism, unspecified: Secondary | ICD-10-CM

## 2014-09-03 DIAGNOSIS — Z0000 Encounter for general adult medical examination without abnormal findings: Secondary | ICD-10-CM

## 2014-09-03 LAB — POCT URINALYSIS DIPSTICK
Bilirubin, UA: NEGATIVE
Glucose, UA: NEGATIVE
KETONES UA: NEGATIVE
LEUKOCYTES UA: NEGATIVE
Nitrite, UA: NEGATIVE
PROTEIN UA: NEGATIVE
RBC UA: NEGATIVE
Spec Grav, UA: 1.02
Urobilinogen, UA: NEGATIVE
pH, UA: 6

## 2014-09-03 LAB — COMPREHENSIVE METABOLIC PANEL
ALBUMIN: 4.3 g/dL (ref 3.6–5.1)
ALK PHOS: 94 U/L (ref 33–130)
ALT: 10 U/L (ref 6–29)
AST: 10 U/L (ref 10–35)
BILIRUBIN TOTAL: 0.4 mg/dL (ref 0.2–1.2)
BUN: 13 mg/dL (ref 7–25)
CALCIUM: 9.6 mg/dL (ref 8.6–10.4)
CO2: 26 mmol/L (ref 20–31)
Chloride: 102 mmol/L (ref 98–110)
Creat: 0.79 mg/dL (ref 0.50–1.05)
GLUCOSE: 77 mg/dL (ref 65–99)
POTASSIUM: 4.1 mmol/L (ref 3.5–5.3)
Sodium: 138 mmol/L (ref 135–146)
Total Protein: 7.1 g/dL (ref 6.1–8.1)

## 2014-09-03 LAB — CBC WITH DIFFERENTIAL/PLATELET
Basophils Absolute: 0.1 10*3/uL (ref 0.0–0.1)
Basophils Relative: 2 % — ABNORMAL HIGH (ref 0–1)
Eosinophils Absolute: 0.2 10*3/uL (ref 0.0–0.7)
Eosinophils Relative: 3 % (ref 0–5)
HEMATOCRIT: 36.3 % (ref 36.0–46.0)
HEMOGLOBIN: 12.1 g/dL (ref 12.0–15.0)
LYMPHS ABS: 1.9 10*3/uL (ref 0.7–4.0)
LYMPHS PCT: 27 % (ref 12–46)
MCH: 30.3 pg (ref 26.0–34.0)
MCHC: 33.3 g/dL (ref 30.0–36.0)
MCV: 91 fL (ref 78.0–100.0)
MONO ABS: 0.6 10*3/uL (ref 0.1–1.0)
MPV: 10.8 fL (ref 8.6–12.4)
Monocytes Relative: 9 % (ref 3–12)
Neutro Abs: 4.2 10*3/uL (ref 1.7–7.7)
Neutrophils Relative %: 59 % (ref 43–77)
Platelets: 217 10*3/uL (ref 150–400)
RBC: 3.99 MIL/uL (ref 3.87–5.11)
RDW: 14.3 % (ref 11.5–15.5)
WBC: 7.2 10*3/uL (ref 4.0–10.5)

## 2014-09-03 LAB — TSH: TSH: 1.401 u[IU]/mL (ref 0.350–4.500)

## 2014-09-03 MED ORDER — BUPROPION HCL ER (XL) 150 MG PO TB24: 150.0000 mg | ORAL_TABLET | Freq: Every day | ORAL | Status: DC

## 2014-09-03 NOTE — Progress Notes (Signed)
Chief Complaint  Patient presents with  . Annual Exam    fasting annual exam/med check with pelvic. No concerns.    [name] is a 59 y.o. female who presents for a complete physical and med check.  She had bilateral knee replacements (R in February, L in May), still on leave from work and getting PT.  Pain is much improved, but still has some discomfort on the left.  Requires pain medication at night after PT.  Hypothyroidism follow-up: She has been taking her thyroid medication along with all of her other medications, as she always has. She takes it an hour before food. She has not missed any doses. She denies any significant changes in hair/skin/energy/bowels/moods. +weight loss (intentional, more active since knee surgeries, getting PT).  Hypertension follow-up: Blood pressures are not checked elsewhere. Denies dizziness, headaches, chest pain, muscle cramps. Denies side effects of medications.   Depression: She was put on medication since her divorce. She has never tried to come off. She currently is out of work for the next couple of months (getting rehab).  Moods are good.  Vitamin D deficiency: She has consistently taking her Calcium and Vitamin D since she completed prescription course (started 01/2014).  Immunization History  Administered Date(s) Administered  . Influenza Split 10/12/2011  . Influenza,inj,Quad PF,36+ Mos 01/09/2013, 02/07/2014  . Tdap 09/19/2008   Last Pap smear: last GYN exam 04/2012; pap n/a due to hysterectomy Last mammogram: 04/2012 Last colonoscopy: 12/2007 Last DEXA: never Dentist: once yearly, past due Ophtho: once yearly, wears contacts Exercise: physical therapy twice weekly. Lab Results  Component Value Date   CHOL 187 02/07/2014   HDL 67 02/07/2014   LDLCALC 92 02/07/2014   TRIG 142 02/07/2014   CHOLHDL 2.8 02/07/2014    Past Medical History  Diagnosis Date  . Unspecified hypothyroidism   . Obesity   . Hemorrhoids, internal  03/2002  . Colon polyp 6/09    Dr. Collene Mares (no path report in chart)  . Hypertension   . Vitamin D deficiency 11/2010  . Normal cardiac stress test 08/19/04    normal 2D echo and cardiolite perfusion study; Dr. West Bali  . Sleep disturbance 06/04/2004    normal sleep study  . PONV (postoperative nausea and vomiting)   . Arthritis   . Depression     Past Surgical History  Procedure Laterality Date  . Cesarean section  04/08/81-02/02/90    X4  . Arthroscopic knee  08/18/10 Dr.Beane    right knee   . Arthroscopic knee  10/2010    left; Dr. Tonita Cong  . Abdominal hysterectomy  11/1996    1 ovary remains; due to dysmenorrhea  . Total knee arthroplasty Right 03/08/2014    Procedure: RIGHT TOTAL KNEE ARTHROPLASTY;  Surgeon: Johnn Hai, MD;  Location: [facility];  Service: Orthopedics;  Laterality: Right;  . Total knee arthroplasty Left 05/24/2014    Procedure: LEFT TOTAL KNEE ARTHROPLASTY;  Surgeon: Susa Day, MD;  Location: [facility];  Service: Orthopedics;  Laterality: Left;    Social History   Social History  . Marital Status: Single    Spouse Name: N/A  . Number of Children: 4  . Years of Education: N/A   Occupational History  . [employer], Customer service and sales rep    Social History Main Topics  . Smoking status: Former Smoker    Quit date: 01/20/1996  . Smokeless tobacco: Never Used  . Alcohol Use: Yes     Comment: 1  drink twice a year.  . Drug Use: No  . Sexual Activity: Not Currently   Other Topics Concern  . Not on file   Social History Narrative   Lives with 1 youngest children; divorced; exercise - yardwork, mowing.  Children live in [place], [place] and [place]    Family History  Problem Relation Age of Onset  . Arthritis Father   . Hypertension Father   . Cancer Father     lung cancer  . Hypertension Mother   . Diabetes Mother     borderline  . Heart disease Neg Hx   . Stroke Neg Hx   . Hypothyroidism Daughter     Outpatient Encounter  Prescriptions as of 09/03/2014  Medication Sig Note  . aspirin 81 MG tablet Take 81 mg by mouth daily.   Marland Kitchen buPROPion (WELLBUTRIN XL) 300 MG 24 hr tablet Take 1 tablet (300 mg total) by mouth every morning.   . cholecalciferol (VITAMIN D) 1000 UNITS tablet Take 1,000 Units by mouth daily.   Marland Kitchen docusate sodium (COLACE) 100 MG capsule Take 1 capsule (100 mg total) by mouth 2 (two) times daily as needed for mild constipation. 09/03/2014: Taking once daily  . lisinopril-hydrochlorothiazide (PRINZIDE,ZESTORETIC) 10-12.5 MG per tablet Take 1 tablet by mouth daily.   . methocarbamol (ROBAXIN) 500 MG tablet Take 1 tablet (500 mg total) by mouth 3 (three) times daily. 09/03/2014: Takes once daily  . oxyCODONE-acetaminophen (PERCOCET) 7.5-325 MG per tablet Take 1-2 tablets by mouth every 4 (four) hours as needed. 09/03/2014: Take at nighttime on the days she has PT (about 2x/week)  . SYNTHROID 75 MCG tablet TAKE 1 TABLET BY MOUTH EVERY DAY   . [DISCONTINUED] rivaroxaban (XARELTO) 10 MG TABS tablet Take 1 tablet (10 mg total) by mouth daily.   Marland Kitchen acetaminophen (TYLENOL) 325 MG tablet Take 650 mg by mouth every 6 (six) hours as needed (For pain.).    Marland Kitchen desonide (DESOWEN) 0.05 % cream Apply topically 2 (two) times daily. (Patient not taking: Reported on 09/03/2014)   . [DISCONTINUED] ergocalciferol (VITAMIN D2) 50000 UNITS capsule Take 1 capsule (50,000 Units total) by mouth once a week. (Patient taking differently: Take 50,000 Units by mouth every Monday. )   . [DISCONTINUED] ondansetron (ZOFRAN) 8 MG tablet Take 1 tablet (8 mg total) by mouth every 8 (eight) hours as needed for nausea or vomiting.   . [DISCONTINUED] zolpidem (AMBIEN) 5 MG tablet TAKE 1 TABLET BY MOUTH AS NEEDED FOR SLEEP 09/03/2014: Received from: External Pharmacy   No facility-administered encounter medications on file as of 09/03/2014.    Allergies  Allergen Reactions  . Zinc Hives  . Latex Rash   ROS: The patient denies anorexia, fever,  headaches, vision changes, decreased hearing, ear pain, sore throat, breast concerns, chest pain, palpitations, dizziness, syncope, dyspnea on exertion, cough, swelling, nausea, vomiting, diarrhea, constipation, abdominal pain, melena, hematochezia, indigestion/heartburn, hematuria, incontinence, dysuria, vaginal bleeding, discharge, odor or itch, genital lesions, numbness, tingling, weakness, tremor, suspicious skin lesions, depression, anxiety, abnormal bleeding/bruising, or enlarged lymph nodes. She has lost over 13# since her knee replacements. Slight constant, vaginal itch (unchanged). Vagisil sometimes helps.  Has been using some more hygiene wipes recently. Still has some residual left knee pain.  Right only hurts some after PT.   PHYSICAL EXAM:  BP 118/74 mmHg  Ht _0  (1.676 m)  Wt 186 lb 9.6 oz (84.641 kg)  BMI 30.13 kg/m2  General Appearance:   Alert, cooperative, no distress, appears stated age  Head:   Normocephalic, without obvious abnormality, atraumatic  Eyes:   PERRL, conjunctiva/corneas clear, EOM's intact, fundi   benign  Ears:   Normal TM's and external ear canals  Nose:  Nares normal, mucosa mildly edematous with clear mucus, no sinustenderness  Throat:  Lips, mucosa, and tongue normal; teeth and gums normal. There is a white spot on the left tonsils, in the crypt. Tonsil is normal, no erythema or swelling.  Neck:  Supple, no lymphadenopathy; thyroid: no enlargement/tenderness/nodules; no carotid  bruit or JVD  Back:  Spine nontender, no curvature, ROM normal, no CVA tenderness  Lungs:   Clear to auscultation bilaterally without wheezes, rales or ronchi; respirations unlabored  Chest Wall:   No tenderness or deformity  Heart:   Regular rate and rhythm, S1 and S2 normal, no murmur, rub  or gallop  Breast Exam:   No tenderness, masses, or nipple discharge or inversion. No axillary lymphadenopathy  Abdomen:    Soft, non-tender, nondistended, normoactive bowel sounds,   no masses, no hepatosplenomegaly  Genitalia:   mild erythema and excoriations of external vaginal area, L>R. No crusting. BUS and vagina normal; uterus is surgically absent; adnexa not enlarged, nontender, no masses. Pap not performed  Rectal:   Normal tone, no masses or tenderness; guaiac negative stool  Extremities:  No clubbing, cyanosis or edema. Thickened, discolored right 2nd toenail, others are normal. nontender  Pulses:  2+ and symmetric all extremities  Skin:  Skin color, texture, turgor normal, no rashes or lesions  Lymph nodes:  Cervical, supraclavicular, and axillary nodes normal  Neurologic:  CNII-XII intact, normal strength, sensation and gait; reflexes 2+ and symmetric throughout   Psych: Normal mood, affect, hygiene and grooming.         ASSESSMENT/PLAN:  Annual physical exam - Plan: POCT Urinalysis Dipstick, Visual acuity screening, Vit D  25 hydroxy (rtn osteoporosis monitoring), TSH, CBC with Differential/Platelet, Comprehensive metabolic panel, Hepatitis C Antibody  Major depression in remission - trial of Wellbutrin XL 166m (taper down dose) - Plan: buPROPion (WELLBUTRIN XL) 150 MG 24 hr tablet  Hypothyroidism, unspecified hypothyroidism type - euthyroid by history; intentional weight loss.  Recheck TSH - Plan: TSH  Essential hypertension, benign - controlled  Vitamin D deficiency - due for recheck. continue daily supplement - Plan: Vit D  25 hydroxy (rtn osteoporosis monitoring)  Screening for viral disease - Hep C screening for baby boomer - Plan: Hepatitis C Antibody  Anemia, unspecified anemia type - low Hg postoperatively; recheck today - Plan: CBC with Differential/Platelet  Vaginal itching - possibly related to the wipes she is using--d/c. OTC HC BID for up to a week, if needed  Discussed monthly self breast exams and yearly  mammograms (past due and reminded to schedule); at least 30 minutes of aerobic activity at least 5 days/week, weight-bearing exercise at least 2x/week (upper and lower body); proper sunscreen use reviewed; healthy diet, including goals of calcium and vitamin D intake and alcohol recommendations (less than or equal to 1 drink/day) reviewed; regular seatbelt use; changing batteries in smoke detectors. Immunization recommendations discussed--flu shot given today. Colonoscopy recommendations reviewed--UTD.  Trial of lowering Wellbutrin dose.  Start taking 1583min place of the 30019m If you notice any recurrent symptoms of depression, irritability, etc, then resume the 300m83mse (there are still refills of the 300mg57mthe pharmacy).  If you are doing well on the 150mg 62m I see you next time, then we can discuss tapering it off completely.  I  wouldn't want to taper off until after you are back at work and doing well.  Stop using the cleansing wipes--this may be contributing to your external vaginal itching. You may use a 1% hydrocortisone cream twice daily for up to a week to this area to try and heal it up and stop the itching.   CBC (last Hg 9.7 post-op) Vit D, c-met, TSH Hep C Ab   F/u 6 months for fasting med check

## 2014-09-03 NOTE — Patient Instructions (Addendum)
  HEALTH MAINTENANCE RECOMMENDATIONS:  It is recommended that you get at least 30 minutes of aerobic exercise at least 5 days/week (for weight loss, you may need as much as 60-90 minutes). This can be any activity that gets your heart rate up. This can be divided in 10-15 minute intervals if needed, but try and build up your endurance at least once a week.  Weight bearing exercise is also recommended twice weekly.  Eat a healthy diet with lots of vegetables, fruits and fiber.  "Colorful" foods have a lot of vitamins (ie green vegetables, tomatoes, red peppers, etc).  Limit sweet tea, regular sodas and alcoholic beverages, all of which has a lot of calories and sugar.  Up to 1 alcoholic drink daily may be beneficial for women (unless trying to lose weight, watch sugars).  Drink a lot of water.  Calcium recommendations are 1200-1500 mg daily (1500 mg for postmenopausal women or women without ovaries), and vitamin D 1000 IU daily.  This should be obtained from diet and/or supplements (vitamins), and calcium should not be taken all at once, but in divided doses.  Monthly self breast exams and yearly mammograms for women over the age of 51 is recommended.  Sunscreen of at least SPF 30 should be used on all sun-exposed parts of the skin when outside between the hours of 10 am and 4 pm (not just when at beach or pool, but even with exercise, golf, tennis, and yard work!)  Use a sunscreen that says "broad spectrum" so it covers both UVA and UVB rays, and make sure to reapply every 1-2 hours.  Remember to change the batteries in your smoke detectors when changing your clock times in the spring and fall.  Use your seat belt every time you are in a car, and please drive safely and not be distracted with cell phones and texting while driving.  Please call to schedule your mammogram (you are past due).  Trial of lowering Wellbutrin dose.  Start taking  in place of the .  If you notice any recurrent  symptoms of depression, irritability, etc, then resume the  dose (there are still refills of the  at the pharmacy).  If you are doing well on the  when I see you next time, then we can discuss tapering it off completely.  I wouldn't want to taper off until after you are back at work and doing well.  Stop using the cleansing wipes--this may be contributing to your external vaginal itching. You may use a 1% hydrocortisone cream twice daily for up to a week to this area to try and heal it up and stop the itching.

## 2014-09-04 LAB — VITAMIN D 25 HYDROXY (VIT D DEFICIENCY, FRACTURES): Vit D, 25-Hydroxy: 26 ng/mL — ABNORMAL LOW (ref 30–100)

## 2014-09-04 LAB — HEPATITIS C ANTIBODY: HCV Ab: NEGATIVE

## 2014-09-05 ENCOUNTER — Other Ambulatory Visit: Payer: Self-pay | Admitting: *Deleted

## 2014-09-05 MED ORDER — VITAMIN D (ERGOCALCIFEROL) 1.25 MG (50000 UNIT) PO CAPS: 50000.0000 [IU] | ORAL_CAPSULE | ORAL | Status: DC

## 2014-09-10 NOTE — Addendum Note (Signed)
Addended by: Debbrah Alar F on: 09/10/2014 10:55 AM   Modules accepted: Orders

## 2015-01-13 ENCOUNTER — Other Ambulatory Visit: Payer: Self-pay | Admitting: Family Medicine

## 2015-01-19 ENCOUNTER — Other Ambulatory Visit: Payer: Self-pay | Admitting: Family Medicine

## 2015-02-15 ENCOUNTER — Other Ambulatory Visit: Payer: Self-pay | Admitting: Family Medicine

## 2015-02-15 NOTE — Telephone Encounter (Signed)
She has appt for med check 2/16--looks like this was made with [facility]. This is likely an error, but please check with patient and r/s to my schedule if an error.    Okay to refill #30, no refills (refills will be done at her visit)--I don't want her to run out before her visit.

## 2015-02-15 NOTE — Telephone Encounter (Signed)
Is this okay to refill? 

## 2015-02-15 NOTE — Telephone Encounter (Signed)
Called and left detailed message on pt phone that she is scheduled with [facility]ane and looks like it was done by mistake but Dr. Lynelle Doctor doesn't have an opening at the same time so it needs to be rescheduled to a different time or another day. i will go ahead and refill her meds for 30 days.

## 2015-03-07 ENCOUNTER — Encounter: Payer: Self-pay | Admitting: Medical

## 2015-03-13 ENCOUNTER — Encounter: Payer: Self-pay | Admitting: Family Medicine

## 2015-03-13 ENCOUNTER — Ambulatory Visit (INDEPENDENT_AMBULATORY_CARE_PROVIDER_SITE_OTHER): Payer: BLUE CROSS/BLUE SHIELD | Admitting: Family Medicine

## 2015-03-13 VITALS — BP 108/72 | HR 68 | Ht 66.0 in | Wt 197.6 lb

## 2015-03-13 DIAGNOSIS — E039 Hypothyroidism, unspecified: Secondary | ICD-10-CM

## 2015-03-13 DIAGNOSIS — I10 Essential (primary) hypertension: Secondary | ICD-10-CM | POA: Diagnosis not present

## 2015-03-13 DIAGNOSIS — F325 Major depressive disorder, single episode, in full remission: Secondary | ICD-10-CM | POA: Diagnosis not present

## 2015-03-13 DIAGNOSIS — L309 Dermatitis, unspecified: Secondary | ICD-10-CM | POA: Diagnosis not present

## 2015-03-13 DIAGNOSIS — E559 Vitamin D deficiency, unspecified: Secondary | ICD-10-CM | POA: Diagnosis not present

## 2015-03-13 MED ORDER — LISINOPRIL-HYDROCHLOROTHIAZIDE 10-12.5 MG PO TABS: 1.0000 | ORAL_TABLET | Freq: Every day | ORAL | Status: DC

## 2015-03-13 MED ORDER — BUPROPION HCL ER (XL) 300 MG PO TB24: 300.0000 mg | ORAL_TABLET | Freq: Every day | ORAL | Status: DC

## 2015-03-13 MED ORDER — DESONIDE 0.05 % EX CREA: TOPICAL_CREAM | Freq: Two times a day (BID) | CUTANEOUS | Status: DC | PRN

## 2015-03-13 NOTE — Patient Instructions (Signed)
  Please take your vitamins at night, not in the morning, as it needs to be separate from your thyroid medication. If you aren't getting  of calcium from your diet (3-4 servings/day), then you should take Calcium+D (once daily if having 2 servings a day, twice daily if not getting any in your diet). Continue the 1000 IU of Vitamin D daily.   If you get a rash that isn't resolving with the desonide cream, please STOP using it, and come to the office for evaluation.  If there is any yeast infection, this may make it worse (even if it helps with the itching).  Dry skin--drink plenty of fluid, stay well moisturized, and take shorter showers, not too hot.  Rash on neck--you may use the steroid cream as needed.  See if any products could be contributing (hair, soap, etc). Consider seeing dermatologist if ongoing skin concerns.  Try and get regular exercise, work on stress reduction. If your thyroid is okay, and moods stay bad, then we may need to change your medications. Schedule a follow-up appointment to discuss depression if we don't need to make changes to your thyroid medication, and moods aren't improving with stress reduction techniques we discussed. If the thyroid is off, I wouldn't recommend changing depression meds just yet.

## 2015-03-13 NOTE — Progress Notes (Signed)
Chief Complaint  Patient presents with  . Hypothyroidism    fasting med check. Feels like her thyroid might be off- weight gain, crying and also ridges in her nails (someone told her this is a sign). Has also been having some acne breakouts x 6 months. Has tried lots of OTC products without any help.    She is asking for rx for desonide 0.05% cream. She initially got from her dermatologist for a rash under arm/axilla.  Rash is usually red, itchy, sometimes weepy.  It comes intermittently, and she needs a refill. She uses the desonide and it clears it up very quickly. She is also complaining of breaking out on her neck, face, chest.  Unsure if it could be related to her hair products.  Hypothyroidism follow-up: She has been taking her thyroid medication along with all of her other medications, as she always has. She takes it an hour before food. She has not missed any doses. +weight gain, moodiness, easily crying. Skin is dry on arms/legs (typical in winter; takes hot showers), but now her skin is breaking out also (face, neck, chest). No change in bowels. Some fatigue in the afternoons, yawning. Some mild vertical ridging noted in the nails, no color changes or brittleness. She started taking hair/skin/nail vitamin about a month ago, taking along with her thyroid medication. Gained 10-11# in the last 6 months. 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  Hypertension follow-up: Blood pressures are not checked elsewhere. Denies dizziness, headaches, chest pain, muscle cramps. Denies side effects of medications.   Depression: She was put on medication since her divorce. She has never tried to come off. Moods seemed to get worse after going back to work, +stress. +crying, some irritability (is able to hold it in at work).  Vitamin D deficiency: She has consistently been taking 1000 IU of Vitamin D daily.  She isn't currently taking Calcium with D. Last  Vitamin D level was 08/2014, level was 26 and she was treated with an additional 8 wks of rx.  PMH, PSH, SH updated and reviewed  Outpatient Encounter Prescriptions as of 03/13/2015  Medication Sig Note  . aspirin 81 MG tablet Take 81 mg by mouth daily.   Marland Kitchen buPROPion (WELLBUTRIN XL) 300 MG 24 hr tablet Take 1 tablet (300 mg total) by mouth daily.   . cholecalciferol (VITAMIN D) 1000 UNITS tablet Take 1,000 Units by mouth daily.   Marland Kitchen lisinopril-hydrochlorothiazide (PRINZIDE,ZESTORETIC) 10-12.5 MG tablet Take 1 tablet by mouth daily.   . Multiple Vitamins-Minerals (HAIR/SKIN/NAILS/BIOTIN PO) Take 1 tablet by mouth daily.   Marland Kitchen SYNTHROID 75 MCG tablet TAKE 1 TABLET BY MOUTH EVERY DAY   . [DISCONTINUED] buPROPion (WELLBUTRIN XL) 300 MG 24 hr tablet TAKE 1 TABLET (300 MG TOTAL) BY MOUTH EVERY MORNING.   . [DISCONTINUED] lisinopril-hydrochlorothiazide (PRINZIDE,ZESTORETIC) 10-12.5 MG tablet TAKE 1 TABLET BY MOUTH DAILY.   Marland Kitchen acetaminophen (TYLENOL) 325 MG tablet Take 650 mg by mouth every 6 (six) hours as needed (For pain.). Reported on 03/13/2015   . desonide (DESOWEN) 0.05 % cream Apply topically 2 (two) times daily as needed.   . [DISCONTINUED] buPROPion (WELLBUTRIN XL) 150 MG 24 hr tablet Take 1 tablet (150 mg total) by mouth daily.   . [DISCONTINUED] desonide (DESOWEN) 0.05 % cream Apply topically 2 (two) times daily. (Patient not taking: Reported on 09/03/2014)   . [DISCONTINUED] docusate sodium (COLACE) 100 MG capsule Take 1 capsule (100 mg total)  by mouth 2 (two) times daily as needed for mild constipation. 09/03/2014: Taking once daily  . [DISCONTINUED] methocarbamol (ROBAXIN) 500 MG tablet Take 1 tablet (500 mg total) by mouth 3 (three) times daily. (Patient not taking: Reported on 03/13/2015) 09/03/2014: Takes once daily  . [DISCONTINUED] oxyCODONE-acetaminophen (PERCOCET) 7.5-325 MG per tablet Take 1-2 tablets by mouth every 4 (four) hours as needed. 09/03/2014: Take at nighttime on the days she has PT  (about 2x/week)  . [DISCONTINUED] SYNTHROID 75 MCG tablet TAKE 1 TABLET BY MOUTH EVERY DAY   . [DISCONTINUED] Vitamin D, Ergocalciferol, (DRISDOL) 50000 UNITS CAPS capsule Take 1 capsule (50,000 Units total) by mouth once a week.    No facility-administered encounter medications on file as of 03/13/2015.   Allergies  Allergen Reactions  . Zinc Hives  . Latex Rash   ROS: no fever, chills, headaches, dizziness, chest pain, palpitations ,URI symptoms, cough, shortness of breath, nausea, vomiting, urinary complaints, bleeding, bruising. +mood changes, skin rash, weight gain as per HPI.  PHYSICAL EXAM: BP 108/72 mmHg  Pulse 68  Ht  (1.676 m)  Wt 197 lb 9.6 oz (89.631 kg)  BMI 31.91 kg/m2 Well developed, pleasant female in good spirits, in no distress HEENT: PERRL, EOMI, conjunctiva and sclera are clear.  OP is clear Neck: no lymphadenopathy, thyromegaly or mass Heart: regular rate and rhythm Lungs: clear bilaterally Back: no spinal or CVA tenderness Extremities: no edema, 2+ pulses Skin: 2 scabbed areas on the left neck (excoriated) Chest--1-2 slightly raised areas, no pustules Psych: normal mood, affect, hygiene and grooming Neuro: alert and oriented, cranial nerves intact, normal strength, gait  ASSESSMENT/PLAN:  Hypothyroidism, unspecified hypothyroidism type - pt to separate thyroid medication from vitamins - Plan: TSH  Essential hypertension, benign - controlled - Plan: lisinopril-hydrochlorothiazide (PRINZIDE,ZESTORETIC) 10-12.5 MG tablet  Vitamin D deficiency - Plan: VITAMIN D 25 Hydroxy (Vit-D Deficiency, Fractures)  Major depression in remission (HCC) - some increased irritability and depression since returning back to work; may need med changes if thyroid is normal - Plan: buPROPion (WELLBUTRIN XL) 300 MG 24 hr tablet  Dermatitis - poss contact derm; topical cortisone cream prn - Plan: desonide (DESOWEN) 0.05 % cream    Please take your vitamins at night, not in  the morning, as it needs to be separate from your thyroid medication. If you aren't getting  of calcium from your diet (3-4 servings/day), then you should take Calcium+D (once daily if having 2 servings a day, twice daily if not getting any in your diet). Continue the 1000 IU of Vitamin D daily.   If you get a rash that isn't resolving with the desonide cream, please STOP using it, and come to the office for evaluation.  If there is any yeast infection, this may make it worse (even if it helps with the itching).  Dry skin--drink plenty of fluid, stay well moisturized, and take shorter showers, not too hot.  Rash on neck--you may use the steroid cream as needed.  See if any products could be contributing (hair, soap, etc). Consider seeing dermatologist if ongoing skin concerns.  Try and get regular exercise, work on stress reduction. If your thyroid is okay, and moods stay bad, then we may need to change your medications. Schedule a follow-up appointment to discuss depression if we don't need to make changes to your thyroid medication, and moods aren't improving with stress reduction techniques we discussed. If the thyroid is off, I wouldn't recommend changing depression meds just yet.

## 2015-03-14 LAB — VITAMIN D 25 HYDROXY (VIT D DEFICIENCY, FRACTURES): VIT D 25 HYDROXY: 32 ng/mL (ref 30–100)

## 2015-03-14 LAB — TSH: TSH: 1.15 mIU/L

## 2015-03-15 ENCOUNTER — Other Ambulatory Visit: Payer: Self-pay | Admitting: Family Medicine

## 2015-05-09 ENCOUNTER — Encounter: Payer: Self-pay | Admitting: Family Medicine

## 2015-05-09 ENCOUNTER — Ambulatory Visit (INDEPENDENT_AMBULATORY_CARE_PROVIDER_SITE_OTHER): Payer: BLUE CROSS/BLUE SHIELD | Admitting: Family Medicine

## 2015-05-09 VITALS — BP 120/64 | HR 76 | Temp 98.2°F | Resp 16 | Wt 199.0 lb

## 2015-05-09 DIAGNOSIS — J029 Acute pharyngitis, unspecified: Secondary | ICD-10-CM

## 2015-05-09 DIAGNOSIS — R6889 Other general symptoms and signs: Secondary | ICD-10-CM | POA: Diagnosis not present

## 2015-05-09 DIAGNOSIS — R05 Cough: Secondary | ICD-10-CM | POA: Diagnosis not present

## 2015-05-09 DIAGNOSIS — R059 Cough, unspecified: Secondary | ICD-10-CM

## 2015-05-09 LAB — POC INFLUENZA A&B (BINAX/QUICKVUE)
INFLUENZA A, POC: NEGATIVE
Influenza B, POC: NEGATIVE

## 2015-05-09 LAB — POCT RAPID STREP A (OFFICE): Rapid Strep A Screen: NEGATIVE

## 2015-05-09 MED ORDER — AZITHROMYCIN 250 MG PO TABS: ORAL_TABLET | ORAL | Status: DC

## 2015-05-09 MED ORDER — ALBUTEROL SULFATE HFA 108 (90 BASE) MCG/ACT IN AERS: 2.0000 | INHALATION_SPRAY | Freq: Four times a day (QID) | RESPIRATORY_TRACT | Status: DC | PRN

## 2015-05-09 NOTE — Progress Notes (Signed)
   Subjective:    Patient ID: [name], female    DOB: 25-Nov-1955, 60 y.o.   MRN: [id number]  HPI Chief Complaint  Patient presents with  . sick    sick sinus monday- sore throat with drainage, cough, head congestion, chest congestion- sinus pressure, headache. ears feel full   She is here with complaints of 4 day history nasal congestion, ears feeling clogged, watery eyes, dry cough, sore throat, chills, and occasional chest tightness with coughing.  Denies fever, body aches, headache, nausea, vomiting, diarrhea. States she has alka seltzer cold plus. States cough is getting worse.   [employer] employee. Positive flu contact this week. She did get a flu shot. Does not smoke.  Denies history of bronchitis or pneumonia. No asthma or COPD.   Review of Systems Pertinent positives and negatives in the history of present illness.     Objective:   Physical Exam BP 120/64 mmHg  Pulse 76  Temp(Src) 98.2 F (36.8 C) (Oral)  Resp 16  Wt 199 lb (90.266 kg)  SpO2 97%  Alert and in no distress. Mild maxillary sinus tenderness otherwise nontender. Tympanic membranes and canals are normal. Pharyngeal area is Erythematous without edema or exudate. Neck is supple without adenopathy or thyromegaly. Cardiac exam shows a regular sinus rhythm without murmurs or gallops. Lungs are clear to auscultation.  Flu swab: Negative Rapid strep: Negative     Assessment & Plan:  Flu-like symptoms - Plan: POC Influenza A&B(BINAX/QUICKVUE)  Cough  Acute pharyngitis, unspecified etiology - Plan: POCT rapid strep A  Discussed that her flu and strep tests were negative. Suspect that her symptoms are related to a viral etiology and recommend symptomatically treatment such as saltwater gargles, Tylenol or ibuprofen, Mucinex DM or Robitussin-DM. Recommend staying well hydrated. Discussed that I am sending an antibiotic to her pharmacy and would like for her to hold off on this for another 2-3 days and see if  she is turning the corner. She is not feeling better by Sunday then she may start this antibiotic and then as know if she is not back to baseline after completing it. Discussed that the antibiotic is a 5 day course however continues working for 10 days.

## 2015-05-09 NOTE — Patient Instructions (Addendum)
Your flu and strep swabs were both negative today. I recommend treating your symptoms, tylenol or Ibuprofen for pain, Try salt water gargles for your sore throat, use the albuterol inhaler as needed for cough. You can try Robitussin-DM or Mucinex DM for your symptoms. Stay well hydrated. I am sending in an antibiotic, I recommend giving this 2-3 more days and if you're not improving start the antibiotic. If you are not better after 10 days let me know.

## 2015-07-15 ENCOUNTER — Other Ambulatory Visit: Payer: Self-pay | Admitting: Family Medicine

## 2015-09-15 ENCOUNTER — Other Ambulatory Visit: Payer: Self-pay | Admitting: Family Medicine

## 2015-09-15 DIAGNOSIS — F325 Major depressive disorder, single episode, in full remission: Secondary | ICD-10-CM

## 2015-09-15 DIAGNOSIS — I10 Essential (primary) hypertension: Secondary | ICD-10-CM

## 2015-10-11 ENCOUNTER — Other Ambulatory Visit: Payer: Self-pay | Admitting: Family Medicine

## 2015-10-11 ENCOUNTER — Telehealth: Payer: Self-pay | Admitting: Family Medicine

## 2015-10-11 MED ORDER — LEVOTHYROXINE SODIUM 75 MCG PO TABS: 75.0000 ug | ORAL_TABLET | Freq: Every day | ORAL | 0 refills | Status: DC

## 2015-10-11 NOTE — Telephone Encounter (Signed)
Pt has an appt with you 10/28/15 I refilled med for 30 days to get her to her appt but pharmacy requesting 90 days. Is this okay to refill med for 90 days. Last lab was back in february

## 2015-10-11 NOTE — Telephone Encounter (Signed)
Rcvd note from pharmacy stating that Synthroid script should be  90 day script

## 2015-10-11 NOTE — Telephone Encounter (Signed)
It is fine to refill #90.  Thanks

## 2015-10-11 NOTE — Telephone Encounter (Signed)
Sent in 90 days  

## 2015-10-25 NOTE — Progress Notes (Deleted)
[name] is a 60 y.o. female who presents for a complete physical.  She has the following concerns:  Hypothyroidism follow-up: She has been taking her thyroid medication along with all of her other medications, as she always has. She takes it an hour before food. She has not missed any doses.  Last TSH was normal in February. At that time she was reporting weight gain, fatigue, dry skin, irritability/easy crying.  She had recently gone back to work after being out for bilateral knee replacements.  Lab Results  Component Value Date   TSH 1.15 03/13/2015   Hypertension follow-up: Blood pressures are not checked elsewhere. Denies dizziness, headaches, chest pain, muscle cramps. Denies side effects of medications.   Depression: She was put on medication since her divorce. She has never tried to come off. Moods seemed to get worse after going back to work (after being out for her bilateral knee replacements), +stress. +crying, some irritability (is able to hold it in at work).  Vitamin D deficiency: Last prescription treatment was in 08/2014 when level was 26; she was treated with an additional 8 wks of rx. Last check was in 02/2015, and level was normal at 32.  She is currently taking  Immunization History  Administered Date(s) Administered  . Influenza Split 10/12/2011  . Influenza,inj,Quad PF,36+ Mos 01/09/2013, 02/07/2014, 09/03/2014  . Tdap 09/19/2008   Last Pap smear: last GYN exam 04/2012; pap n/a due to hysterectomy Last mammogram: 04/2012 Last colonoscopy: 12/2007 Last DEXA: never Dentist: once yearly, past due Ophtho: once yearly, wears contacts Exercise:    ROS: The patient denies anorexia, fever, headaches, vision changes, decreased hearing, ear pain, sore throat, breast concerns, chest pain, palpitations, dizziness, syncope, dyspnea on exertion, cough, swelling, nausea, vomiting, diarrhea, constipation, abdominal pain, melena, hematochezia,  indigestion/heartburn, hematuria, incontinence, dysuria, vaginal bleeding, discharge, odor or itch, genital lesions, numbness, tingling, weakness, tremor, suspicious skin lesions, depression, anxiety, abnormal bleeding/bruising, or enlarged lymph nodes.  Still has some residual left knee pain.  Right only hurts some after PT.   PHYSICAL EXAM:   General Appearance:   Alert, cooperative, no distress, appears stated age  Head:   Normocephalic, without obvious abnormality, atraumatic  Eyes:   PERRL, conjunctiva/corneas clear, EOM's intact, fundi   benign  Ears:   Normal TM's and external ear canals  Nose:  Nares normal, mucosa mildly edematous with clear mucus, no sinustenderness  Throat:  Lips, mucosa, and tongue normal; teeth and gums normal. There is a white spot on the left tonsils, in the crypt. Tonsil is normal, no erythema or swelling.  Neck:  Supple, no lymphadenopathy; thyroid: no enlargement/tenderness/nodules; no carotid  bruit or JVD  Back:  Spine nontender, no curvature, ROM normal, no CVA tenderness  Lungs:   Clear to auscultation bilaterally without wheezes, rales or ronchi; respirations unlabored  Chest Wall:   No tenderness or deformity  Heart:   Regular rate and rhythm, S1 and S2 normal, no murmur, rub  or gallop  Breast Exam:   No tenderness, masses, or nipple discharge or inversion. No axillary lymphadenopathy  Abdomen:   Soft, non-tender, nondistended, normoactive bowel sounds,   no masses, no hepatosplenomegaly  Genitalia:   mild erythema and excoriations of external vaginal area, L>R. No crusting. BUS and vagina normal; uterus is surgically absent; adnexa not enlarged, nontender, no masses. Pap not performed  Rectal:   Normal tone, no masses or tenderness; guaiac negative stool  Extremities:  No clubbing, cyanosis or edema. Thickened, discolored  right 2nd toenail, others are normal. nontender   Pulses:  2+ and symmetric all extremities  Skin:  Skin color, texture, turgor normal, no rashes or lesions  Lymph nodes:  Cervical, supraclavicular, and axillary nodes normal  Neurologic:  CNII-XII intact, normal strength, sensation and gait; reflexes 2+ and symmetric throughout   Psych: Normal mood, affect, hygiene and grooming   ASSESSMENT/PLAN:   Discussed monthly self breast exams and yearly mammograms (past due and reminded to schedule); at least 30 minutes of aerobic activity at least 5 days/week, weight-bearing exercise at least 2x/week (upper and lower body); proper sunscreen use reviewed; healthy diet, including goals of calcium and vitamin D intake and alcohol recommendations (less than or equal to 1 drink/day) reviewed; regular seatbelt use; changing batteries in smoke detectors. Immunization recommendations discussed--flu shot given today. Colonoscopy recommendations reviewed--UTD.

## 2015-10-28 ENCOUNTER — Telehealth: Payer: Self-pay | Admitting: *Deleted

## 2015-10-28 ENCOUNTER — Encounter: Payer: BLUE CROSS/BLUE SHIELD | Admitting: Family Medicine

## 2015-10-28 DIAGNOSIS — Z0000 Encounter for general adult medical examination without abnormal findings: Secondary | ICD-10-CM

## 2015-10-28 NOTE — Telephone Encounter (Signed)
Needs to be sent letter, charged no-show fee. Needs to reschedule CPE (at latest February, since labs due then)

## 2015-10-28 NOTE — Telephone Encounter (Signed)
This patient no showed for their appointment today.Which of the following is necessary for this patient.   A) No follow-up necessary   B) Follow-up urgent. Locate Patient Immediately.   C) Follow-up necessary. Contact patient and Schedule visit in ____ Days.   D) Follow-up Advised. Contact patient and Schedule visit in ____ Days. 

## 2015-11-01 ENCOUNTER — Encounter: Payer: Self-pay | Admitting: Family Medicine

## 2015-11-01 NOTE — Telephone Encounter (Signed)
No show letter sent. Back to v for call.

## 2015-12-17 ENCOUNTER — Other Ambulatory Visit: Payer: Self-pay | Admitting: Family Medicine

## 2015-12-17 ENCOUNTER — Other Ambulatory Visit: Payer: Self-pay

## 2015-12-17 ENCOUNTER — Telehealth: Payer: Self-pay | Admitting: Family Medicine

## 2015-12-17 DIAGNOSIS — I10 Essential (primary) hypertension: Secondary | ICD-10-CM

## 2015-12-17 DIAGNOSIS — F325 Major depressive disorder, single episode, in full remission: Secondary | ICD-10-CM

## 2015-12-17 MED ORDER — LEVOTHYROXINE SODIUM 75 MCG PO TABS: 75.0000 ug | ORAL_TABLET | Freq: Every day | ORAL | 0 refills | Status: DC

## 2015-12-17 MED ORDER — LISINOPRIL-HYDROCHLOROTHIAZIDE 10-12.5 MG PO TABS: 1.0000 | ORAL_TABLET | Freq: Every day | ORAL | 0 refills | Status: DC

## 2015-12-17 MED ORDER — BUPROPION HCL ER (XL) 300 MG PO TB24: 300.0000 mg | ORAL_TABLET | Freq: Every day | ORAL | 0 refills | Status: DC

## 2015-12-17 NOTE — Telephone Encounter (Signed)
Pt called and made a appt for December. Please refill med.

## 2015-12-17 NOTE — Telephone Encounter (Signed)
Is this okay to refill see a no show letter has gone out

## 2015-12-17 NOTE — Telephone Encounter (Signed)
Deny--needs to contact office and set up med check. Once she schedules appt, okay to refill #30d

## 2015-12-17 NOTE — Telephone Encounter (Signed)
Ok to RF meds x 30 that were requested and denied earlier today

## 2015-12-17 NOTE — Telephone Encounter (Signed)
Sent in 30 days Wellbutrin,lisinopril,levothyroxine

## 2015-12-18 ENCOUNTER — Other Ambulatory Visit: Payer: Self-pay | Admitting: *Deleted

## 2015-12-18 ENCOUNTER — Telehealth: Payer: Self-pay | Admitting: Family Medicine

## 2015-12-18 MED ORDER — LEVOTHYROXINE SODIUM 75 MCG PO TABS: 75.0000 ug | ORAL_TABLET | Freq: Every day | ORAL | 0 refills | Status: DC

## 2015-12-18 NOTE — Telephone Encounter (Signed)
Done

## 2015-12-18 NOTE — Telephone Encounter (Signed)
[facility] req 90 day supply for Levothyroxine 75 mcg
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
## 2016-01-08 ENCOUNTER — Telehealth: Payer: Self-pay | Admitting: Family Medicine

## 2016-01-08 ENCOUNTER — Ambulatory Visit (INDEPENDENT_AMBULATORY_CARE_PROVIDER_SITE_OTHER): Payer: BLUE CROSS/BLUE SHIELD | Admitting: Family Medicine

## 2016-01-08 ENCOUNTER — Encounter: Payer: Self-pay | Admitting: Family Medicine

## 2016-01-08 VITALS — BP 108/70 | HR 72 | Temp 98.1°F | Ht 66.0 in | Wt 201.2 lb

## 2016-01-08 DIAGNOSIS — F325 Major depressive disorder, single episode, in full remission: Secondary | ICD-10-CM

## 2016-01-08 DIAGNOSIS — J0100 Acute maxillary sinusitis, unspecified: Secondary | ICD-10-CM | POA: Diagnosis not present

## 2016-01-08 DIAGNOSIS — I10 Essential (primary) hypertension: Secondary | ICD-10-CM

## 2016-01-08 DIAGNOSIS — Z5181 Encounter for therapeutic drug level monitoring: Secondary | ICD-10-CM | POA: Diagnosis not present

## 2016-01-08 DIAGNOSIS — Z23 Encounter for immunization: Secondary | ICD-10-CM

## 2016-01-08 DIAGNOSIS — E039 Hypothyroidism, unspecified: Secondary | ICD-10-CM

## 2016-01-08 DIAGNOSIS — E559 Vitamin D deficiency, unspecified: Secondary | ICD-10-CM

## 2016-01-08 DIAGNOSIS — R059 Cough, unspecified: Secondary | ICD-10-CM

## 2016-01-08 DIAGNOSIS — R05 Cough: Secondary | ICD-10-CM | POA: Diagnosis not present

## 2016-01-08 LAB — CBC WITH DIFFERENTIAL/PLATELET
BASOS ABS: 86 {cells}/uL (ref 0–200)
Basophils Relative: 1 %
EOS ABS: 344 {cells}/uL (ref 15–500)
EOS PCT: 4 %
HCT: 36.1 % (ref 35.0–45.0)
HEMOGLOBIN: 12 g/dL (ref 11.7–15.5)
Lymphocytes Relative: 26 %
Lymphs Abs: 2236 cells/uL (ref 850–3900)
MCH: 29.5 pg (ref 27.0–33.0)
MCHC: 33.2 g/dL (ref 32.0–36.0)
MCV: 88.7 fL (ref 80.0–100.0)
MONOS PCT: 10 %
MPV: 11.6 fL (ref 7.5–12.5)
Monocytes Absolute: 860 cells/uL (ref 200–950)
NEUTROS PCT: 59 %
Neutro Abs: 5074 cells/uL (ref 1500–7800)
PLATELETS: 205 10*3/uL (ref 140–400)
RBC: 4.07 MIL/uL (ref 3.80–5.10)
RDW: 14.9 % (ref 11.0–15.0)
WBC: 8.6 10*3/uL (ref 4.0–10.5)

## 2016-01-08 MED ORDER — AMOXICILLIN-POT CLAVULANATE 875-125 MG PO TABS: 1.0000 | ORAL_TABLET | Freq: Two times a day (BID) | ORAL | 0 refills | Status: DC

## 2016-01-08 MED ORDER — BENZONATATE 200 MG PO CAPS: 200.0000 mg | ORAL_CAPSULE | Freq: Three times a day (TID) | ORAL | 0 refills | Status: DC | PRN

## 2016-01-08 MED ORDER — LISINOPRIL-HYDROCHLOROTHIAZIDE 10-12.5 MG PO TABS: 1.0000 | ORAL_TABLET | Freq: Every day | ORAL | 3 refills | Status: DC

## 2016-01-08 MED ORDER — BUPROPION HCL ER (XL) 300 MG PO TB24: 300.0000 mg | ORAL_TABLET | Freq: Every day | ORAL | 3 refills | Status: DC

## 2016-01-08 NOTE — Patient Instructions (Signed)
  Drink plenty of fluids/water. You may continue to use decongestant medications.  We usually say to avoid decongestants (which the alka selzer you are taking has), but since your blood pressure is fine, it is okay to continue. Check the ingredients on the alka selzer. It likely contains dextromethorphan, which is a cough suppressant.  It is the same cough suppressant found in Robitussin DM (it is the DM), so don't use both together.  I recommend instead of the robitussin DM, getting plain Mucinex (guaifenesin alone, which is the expectorant that keeps the mucus thin).   Double check the label on your medications to avoid getting duplicate ingredients. I recommend trying sinus rinses once or twice daily (either sinus rinse kit or neti-pot). You can also use a nasal saline spray frequently throughout the day if you are having dryness, burning or any nosebleeds. Use the tessalon perles (cough medication that was prescribed) if needed.  Call in 4-7 days if you are not improving AT ALL, or if getting worse (high fever, ongoing discolored mucus, etc); if getting better, but not 100% better by the last pill, please call us and let us know that your symptoms have improved, but not completely resolved, and will we give a longer course of antibiotic.

## 2016-01-08 NOTE — Telephone Encounter (Signed)
Patient has med check appt today-Dr Lynelle DoctorKnapp will do this refill at the appt today.

## 2016-01-08 NOTE — Telephone Encounter (Signed)
[facility] req 90 day supply for Lisinopril

## 2016-01-09 ENCOUNTER — Encounter: Payer: Self-pay | Admitting: Family Medicine

## 2016-01-09 LAB — COMPREHENSIVE METABOLIC PANEL
ALK PHOS: 84 U/L (ref 33–130)
ALT: 9 U/L (ref 6–29)
AST: 12 U/L (ref 10–35)
Albumin: 4 g/dL (ref 3.6–5.1)
BILIRUBIN TOTAL: 0.3 mg/dL (ref 0.2–1.2)
BUN: 11 mg/dL (ref 7–25)
CO2: 25 mmol/L (ref 20–31)
Calcium: 9.2 mg/dL (ref 8.6–10.4)
Chloride: 102 mmol/L (ref 98–110)
Creat: 0.86 mg/dL (ref 0.50–1.05)
GLUCOSE: 84 mg/dL (ref 65–99)
Potassium: 3.9 mmol/L (ref 3.5–5.3)
SODIUM: 138 mmol/L (ref 135–146)
Total Protein: 7 g/dL (ref 6.1–8.1)

## 2016-01-09 LAB — TSH: TSH: 0.97 mIU/L

## 2016-01-09 LAB — LIPID PANEL
Cholesterol: 167 mg/dL (ref <200)
HDL: 52 mg/dL (ref >50)
LDL CALC: 96 mg/dL (ref <100)
Total CHOL/HDL Ratio: 3.2 Ratio (ref <5.0)
Triglycerides: 95 mg/dL (ref <150)
VLDL: 19 mg/dL (ref <30)

## 2016-01-09 LAB — VITAMIN D 25 HYDROXY (VIT D DEFICIENCY, FRACTURES): VIT D 25 HYDROXY: 34 ng/mL (ref 30–100)

## 2016-01-17 ENCOUNTER — Other Ambulatory Visit: Payer: Self-pay | Admitting: Family Medicine

## 2016-01-17 ENCOUNTER — Telehealth: Payer: Self-pay | Admitting: Family Medicine

## 2016-07-24 ENCOUNTER — Other Ambulatory Visit: Payer: Self-pay | Admitting: Family Medicine

## 2016-07-24 DIAGNOSIS — Z1231 Encounter for screening mammogram for malignant neoplasm of breast: Secondary | ICD-10-CM

## 2016-08-03 ENCOUNTER — Ambulatory Visit
Admission: RE | Admit: 2016-08-03 | Discharge: 2016-08-03 | Disposition: A | Discharge status: Home / Self Care | Payer: BLUE CROSS/BLUE SHIELD | Source: Ambulatory Visit | Attending: Family Medicine | Admitting: Family Medicine

## 2016-08-03 DIAGNOSIS — Z1231 Encounter for screening mammogram for malignant neoplasm of breast: Secondary | ICD-10-CM

## 2016-08-19 IMAGING — CR DG KNEE 1-2V*L*
2 series · 2 of 2 positions shown · non-contrast
Comparison: Left knee series of March 21, 2004

CLINICAL DATA: Left knee surgery planned for May 24, 2014

EXAM:
LEFT KNEE - 1-2 VIEW

[w knee ap left]
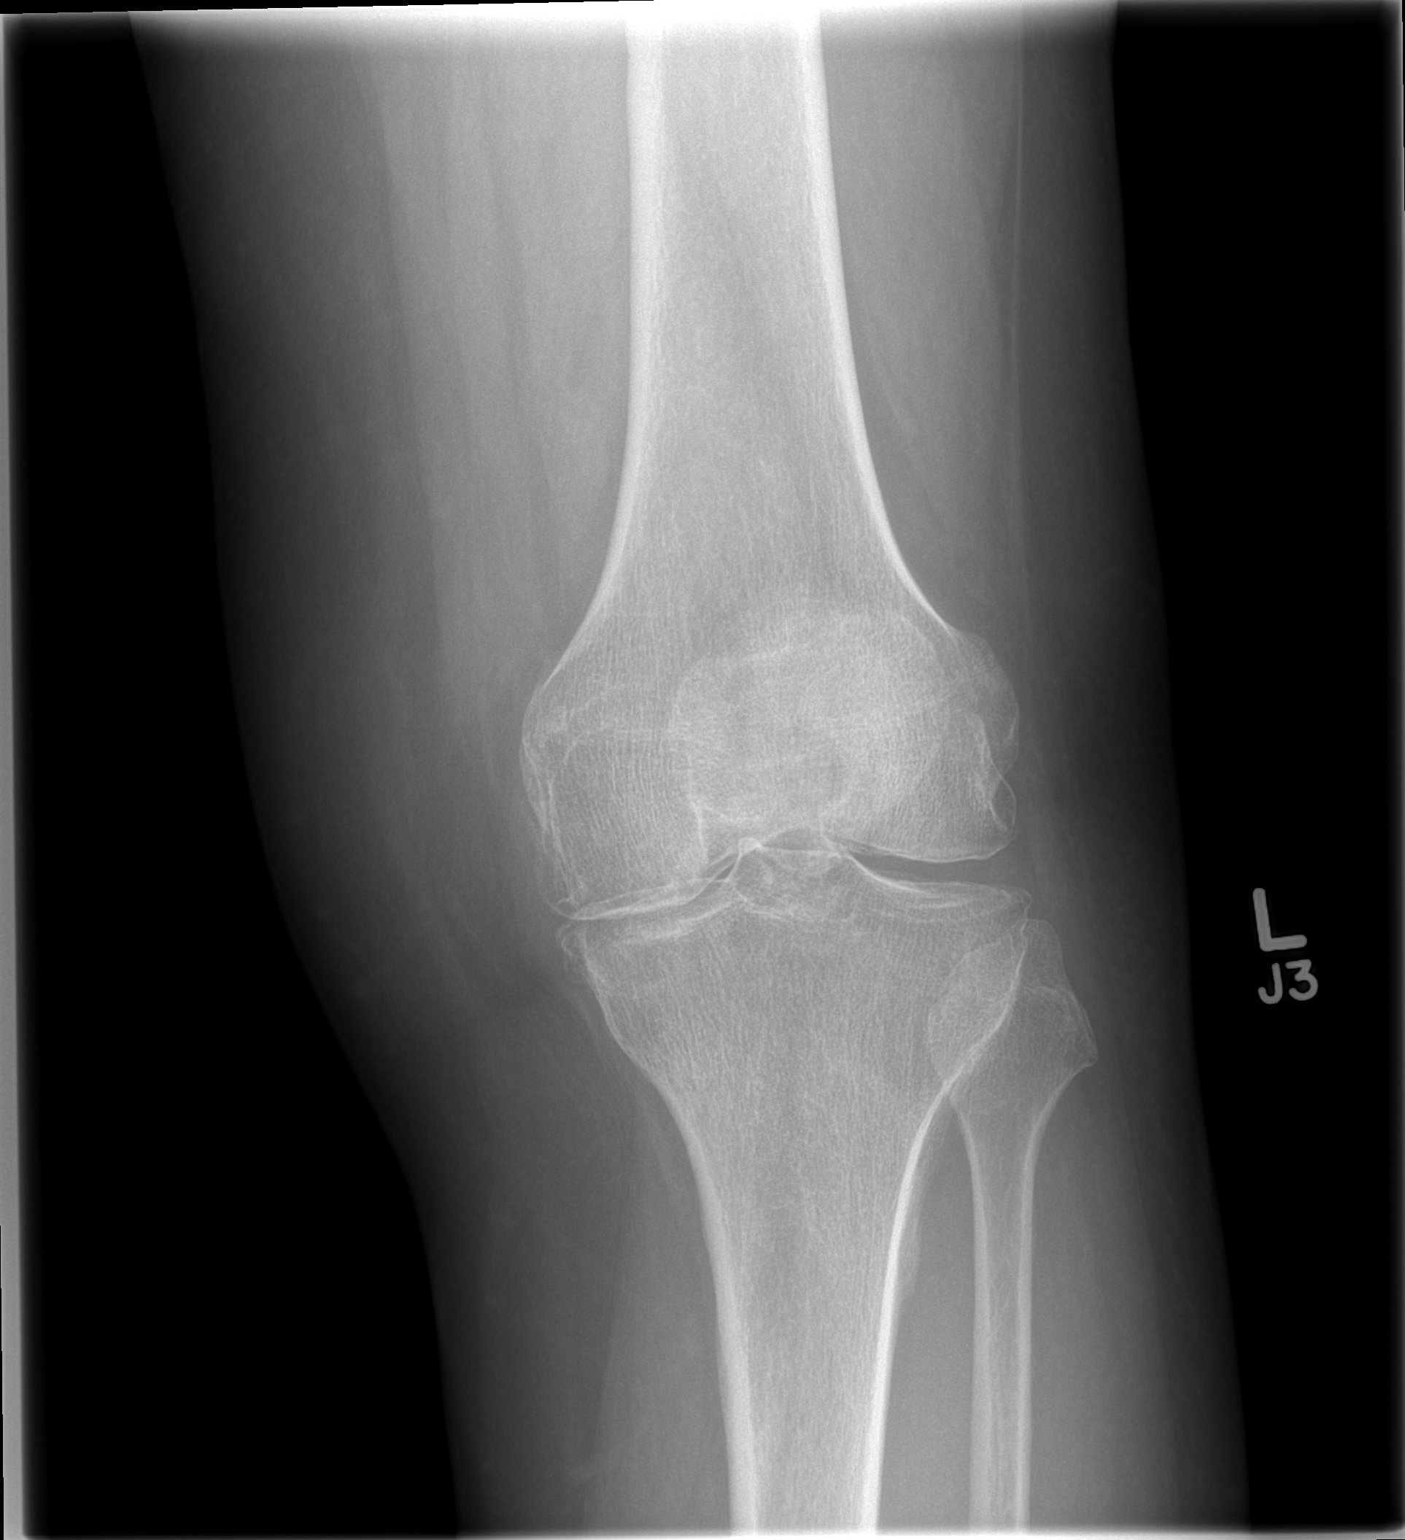

[w knee lat. left]
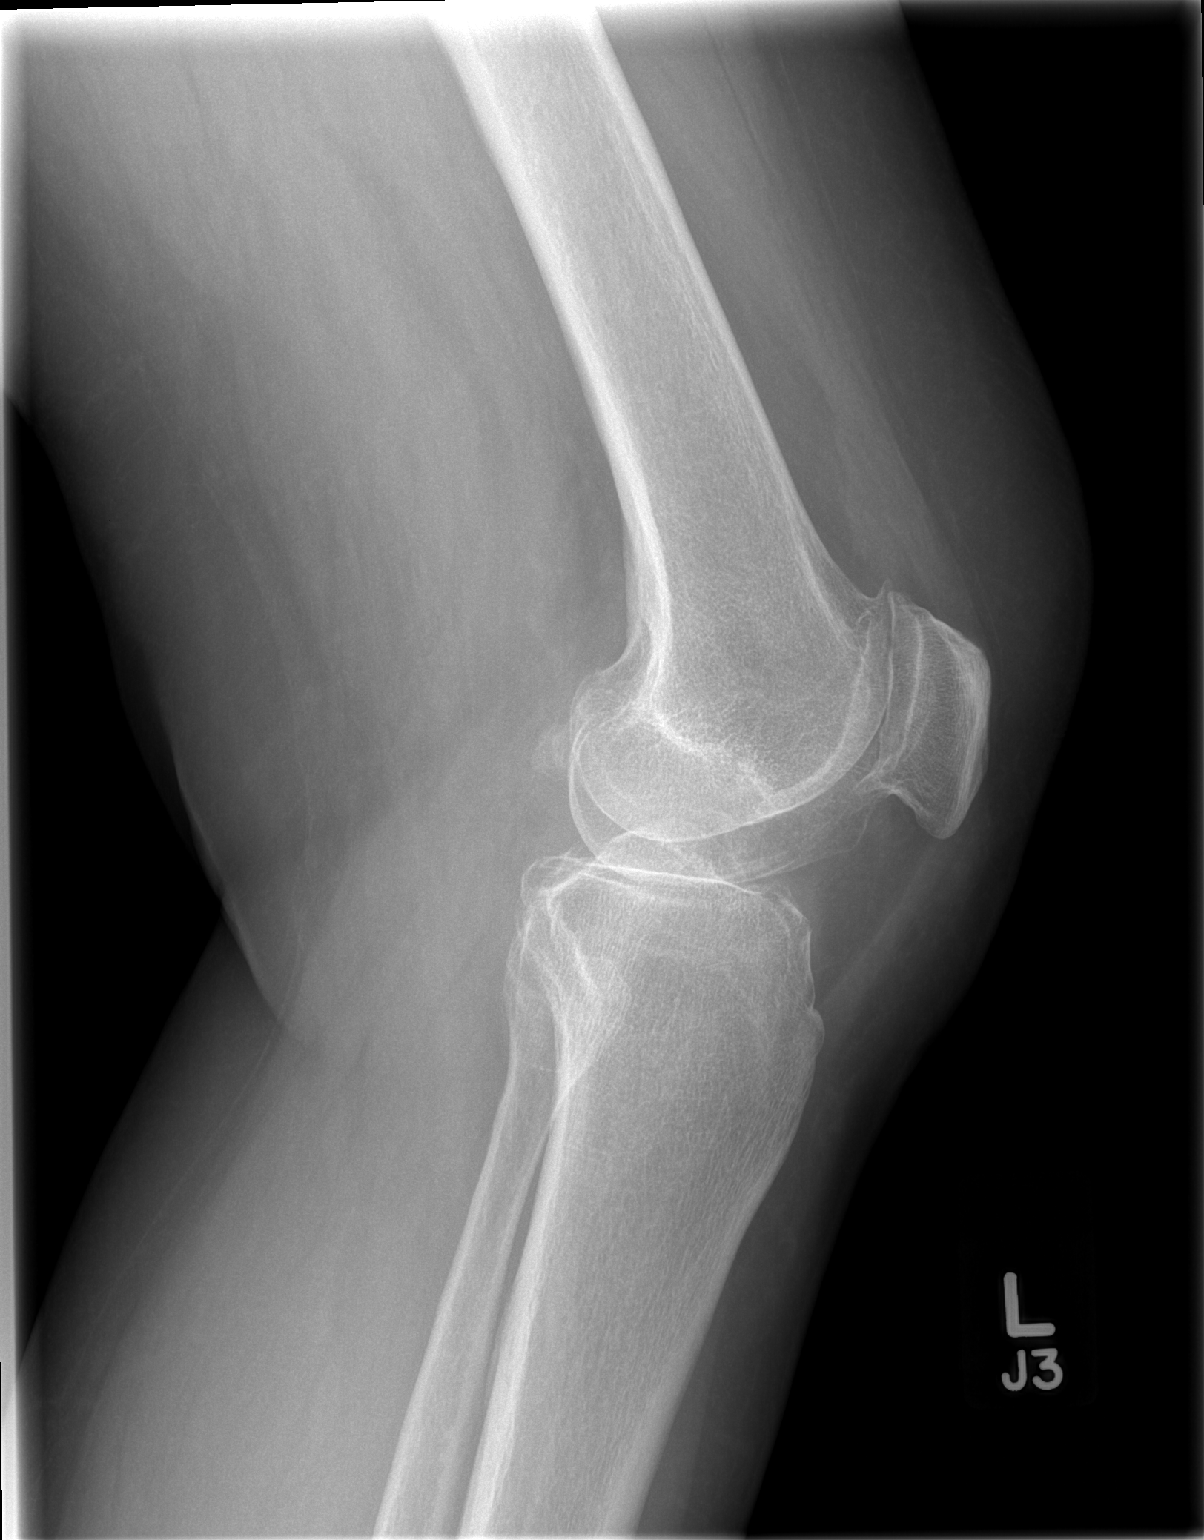

[2 of 2 positions shown; findings below may reference images not displayed]

FINDINGS: The bones of the knee are adequately mineralized. There is mild
narrowing of the medial joint compartment. There is beaking of the
tibial spines. There is a small spur from the periphery of the
medial tibial plateau. There are moderate-sized spurs arising from
the superior and inferior articular margins of the patella. There is
no joint effusion.
IMPRESSION: There is mild narrowing of the medial joint compartment and spurring
involving the patellofemoral compartment consistent with
osteoarthritis.

## 2016-09-16 ENCOUNTER — Encounter: Payer: BLUE CROSS/BLUE SHIELD | Admitting: Family Medicine

## 2016-10-13 ENCOUNTER — Other Ambulatory Visit: Payer: Self-pay | Admitting: Family Medicine

## 2016-10-13 NOTE — Telephone Encounter (Signed)
Looks like pt has an appointment scheduled in November so I will refill med until then

## 2016-10-13 NOTE — Telephone Encounter (Signed)
Called and left a message for pt to call back and schedule CPE and we could also put her on cancellation list for cpe since Dr. Lynelle Doctor is booked out a good ways. Once appt is scheduled we can refill med
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
## 2016-12-09 ENCOUNTER — Encounter: Payer: Self-pay | Admitting: Family Medicine

## 2016-12-09 ENCOUNTER — Ambulatory Visit (INDEPENDENT_AMBULATORY_CARE_PROVIDER_SITE_OTHER): Payer: BLUE CROSS/BLUE SHIELD | Admitting: Family Medicine

## 2016-12-09 VITALS — BP 116/68 | HR 68 | Ht 65.0 in | Wt 209.6 lb

## 2016-12-09 DIAGNOSIS — M7702 Medial epicondylitis, left elbow: Secondary | ICD-10-CM

## 2016-12-09 DIAGNOSIS — Z0000 Encounter for general adult medical examination without abnormal findings: Secondary | ICD-10-CM | POA: Diagnosis not present

## 2016-12-09 DIAGNOSIS — E039 Hypothyroidism, unspecified: Secondary | ICD-10-CM

## 2016-12-09 DIAGNOSIS — M79642 Pain in left hand: Secondary | ICD-10-CM

## 2016-12-09 DIAGNOSIS — Z5181 Encounter for therapeutic drug level monitoring: Secondary | ICD-10-CM | POA: Diagnosis not present

## 2016-12-09 DIAGNOSIS — F325 Major depressive disorder, single episode, in full remission: Secondary | ICD-10-CM | POA: Diagnosis not present

## 2016-12-09 DIAGNOSIS — E559 Vitamin D deficiency, unspecified: Secondary | ICD-10-CM

## 2016-12-09 DIAGNOSIS — I10 Essential (primary) hypertension: Secondary | ICD-10-CM

## 2016-12-09 DIAGNOSIS — Z23 Encounter for immunization: Secondary | ICD-10-CM | POA: Diagnosis not present

## 2016-12-09 DIAGNOSIS — M79641 Pain in right hand: Secondary | ICD-10-CM

## 2016-12-09 DIAGNOSIS — M7701 Medial epicondylitis, right elbow: Secondary | ICD-10-CM

## 2016-12-09 DIAGNOSIS — B351 Tinea unguium: Secondary | ICD-10-CM

## 2016-12-09 LAB — COMPREHENSIVE METABOLIC PANEL
AG RATIO: 1.5 (calc) (ref 1.0–2.5)
ALBUMIN MSPROF: 4.2 g/dL (ref 3.6–5.1)
ALT: 12 U/L (ref 6–29)
AST: 13 U/L (ref 10–35)
Alkaline phosphatase (APISO): 94 U/L (ref 33–130)
BILIRUBIN TOTAL: 0.4 mg/dL (ref 0.2–1.2)
BUN: 12 mg/dL (ref 7–25)
CALCIUM: 9.3 mg/dL (ref 8.6–10.4)
CO2: 27 mmol/L (ref 20–32)
Chloride: 102 mmol/L (ref 98–110)
Creat: 0.81 mg/dL (ref 0.50–1.05)
GLUCOSE: 90 mg/dL (ref 65–99)
Globulin: 2.8 g/dL (calc) (ref 1.9–3.7)
POTASSIUM: 3.6 mmol/L (ref 3.5–5.3)
SODIUM: 137 mmol/L (ref 135–146)
TOTAL PROTEIN: 7 g/dL (ref 6.1–8.1)

## 2016-12-09 LAB — POCT URINALYSIS DIP (PROADVANTAGE DEVICE)
BILIRUBIN UA: NEGATIVE mg/dL
Bilirubin, UA: NEGATIVE
Blood, UA: NEGATIVE
Glucose, UA: NEGATIVE mg/dL
LEUKOCYTES UA: NEGATIVE
NITRITE UA: NEGATIVE
PH UA: 6 (ref 5.0–8.0)
PROTEIN UA: NEGATIVE mg/dL
Specific Gravity, Urine: 1.025
UUROB: NEGATIVE

## 2016-12-09 LAB — TSH: TSH: 1.18 m[IU]/L (ref 0.40–4.50)

## 2016-12-09 LAB — EXTRA LAV TOP TUBE

## 2016-12-09 MED ORDER — BUPROPION HCL ER (XL) 150 MG PO TB24: 150.0000 mg | ORAL_TABLET | Freq: Every day | ORAL | 3 refills | Status: DC

## 2016-12-09 NOTE — Patient Instructions (Addendum)
HEALTH MAINTENANCE RECOMMENDATIONS:  It is recommended that you get at least 30 minutes of aerobic exercise at least 5 days/week (for weight loss, you may need as much as 60-90 minutes). This can be any activity that gets your heart rate up. This can be divided in 10-15 minute intervals if needed, but try and build up your endurance at least once a week.  Weight bearing exercise is also recommended twice weekly.  Eat a healthy diet with lots of vegetables, fruits and fiber.  "Colorful" foods have a lot of vitamins (ie green vegetables, tomatoes, red peppers, etc).  Limit sweet tea, regular sodas and alcoholic beverages, all of which has a lot of calories and sugar.  Up to 1 alcoholic drink daily may be beneficial for women (unless trying to lose weight, watch sugars).  Drink a lot of water.  Calcium recommendations are 1200-1500 mg daily (1500 mg for postmenopausal women or women without ovaries), and vitamin D 1000 IU daily.  This should be obtained from diet and/or supplements (vitamins), and calcium should not be taken all at once, but in divided doses.  Monthly self breast exams and yearly mammograms for women over the age of 10840 is recommended.  Sunscreen of at least SPF 30 should be used on all sun-exposed parts of the skin when outside between the hours of 10 am and 4 pm (not just when at beach or pool, but even with exercise, golf, tennis, and yard work!)  Use a sunscreen that says "broad spectrum" so it covers both UVA and UVB rays, and make sure to reapply every 1-2 hours.  Remember to change the batteries in your smoke detectors when changing your clock times in the spring and fall. I recommend getting a carbon monoxide detector for your home.  Use your seat belt every time you are in a car, and please drive safely and not be distracted with cell phones and texting while driving.  I recommend getting the new shingles vaccine (Shingrix). You will need to check with your insurance to see if  it is covered, and if covered by Medicare Part D, you need to get from the pharmacy rather than our office.  It is a series of 2 injections, spaced 2 months apart.  Try and wean off of sweet tea, or use unsweet tea with Stevia. Try and drink non-caloric beverages. Increase your fruits/vegetables and decrease your carbs in your diet.  Schedule dental visit.  You can try the lower dose of wellbutrin to see how this works for you.  You have another month or so of 300mg .  Once you finish this, try the 150mg  dose. If you notice any recurrent depressive symptoms returning, please go back up to 300mg  (by taking two pills together, and calling us to change the dose back to 300mg .).  If after 1-2 months (or longer) on the 150mg  dose you are still doing great without any increased irritability, depression, anxiety or other problems, you can try stopping the Wellbutrin entirely.  If you notice any residual symptoms on the 150mg , you can choose to increase back to 300mg  vs stay on the 150mg  long-term.  Don't stop it if you have any symptoms ongoing.    Golfer's Elbow Golfer's elbow, also called medial epicondylitis, is a condition that results from inflammation of the strong bands of tissue (tendons) that attach your forearm muscles to the inside of your bone at the elbow. These tendons affect the muscles that bend the palm toward the wrist (flexion).  This condition is called golfer's elbow because it is more common among people who constantly bend and twist their wrists, such as golfers. This injury usually results from overuse. Tendons also become less flexible with age. This condition causes elbow pain that may spread to your forearm and upper arm. The pain may get worse when you bend your wrist downward. What are the causes? This condition is an overuse injury that is caused by:  Repeatedly flexing, turning, or twisting your wrist.  Constantly gripping objects with your hands.  What increases the  risk? This condition is more likely to develop in people who play golf or tennis or have jobs that require the constant use of their hands. This injury is more common among:  Carpenters.  Gardeners.  Musicians.  Bricklayers.  Typists.  What are the signs or symptoms? Symptoms of this condition include:  Pain near the inner elbow or forearm.  Reduced grip strength.  How is this diagnosed? This condition is diagnosed based on your symptoms, medical history, and physical exam. During the exam, your health care provider may test your grip strength and move your wrist to check for pain. You may also have an MRI to confirm the diagnosis, look for other issues, and check for tears in the ligaments, muscles, or tendons. How is this treated? Treatment for this condition includes:  Stopping all activities that make you bend or twist your wrist until your pain and other symptoms go away.  Icing your wrist to relieve pain.  Taking NSAIDs or getting corticosteroid injections to reduce pain and swelling.  Doing stretches, range-of-motion, and strengthening exercises (physical therapy) as told by your health care provider.  In rare cases, surgery may be needed if your condition does not improve. Follow these instructions at home:  If directed, apply ice to the injured area. ? Put ice in a plastic bag. ? Place a towel between your skin and the bag. ? Leave the ice on for 20 minutes, 2-3 times a day.  Move your fingers often to avoid stiffness.  Raise (elevate) the injured area above the level of your heart while you are sitting or lying down.  Return to your normal activities as told by your health care provider. Ask your health care provider what activities are safe for you.  Do exercises as told by your health care provider.  Do not use tobacco products, including cigarettes, chewing tobacco, or e-cigarettes. If you need help quitting, ask your health care provider.  Take  over-the-counter and prescription medicines only as told by your health care provider.  Keep all follow-up visits as told by your health care provider. This is important. How is this prevented?  Warm up and stretch before being active.  Cool down and stretch after being active.  Give your body time to rest between periods of activity.  Make sure to use equipment that fits you.  Be safe and responsible while being active to avoid falls.  Do at least 150 minutes of moderate-intensity exercise each week, such as brisk walking or water aerobics.  Maintain physical fitness, including: ? Strength. ? Flexibility. ? Cardiovascular fitness. ? Endurance.  Perform exercises to strengthen the forearm muscles.  Slow your golf swing to reduce shock in the arm when making contact with the ball, if you play golf. Contact a health care provider if:  Your pain does not improve or it gets worse.  You notice numbness in your hand. Get help right away if:  Your pain  is severe.  You cannot move your wrist. This information is not intended to replace advice given to you by your health care provider. Make sure you discuss any questions you have with your health care provider. Document Released: 01/05/2005 Document Revised: 09/10/2015 Document Reviewed: 09/17/2014 Elsevier Interactive Patient Education  [employer]2018 [employer]

## 2016-12-10 ENCOUNTER — Encounter: Payer: Self-pay | Admitting: Family Medicine

## 2016-12-10 MED ORDER — LISINOPRIL-HYDROCHLOROTHIAZIDE 10-12.5 MG PO TABS: 1.0000 | ORAL_TABLET | Freq: Every day | ORAL | 3 refills | Status: DC

## 2016-12-10 MED ORDER — SYNTHROID 75 MCG PO TABS: 75.0000 ug | ORAL_TABLET | Freq: Every day | ORAL | 3 refills | Status: DC

## 2017-01-13 ENCOUNTER — Other Ambulatory Visit: Payer: Self-pay | Admitting: Family Medicine

## 2017-01-13 DIAGNOSIS — F325 Major depressive disorder, single episode, in full remission: Secondary | ICD-10-CM

## 2017-01-13 MED ORDER — BUPROPION HCL ER (XL) 150 MG PO TB24: 150.0000 mg | ORAL_TABLET | Freq: Every day | ORAL | 0 refills | Status: DC

## 2017-01-13 NOTE — Telephone Encounter (Signed)
Can pt have a refill on meds  

## 2017-03-15 ENCOUNTER — Encounter: Payer: Self-pay | Admitting: Family Medicine

## 2017-03-15 ENCOUNTER — Ambulatory Visit: Payer: BLUE CROSS/BLUE SHIELD | Admitting: Family Medicine

## 2017-03-15 VITALS — BP 122/72 | HR 84 | Temp 98.9°F | Ht 65.0 in | Wt 208.8 lb

## 2017-03-15 DIAGNOSIS — R059 Cough, unspecified: Secondary | ICD-10-CM

## 2017-03-15 DIAGNOSIS — R05 Cough: Secondary | ICD-10-CM | POA: Diagnosis not present

## 2017-03-15 DIAGNOSIS — J069 Acute upper respiratory infection, unspecified: Secondary | ICD-10-CM | POA: Diagnosis not present

## 2017-03-15 MED ORDER — BENZONATATE 200 MG PO CAPS: 200.0000 mg | ORAL_CAPSULE | Freq: Three times a day (TID) | ORAL | 0 refills | Status: DC | PRN

## 2017-03-15 NOTE — Progress Notes (Signed)
Chief Complaint  Patient presents with  . Sore Throat    x 9 days, fatigue, congestion, HA, feels like she can't hear. Says she feels warm but hasn't taken her temp. Has had some body aches and chills.    9 days ago she started with sneezing, coughing, body aches and a bad sore throat (at night). She had some low grade fevers. She stayed home Tues/Wed last week with body aches and fatigue, cough, decreased hearing. She felt a little better Thursday, went to work, and on Friday she felt worse again.  Yesterday she was coughing, sneezing, eyes running, chest hurting. Ears remain plugged bilaterally, sometimes popping. Denies sinus headaches, but has a headache from coughing. Nasal drainage is somewhat yellowish.  Cough is both hacky, and sometimes productive, also yellowish. She was a little worse yesterday.  She has been taking alka selzer cold plus nighttime, used it during the day only when home from work, makes her tired.  She has used some sudafed when at work. She has taken xicam chews.  She has taken an off-brand robitussin type cough medication (cough and congestion).    +sick grandchildren (URI's)  PMH, PSH, SH reviewed  Outpatient Encounter Medications as of 03/15/2017  Medication Sig  . aspirin 81 MG tablet Take 81 mg by mouth daily.  . Biotin 1000 MCG tablet Take 1,000 mcg by mouth daily.  Marland Kitchen. buPROPion (WELLBUTRIN XL) 150 MG 24 hr tablet Take 1 tablet (150 mg total) by mouth daily.  . Chlorphen-Phenyleph-ASA (ALKA-SELTZER PLUS COLD PO) Take 2 capsules by mouth.  . cholecalciferol (VITAMIN D) 1000 UNITS tablet Take 1,000 Units by mouth daily.  Marland Kitchen. Dextromethorphan Polistirex (DELSYM PO) Take 10 mLs by mouth.  Marland Kitchen. lisinopril-hydrochlorothiazide (PRINZIDE,ZESTORETIC) 10-12.5 MG tablet Take 1 tablet by mouth daily.  . Multiple Vitamins-Minerals (HAIR/SKIN/NAILS/BIOTIN PO) Take 1 tablet by mouth daily.  Marland Kitchen. SYNTHROID 75 MCG tablet Take 1 tablet (75 mcg total) by mouth daily.  Marland Kitchen. albuterol  (PROVENTIL HFA;VENTOLIN HFA) 108 (90 Base) MCG/ACT inhaler Inhale 2 puffs into the lungs every 6 (six) hours as needed for wheezing or shortness of breath. (Patient not taking: Reported on 01/08/2016)  . desonide (DESOWEN) 0.05 % cream Apply topically 2 (two) times daily as needed. (Patient not taking: Reported on 01/08/2016)   No facility-administered encounter medications on file as of 03/15/2017.    Allergies  Allergen Reactions  . Zinc Hives  . Latex Rash   ROS:  lowgrade fevers resolved.  Still has some chills.  No nausea, vomiting, diarrhea, bleeding, bruising, rashes, urinary symptoms.  +chest and back pain due to coughing.  +URI symptoms. +fatigue.  No shortness of breath.  PHYSICAL EXAM:  BP 122/72   Pulse 84   Temp 98.9 F (37.2 C) (Tympanic)   Ht 5\' 5"  (1.651 m)   Wt 208 lb 12.8 oz (94.7 kg)   BMI 34.75 kg/m   Well appearing, somewhat tired, female in no distress. Occasional dry cough during visit. HEENT: PERRL, EOMI, conjunctiva and sclera are clear. TM's and EAC's normal. Nasal mucosa is mild-mod edematous L>R, no erythema or purulence. Sinuses are nontender. OP shows some mild erythema posteriorly, otherwise normal. Neck: no lymphadenopathy or mass Heart: regular rate and rhythm, no murmur Lungs: clear bilateraly. No wheezes, rales, ronchi, good air movement. No coughing during deep breaths. Skin: normal turgor, no rash Neuro: alert and oriented, cranial nerves intact, normal gait Psych: normal mood, affect, hygiene and grooming   ASSESSMENT/PLAN:  Viral upper respiratory tract infection -  no bacterial infection currently, but s/sx reviewed. Supportive measures reviewed, counseled re: OTC and rx meds  Cough - Plan: benzonatate (TESSALON) 200 MG capsule   Drink plenty of water. Your ears, throat, nose and lungs appear normal, indicating that this is a viral infection. Take tylenol as needed for pain and/or fever Use sudafed during the day to treat sinus  pressure, drainage and help with the ear plugging. You may continue to use the alka selzer plus nighttime at bedtime. I would add guafenesin during the day--such as Mucinex 12 hour (plain, NOT the D or DM--verify that this ingredient isn't in your nighttime medication, and if it is, just take the mucinex in the morning). I'm prescribing benzonatate (tessalon) to take three times daily if needed for cough.  Contact us if you develop recurrent fever, worsening discolored mucus, as it may then have turned into a bacterial infection.  You can call for this antibiotic, but if you are having worsening chest pain, pain with breathing or shortness of breath, please return for re-evaluation.  Get plenty of rest.

## 2017-03-15 NOTE — Patient Instructions (Signed)
  Drink plenty of water. Your ears, throat, nose and lungs appear normal, indicating that this is a viral infection. Take tylenol as needed for pain and/or fever Use sudafed during the day to treat sinus pressure, drainage and help with the ear plugging. You may continue to use the alka selzer plus nighttime at bedtime. I would add guafenesin during the day--such as Mucinex 12 hour (plain, NOT the D or DM--verify that this ingredient isn't in your nighttime medication, and if it is, just take the mucinex in the morning). I'm prescribing benzonatate (tessalon) to take three times daily if needed for cough.  Contact us if you develop recurrent fever, worsening discolored mucus, as it may then have turned into a bacterial infection.  You can call for this antibiotic, but if you are having worsening chest pain, pain with breathing or shortness of breath, please return for re-evaluation.  Get plenty of rest.

## 2017-07-17 ENCOUNTER — Other Ambulatory Visit: Payer: Self-pay | Admitting: Family Medicine

## 2017-07-17 DIAGNOSIS — F325 Major depressive disorder, single episode, in full remission: Secondary | ICD-10-CM

## 2017-07-19 NOTE — Telephone Encounter (Signed)
[facility]

## 2017-12-12 NOTE — Progress Notes (Deleted)
[name] is a 62 y.o. female who presents for a complete physical.  She has the following concerns:  Hypothyroidism follow-up: She has been taking her thyroid medication along with all of her other medications, as she always has. She takes it an hour before food. She has not missed any doses. Denies hair/skin/nail/bowel changes. Lab Results  Component Value Date   TSH 1.18 12/09/2016    Hypertension follow-up: Blood pressures are not checked elsewhere. Denies dizziness, headaches, chest pain, muscle cramps. Denies side effects of medications.  Depression: She has been on medication since her divorce. At her visit last year, she had been doing very well, and we discussed trial of tapering down the dose from 354m of Wellbutrin.  Her daughter lives nearby (had moved out). Still picks up grandbaby 3 days/week. Only has some work stress now, same as usual. She would eventually like to try and get off the wellbutrin.  Last year the dose was changed to 1563mso that she could attempt to decrease the dose after the holidays. UPDATE  Vitamin D deficiency: Vitamin D level was last checked in 12/2015 normal at 34.  She continues to take 1000 IU of Vitamin D daily.  Obesity--3 glasses of sweet tea daily   Immunization History  Administered Date(s) Administered  . Influenza Split 10/12/2011  . Influenza,inj,Quad PF,6+ Mos 01/09/2013, 02/07/2014, 09/03/2014, 01/08/2016, 12/09/2016  . Tdap 09/19/2008   Last Pap smear: 2009; s/p hysterectomy Last mammogram: 07/2016 Last colonoscopy: 12/2007 Last DEXA: never Dentist: once yearly, admits to be long past due. Ophtho: once yearly, wears contacts Exercise: yardwork 3x/week (self-propelled mower), planting flowers. No other regular exercise.  Lipids: Lab Results  Component Value Date   CHOL 167 01/08/2016   HDL 52 01/08/2016   LDLCALC 96 01/08/2016   TRIG 95 01/08/2016   CHOLHDL 3.2 01/08/2016     ROS: The patient denies  anorexia, fever, headaches, vision changes, decreased hearing, ear pain, sore throat, breast concerns, chest pain, palpitations, dizziness, syncope, dyspnea on exertion, cough, swelling, nausea, vomiting, diarrhea, constipation, abdominal pain, melena, hematochezia, indigestion/heartburn, hematuria, incontinence, dysuria, vaginal bleeding, discharge, odor or itch, genital lesions, numbness, tingling, weakness, tremor, suspicious skin lesions, depression, anxiety, abnormal bleeding/bruising, or enlarged lymph nodes. Slight intermittent vaginal itch (unchanged). Vagisil helps some.  Left knee doesn't bend the same as the right, but not painful, and doesn't want further treatment Onychomycosis--remains asymptomatic   PHYSICAL EXAM:  Wt Readings from Last 3 Encounters:  03/15/17 208 lb 12.8 oz (94.7 kg)  12/09/16 209 lb 9.6 oz (95.1 kg)  01/08/16 201 lb 3.2 oz (91.3 kg)    General Appearance:   Alert, cooperative, no distress, appears stated age  Head:   Normocephalic, without obvious abnormality, atraumatic  Eyes:   PERRL, conjunctiva/corneas clear, EOM's intact, fundi benign  Ears:   Normal TM's and external ear canals  Nose:  Nares normal, mucosa mildly edematous with clear mucus, no sinustenderness  Throat:  Lips, mucosa, and tongue normal; teeth and gums normal.   Neck:  Supple, no lymphadenopathy; thyroid: noenlargement/ tenderness/nodules; no carotid bruit or JVD  Back:  Spine nontender, no curvature, ROM normal, no CVA tenderness  Lungs:   Clear to auscultation bilaterally without wheezes, rales or ronchi; respirations unlabored  Chest Wall:   No tenderness or deformity  Heart:   Regular rate and rhythm, S1 and S2 normal, no murmur, rub  or gallop  Breast Exam:   No tenderness, masses, or nipple discharge or inversion.No axillary lymphadenopathy  Abdomen:   Soft, non-tender, nondistended, normoactive bowel sounds,   no masses,  no hepatosplenomegaly  Genitalia:   External genitalia normal.  BUS and vagina normal; uterus is surgically absent; adnexa not enlarged, nontender, no masses. Pap not performed  Rectal:   Normal tone, no masses or tenderness; guaiac negative stool  Extremities:  No clubbing, cyanosis or edema. Thickened, discolored right 2nd toenail, and central thickening of the left great toenail. No ingrowing nails, nontender. Slight discoloration of both 5th toenails, not thickened. Mild arthritic change/deformity starting at left index finger.  Pulses:  2+ and symmetric all extremities  Skin:  Skin color, texture, turgor normal, no rashes or lesions  Lymph nodes:  Cervical, supraclavicular, and axillary nodes normal  Neurologic:  CNII-XII intact, normal strength, sensation and gait; reflexes 2+ and symmetric throughout   Psych: Normal mood, affect, hygiene and grooming  Update toes/extrems   ASSESSMENT/PLAN:    Flu shot Tdap next year Shingrix rec  C-met, CBC, TSH  Colon cancer screening due--colonoscopy vs Cologuard  Discussed monthly self breast exams and yearly mammograms; at least 30 minutes of aerobic activity at least 5 days/week, weight-bearing exercise at least 2x/week (upper and lower body); proper sunscreen use reviewed; healthy diet, including goals of calcium and vitamin D intake and alcohol recommendations (less than or equal to 1 drink/day) reviewed; regular seatbelt use; changing batteries in smoke detectors. Immunization recommendations discussed--flu shot given today.Shingrix recommended and discussed; return for [place] if covered by insurance. Colonoscopy recommendations reviewed--due next month

## 2017-12-15 ENCOUNTER — Encounter: Payer: BLUE CROSS/BLUE SHIELD | Admitting: Family Medicine

## 2017-12-29 ENCOUNTER — Other Ambulatory Visit: Payer: Self-pay | Admitting: Family Medicine

## 2017-12-29 DIAGNOSIS — E039 Hypothyroidism, unspecified: Secondary | ICD-10-CM

## 2018-01-13 ENCOUNTER — Other Ambulatory Visit: Payer: Self-pay | Admitting: Family Medicine

## 2018-01-13 DIAGNOSIS — I10 Essential (primary) hypertension: Secondary | ICD-10-CM

## 2018-01-18 ENCOUNTER — Other Ambulatory Visit: Payer: Self-pay | Admitting: Family Medicine

## 2018-01-18 DIAGNOSIS — F325 Major depressive disorder, single episode, in full remission: Secondary | ICD-10-CM

## 2018-01-18 NOTE — Telephone Encounter (Signed)
She cancelled her November physical, isn't scheduled until the end of April.  I sent in refill for the Wellbutrin, but given her HTN, thyroid, etc, and last labs being done 11/2016, she shouldn't wait until the end of April for a visit . She needs a med check in January, if unable to get in for CPE off cancellation list.

## 2018-01-18 NOTE — Telephone Encounter (Signed)
[facility] to [facility]. Please advise [facility] 

## 2018-02-15 NOTE — Progress Notes (Signed)
Chief Complaint  Patient presents with  . Hypertension    fasting med check. Has not taken bupropion in about 3 weeks and cannot really tell any difference.  Marland Kitchen other    would like to know if she can switch to generic synthroid.    Patient presents for med check.  She cancelled her physical in November is rescheduled for April, but presents for med check, as she is due for labs and medication refills.  Hypothyroidism follow-up: She has been taking her thyroid medication along with all of her other medications, as she always has. She takes it an hour before food. She has not missed any doses. Denies hair/skin/nail/bowel changes. Skin has been a little dry with the cold weather, slight rash from her watch. Lab Results  Component Value Date   TSH 1.18 12/09/2016   She is asking about generic vs brand thyroid med (costing her $99/90d supply for brand).  Hypertension follow-up: Blood pressures are not checked elsewhere. Denies dizziness, headaches, chest pain, muscle cramps, edema. Denies side effects of medications.  Depression: She was put on medication since her divorce. She has never tried to come off. At her last physical last year, she had been doing well, so dose was decreased to 157m after the holidays. She stopped taking it about 3 weeks ago (did not pick up the last prescription refill). So far is doing well, without any recurrent depressive symptoms.  Vitamin D deficiency: Vitamin D level was last checked in 12/2015 normal at 34,when taking 1000 IU daily.  She has been taking 5000 IU for at least the last 6 months.   Obesity--She continues to drink 1-2 glasses of sweet tea daily (cut back from 3). Only activity is taking care of her grandchildren.   PMH, PSH, SH reviewed  Outpatient Encounter Medications as of 02/16/2018  Medication Sig Note  . aspirin 81 MG tablet Take 81 mg by mouth daily.   . Cholecalciferol (VITAMIN D3) 125 MCG (5000 UT) CAPS Take 1 capsule by  mouth.   .Marland Kitchenlisinopril-hydrochlorothiazide (PRINZIDE,ZESTORETIC) 10-12.5 MG tablet TAKE 1 TABLET BY MOUTH EVERY DAY   . SYNTHROID 75 MCG tablet TAKE 1 TABLET BY MOUTH EVERY DAY   . albuterol (PROVENTIL HFA;VENTOLIN HFA) 108 (90 Base) MCG/ACT inhaler Inhale 2 puffs into the lungs every 6 (six) hours as needed for wheezing or shortness of breath. (Patient not taking: Reported on 01/08/2016)   . buPROPion (WELLBUTRIN XL) 150 MG 24 hr tablet TAKE 1 TABLET BY MOUTH EVERY DAY (Patient not taking: Reported on 02/16/2018) 02/16/2018: Stopped 3 weeks ago  . Chlorphen-Phenyleph-ASA (ALKA-SELTZER PLUS COLD PO) Take 2 capsules by mouth.   . desonide (DESOWEN) 0.05 % cream Apply topically 2 (two) times daily as needed. (Patient not taking: Reported on 01/08/2016)   . [DISCONTINUED] benzonatate (TESSALON) 200 MG capsule Take 1 capsule (200 mg total) by mouth 3 (three) times daily as needed for cough.   . [DISCONTINUED] Biotin 1000 MCG tablet Take 1,000 mcg by mouth daily.   . [DISCONTINUED] cholecalciferol (VITAMIN D) 1000 UNITS tablet Take 1,000 Units by mouth daily.   . [DISCONTINUED] Dextromethorphan Polistirex (DELSYM PO) Take 10 mLs by mouth.   . [DISCONTINUED] Multiple Vitamins-Minerals (HAIR/SKIN/NAILS/BIOTIN PO) Take 1 tablet by mouth daily.    No facility-administered encounter medications on file as of 02/16/2018.    Allergies  Allergen Reactions  . Zinc Hives  . Latex Rash   ROS: no fever, chills, headaches, dizziness, chest pain, palpitations, weight change, GI or GU  complaints, bleeding, bruising, mood changes or other concerns.  Some rash at her left wrist due to her watch, and an area of redness at her left elbow (antecubital fossa)--reports it was very red this morning, looking better now. See HPI   PHYSICAL EXAM:  BP 110/70   Pulse 76   Ht 5' 5"  (1.651 m)   Wt 214 lb 6.4 oz (97.3 kg)   BMI 35.68 kg/m   Wt Readings from Last 3 Encounters:  02/16/18 214 lb 6.4 oz (97.3 kg)  03/15/17  208 lb 12.8 oz (94.7 kg)  12/09/16 209 lb 9.6 oz (95.1 kg)   Well appearing, pleasant, obese female in no distress HEENT: conjunctiva and sclera are clear, EOMI.  OP is clear. Sinuses nontender Neck: no lymphadenopathy, thyromegaly or mass, no carotid bruit Heart: regular rate and rhythm, no murmur Lungs: clear bilaterally Back: no CVA or spinal tenderness Abdomen: soft, nontender, no mass Extremities: no edema Skin: Irritation with slight excoriation at left anterior wrist (from watch); area of very slight erythema at antecubital fossa on left--not thickened or dry, no true rash Neuro: alert and oriented, cranial nerves intact, normal gait Psych: normal mood, affect, hygiene and grooming  ASSESSMENT/PLAN:  Hypothyroidism, unspecified type - Plan: TSH  Essential hypertension, benign - well controlled - Plan: Comprehensive metabolic panel, Lipid panel  Major depression in remission (HCC) - stay off Wellbutrin, look for recurrent sx of depression (and restart at 150 mg dose if recurs)  Vitamin D deficiency - recheck level since on much higher dose now (5000 IU) - Plan: VITAMIN D 25 Hydroxy (Vit-D Deficiency, Fractures)  Medication monitoring encounter - Plan: CBC with Differential/Platelet, VITAMIN D 25 Hydroxy (Vit-D Deficiency, Fractures), TSH, Comprehensive metabolic panel  Need for influenza vaccination - Plan: Flu Vaccine QUAD 6+ mos PF IM (Fluarix Quad PF)  Class 2 severe obesity due to excess calories with serious comorbidity and body mass index (BMI) of 35.0 to 35.9 in adult [facility]) - counseled re: risks of obesity, diet, exercise, weight loss  Flu shot given, encouraged to get earlier in the season next year. To check her insurance coverage for Shingrix, recommended Will give tetanus booster at her physical in April  c-met, CBC, TSH, D, Lipids (is fasting)  Discussed generic vs branded synthroid in detail, encouraged branded if affordable. Discussed that [place] mail  order is a little less expensive ($22-25/month)   F/u as scheduled for CPE in April  Stay off the Wellbutrin. If you find that your moods are worsening in the next month or two, you have a refill at the pharmacy to restart the 157m.

## 2018-02-16 ENCOUNTER — Encounter: Payer: Self-pay | Admitting: Family Medicine

## 2018-02-16 ENCOUNTER — Ambulatory Visit: Payer: BLUE CROSS/BLUE SHIELD | Admitting: Family Medicine

## 2018-02-16 VITALS — BP 110/70 | HR 76 | Ht 65.0 in | Wt 214.4 lb

## 2018-02-16 DIAGNOSIS — E559 Vitamin D deficiency, unspecified: Secondary | ICD-10-CM

## 2018-02-16 DIAGNOSIS — Z5181 Encounter for therapeutic drug level monitoring: Secondary | ICD-10-CM

## 2018-02-16 DIAGNOSIS — Z23 Encounter for immunization: Secondary | ICD-10-CM | POA: Diagnosis not present

## 2018-02-16 DIAGNOSIS — F325 Major depressive disorder, single episode, in full remission: Secondary | ICD-10-CM

## 2018-02-16 DIAGNOSIS — E66812 Obesity, class 2: Secondary | ICD-10-CM

## 2018-02-16 DIAGNOSIS — I10 Essential (primary) hypertension: Secondary | ICD-10-CM

## 2018-02-16 DIAGNOSIS — E039 Hypothyroidism, unspecified: Secondary | ICD-10-CM | POA: Diagnosis not present

## 2018-02-16 DIAGNOSIS — Z6835 Body mass index (BMI) 35.0-35.9, adult: Secondary | ICD-10-CM

## 2018-02-16 NOTE — Patient Instructions (Signed)
Try and get flu shot earlier in the season (September/October) in the future. You were given one today.  It is recommended that you get at least 30 minutes of aerobic exercise at least 5 days/week (for weight loss, you may need as much as 60-90 minutes). This can be any activity that gets your heart rate up. This can be divided in 10-15 minute intervals if needed, but try and build up your endurance at least once a week.  Weight bearing exercise is also recommended twice weekly.  Please work on cutting back on the sweet tea, drinking more water.  Limit portions, healthy food choices in order to lose weight.

## 2018-02-17 ENCOUNTER — Encounter: Payer: Self-pay | Admitting: Family Medicine

## 2018-02-17 LAB — LIPID PANEL
CHOL/HDL RATIO: 3.1 ratio (ref 0.0–4.4)
Cholesterol, Total: 174 mg/dL (ref 100–199)
HDL: 56 mg/dL (ref >39)
LDL Calculated: 96 mg/dL (ref 0–99)
Triglycerides: 110 mg/dL (ref 0–149)
VLDL CHOLESTEROL CAL: 22 mg/dL (ref 5–40)

## 2018-02-17 LAB — CBC WITH DIFFERENTIAL/PLATELET
BASOS: 2 %
Basophils Absolute: 0.1 10*3/uL (ref 0.0–0.2)
EOS (ABSOLUTE): 0 10*3/uL (ref 0.0–0.4)
Eos: 0 %
Hematocrit: 39 % (ref 34.0–46.6)
Hemoglobin: 13.2 g/dL (ref 11.1–15.9)
Immature Grans (Abs): 0 10*3/uL (ref 0.0–0.1)
Immature Granulocytes: 0 %
Lymphocytes Absolute: 2.3 10*3/uL (ref 0.7–3.1)
Lymphs: 30 %
MCH: 29.4 pg (ref 26.6–33.0)
MCHC: 33.8 g/dL (ref 31.5–35.7)
MCV: 87 fL (ref 79–97)
MONOS ABS: 0.7 10*3/uL (ref 0.1–0.9)
Monocytes: 9 %
NEUTROS ABS: 4.7 10*3/uL (ref 1.4–7.0)
Neutrophils: 59 %
PLATELETS: 206 10*3/uL (ref 150–450)
RBC: 4.49 x10E6/uL (ref 3.77–5.28)
RDW: 13.5 % (ref 11.7–15.4)
WBC: 7.8 10*3/uL (ref 3.4–10.8)

## 2018-02-17 LAB — COMPREHENSIVE METABOLIC PANEL
ALBUMIN: 4.6 g/dL (ref 3.8–4.9)
ALK PHOS: 102 IU/L (ref 39–117)
ALT: 14 IU/L (ref 0–32)
AST: 14 IU/L (ref 0–40)
Albumin/Globulin Ratio: 1.7 (ref 1.2–2.2)
BILIRUBIN TOTAL: 0.3 mg/dL (ref 0.0–1.2)
BUN/Creatinine Ratio: 13 (ref 12–28)
BUN: 11 mg/dL (ref 8–27)
CHLORIDE: 99 mmol/L (ref 96–106)
CO2: 22 mmol/L (ref 20–29)
Calcium: 10.2 mg/dL (ref 8.7–10.3)
Creatinine, Ser: 0.84 mg/dL (ref 0.57–1.00)
GFR calc Af Amer: 87 mL/min/{1.73_m2} (ref >59)
GFR, EST NON AFRICAN AMERICAN: 76 mL/min/{1.73_m2} (ref >59)
Globulin, Total: 2.7 g/dL (ref 1.5–4.5)
Glucose: 90 mg/dL (ref 65–99)
Potassium: 4.4 mmol/L (ref 3.5–5.2)
Sodium: 137 mmol/L (ref 134–144)
Total Protein: 7.3 g/dL (ref 6.0–8.5)

## 2018-02-17 LAB — TSH: TSH: 0.782 u[IU]/mL (ref 0.450–4.500)

## 2018-02-17 LAB — VITAMIN D 25 HYDROXY (VIT D DEFICIENCY, FRACTURES): Vit D, 25-Hydroxy: 50.4 ng/mL (ref 30.0–100.0)

## 2018-03-25 ENCOUNTER — Other Ambulatory Visit: Payer: Self-pay | Admitting: Family Medicine

## 2018-03-25 DIAGNOSIS — E039 Hypothyroidism, unspecified: Secondary | ICD-10-CM

## 2018-04-11 ENCOUNTER — Other Ambulatory Visit: Payer: Self-pay | Admitting: Family Medicine

## 2018-04-11 DIAGNOSIS — I10 Essential (primary) hypertension: Secondary | ICD-10-CM

## 2018-04-28 ENCOUNTER — Other Ambulatory Visit: Payer: Self-pay | Admitting: Family Medicine

## 2018-05-12 ENCOUNTER — Encounter: Payer: BLUE CROSS/BLUE SHIELD | Admitting: Family Medicine

## 2018-06-12 ENCOUNTER — Other Ambulatory Visit: Payer: Self-pay | Admitting: Family Medicine

## 2018-06-12 DIAGNOSIS — I10 Essential (primary) hypertension: Secondary | ICD-10-CM

## 2018-06-25 ENCOUNTER — Other Ambulatory Visit: Payer: Self-pay | Admitting: Family Medicine

## 2018-06-25 DIAGNOSIS — E039 Hypothyroidism, unspecified: Secondary | ICD-10-CM

## 2018-06-27 NOTE — Telephone Encounter (Signed)
Did someone call you to suggest this? It appears that she is due for a routine refill (last filled x 3 months in March).  Okay to refill until her September visit. Wouldn't make sense for insurance not to cover a particular dose (maybe brand vs generic).  I'm okay with sending in refill x 3 months, but you mentioned something about insurance coverage

## 2018-06-27 NOTE — Telephone Encounter (Signed)
It appears patient insurance does not cover 75 mcg. Please advise

## 2018-08-18 ENCOUNTER — Other Ambulatory Visit: Payer: Self-pay

## 2018-08-18 ENCOUNTER — Encounter: Payer: Self-pay | Admitting: Family Medicine

## 2018-08-18 ENCOUNTER — Telehealth: Payer: Self-pay | Admitting: Family Medicine

## 2018-08-18 DIAGNOSIS — R6889 Other general symptoms and signs: Secondary | ICD-10-CM | POA: Diagnosis not present

## 2018-08-18 DIAGNOSIS — Z20822 Contact with and (suspected) exposure to covid-19: Secondary | ICD-10-CM

## 2018-08-18 DIAGNOSIS — Z20828 Contact with and (suspected) exposure to other viral communicable diseases: Secondary | ICD-10-CM

## 2018-08-18 NOTE — Telephone Encounter (Signed)
Ok, thanks.

## 2018-08-18 NOTE — Telephone Encounter (Signed)
They don't. [name] has advised me of a letter that is ok to give to the patient for work

## 2018-08-18 NOTE — Telephone Encounter (Signed)
If she had known contact and has symptoms, she should be isolating/quarantining, and should get tested.  Order can be put in and pt advised of where to go.  If she needs advice on symptoms, etc, then we can do a virtual visit in the future, not needed if she isn't asking for advice, just a test.

## 2018-08-18 NOTE — Telephone Encounter (Signed)
Letter has been faxed and emailed per request of patient

## 2018-08-18 NOTE — Telephone Encounter (Signed)
Pt called and said she was around someone at work who tested positive for covid. She said the only symptoms she is having is head congestion and sore throat. She wants to know should she go ahead and get tested or just wait it out. I offered to set her up a virtual but she declined because she is working. She can be reached at [phone]

## 2018-08-18 NOTE — Telephone Encounter (Signed)
Don't they give her that info at the testing site, that she could provide for them?

## 2018-08-18 NOTE — Telephone Encounter (Signed)
Order is being placed for patient to get tested. Is it ok for patient to get a letter for work stating she needs to quarantine.  If letter is ok, I will call patient and let her know.

## 2018-08-20 LAB — NOVEL CORONAVIRUS, NAA: SARS-CoV-2, NAA: NOT DETECTED

## 2018-09-09 ENCOUNTER — Other Ambulatory Visit: Payer: Self-pay | Admitting: Family Medicine

## 2018-09-09 DIAGNOSIS — E039 Hypothyroidism, unspecified: Secondary | ICD-10-CM

## 2018-09-09 DIAGNOSIS — I10 Essential (primary) hypertension: Secondary | ICD-10-CM

## 2018-09-09 NOTE — Telephone Encounter (Signed)
Pt has an upcoming appt

## 2018-10-05 ENCOUNTER — Encounter: Payer: Self-pay | Admitting: Family Medicine

## 2018-10-24 ENCOUNTER — Telehealth: Payer: Self-pay | Admitting: Family Medicine

## 2018-10-24 NOTE — Telephone Encounter (Signed)
Pt called and states that the person in the next office over has tested positive for Covid. She is experiencing symptoms such as cough, sore throat and congestion. She is going to the testing site to be tested today. She would like a letter for her work similar to the one in her chart dated 0/30/2020. Please advise pt at [phone].

## 2018-10-25 ENCOUNTER — Other Ambulatory Visit: Payer: Self-pay

## 2018-10-25 ENCOUNTER — Encounter: Payer: Self-pay | Admitting: Family Medicine

## 2018-10-25 DIAGNOSIS — Z20828 Contact with and (suspected) exposure to other viral communicable diseases: Secondary | ICD-10-CM | POA: Diagnosis not present

## 2018-10-25 DIAGNOSIS — Z20822 Contact with and (suspected) exposure to covid-19: Secondary | ICD-10-CM

## 2018-10-25 NOTE — Telephone Encounter (Signed)
Okay for letter--but test date is today, and expected time for results is 2 days, no longer 7-10

## 2018-10-25 NOTE — Telephone Encounter (Signed)
This letter has been emailed already

## 2018-10-28 LAB — NOVEL CORONAVIRUS, NAA: SARS-CoV-2, NAA: NOT DETECTED

## 2018-12-04 ENCOUNTER — Other Ambulatory Visit: Payer: Self-pay | Admitting: Family Medicine

## 2018-12-04 DIAGNOSIS — E039 Hypothyroidism, unspecified: Secondary | ICD-10-CM

## 2018-12-09 ENCOUNTER — Other Ambulatory Visit: Payer: Self-pay

## 2018-12-09 DIAGNOSIS — Z20822 Contact with and (suspected) exposure to covid-19: Secondary | ICD-10-CM

## 2018-12-11 LAB — NOVEL CORONAVIRUS, NAA: SARS-CoV-2, NAA: NOT DETECTED

## 2018-12-14 ENCOUNTER — Other Ambulatory Visit: Payer: Self-pay | Admitting: Family Medicine

## 2018-12-14 DIAGNOSIS — I10 Essential (primary) hypertension: Secondary | ICD-10-CM

## 2018-12-28 NOTE — Patient Instructions (Addendum)
HEALTH MAINTENANCE RECOMMENDATIONS:  It is recommended that you get at least 30 minutes of aerobic exercise at least 5 days/week (for weight loss, you may need as much as 60-90 minutes). This can be any activity that gets your heart rate up. This can be divided in 10-15 minute intervals if needed, but try and build up your endurance at least once a week.  Weight bearing exercise is also recommended twice weekly.  Eat a healthy diet with lots of vegetables, fruits and fiber.  "Colorful" foods have a lot of vitamins (ie green vegetables, tomatoes, red peppers, etc).  Limit sweet tea, regular sodas and alcoholic beverages, all of which has a lot of calories and sugar.  Up to 1 alcoholic drink daily may be beneficial for women (unless trying to lose weight, watch sugars).  Drink a lot of water.  Calcium recommendations are 1200-1500 mg daily (1500 mg for postmenopausal women or women without ovaries), and vitamin D 1000 IU daily.  This should be obtained from diet and/or supplements (vitamins), and calcium should not be taken all at once, but in divided doses.  Monthly self breast exams and yearly mammograms for women over the age of 91 is recommended.  Sunscreen of at least SPF 30 should be used on all sun-exposed parts of the skin when outside between the hours of 10 am and 4 pm (not just when at beach or pool, but even with exercise, golf, tennis, and yard work!)  Use a sunscreen that says "broad spectrum" so it covers both UVA and UVB rays, and make sure to reapply every 1-2 hours.  Remember to change the batteries in your smoke detectors when changing your clock times in the spring and fall.  Use your seat belt every time you are in a car, and please drive safely and not be distracted with cell phones and texting while driving.  You are due for colonoscopy. Please contact Dr. Kenna Gilbert office to schedule.  [facility].  You were given tetanus (TdaP)  and flu shot today.  I recommend getting the new shingles vaccine (Shingrix). You will need to check with your insurance to see if it is covered, and if covered, schedule a nurse visit at our office when convenient.  It is a series of 2 injections, spaced 2 months apart.  Routine dental cleanings are recommended.   Try taking Tylenol Arthritis at bedtime to see if you sleep better, by decreasing shoulder pain.  Try using 1% hydrocortisone cream twice daily to the eyelids and around the scalp where you have the rash.  You also have thick plaque on the back of the head more consistent with seborrheic dermatitis (dandruff).  You can use a dandruff shampoo--but lather it up and leave it in place at least 10-15 minutes before washing it off (ie Selsun Blue or other dandruff shampoo).  If not getting better, consider following up with the Dr. Dorita Sciara office (dermatologist).  Be sure to drink plenty of water and stay well hydrated.  Moisturize well, and avoid prolonged hot showers.  Seborrheic Dermatitis, Adult Seborrheic dermatitis is a skin disease that causes red, scaly patches. It usually occurs on the scalp, and it is often called dandruff. The patches may appear on other parts of the body. Skin patches tend to appear where there are many oil glands in the skin. Areas of the body that are commonly affected include:  Scalp.  Skin folds of the body.  Ears.  Eyebrows.  Neck.  Face.  Armpits.  The bearded area of men's faces. The condition may come and go for no known reason, and it is often long-lasting (chronic). What are the causes? The cause of this condition is not known. What increases the risk? This condition is more likely to develop in people who:  Have certain conditions, such as: ? HIV (human immunodeficiency virus). ? AIDS (acquired immunodeficiency syndrome). ? Parkinson disease. ? Mood disorders, such as depression.  Are 38-17 years old. What are the signs or  symptoms? Symptoms of this condition include:  Thick scales on the scalp.  Redness on the face or in the armpits.  Skin that is flaky. The flakes may be white or yellow.  Skin that seems oily or dry but is not helped with moisturizers.  Itching or burning in the affected areas. How is this diagnosed? This condition is diagnosed with a medical history and physical exam. A sample of your skin may be tested (skin biopsy). You may need to see a skin specialist (dermatologist). How is this treated? There is no cure for this condition, but treatment can help to manage the symptoms. You may get treatment to remove scales, lower the risk of skin infection, and reduce swelling or itching. Treatment may include:  Creams that reduce swelling and irritation (steroids).  Creams that reduce skin yeast.  Medicated shampoo, soaps, moisturizing creams, or ointments.  Medicated moisturizing creams or ointments. Follow these instructions at home:  Apply over-the-counter and prescription medicines only as told by your health care provider.  Use any medicated shampoo, soaps, skin creams, or ointments only as told by your health care provider.  Keep all follow-up visits as told by your health care provider. This is important. Contact a health care provider if:  Your symptoms do not improve with treatment.  Your symptoms get worse.  You have new symptoms. This information is not intended to replace advice given to you by your health care provider. Make sure you discuss any questions you have with your health care provider. Document Released: 01/05/2005 Document Revised: 12/18/2016 Document Reviewed: 04/25/2015 Elsevier Patient Education  2020 [employer].

## 2018-12-28 NOTE — Progress Notes (Signed)
Chief Complaint  Patient presents with  . Annual Exam    fasting annual exam with pelvic. Having rashes near neck/hairline and the back of her neck and her eyelids-has used cortisone cream and has not helped, acne cream and has not helped. Arthritis has been worsening.    [name] is a 63 y.o. female who presents for a complete physical and med check (last seen in this office in 01/2018, last physical 01/2016).   She has the following concerns:  She lost her job this past week. She has regular insurance x 60d.  She was planning to retire in February after she turned 60.  She is complaining of a rash x 2-3 months, on the hairline and into the neck on both sides (behind ear). Eyelids are tight and flaky, and red. She also has some around the hairline at the temples, and a patch "like psoriasis" on the back of her scalp, with a "thick scab". The rash it itchy. Eyelids sting a bit when she puts makeup on. No changes to makeup or hair products. Used cortisone just prn (sporadically at night).  Shoulders and fingers ache more. No back or hip pain. Has had both knees replaced. Hurts at night when she sleeps on her side (shoulders).  Hypothyroidism follow-up: She has been taking her thyroid medication (Synthroid 75 mcg) along with all of her other medications, as she always has. She takes it an hour before food. She has not missed any doses. Denies hair/skin/nail/bowel changes. Just rash as stated above. Lab Results  Component Value Date   TSH 0.782 02/16/2018    Hypertension follow-up: Blood pressures are not checked elsewhere. Denies dizziness, headaches, chest pain, muscle cramps, edema. Denies side effects of medications, compliant with taking lisinopril HCT.  History of depression: She was put on medication after her divorce.Dose was tapered down to 150mg  of Wellbutrin, and then stopped a year ago.  She was advised to contact if she had any recurrent depressive symptoms. She  reports moods are good, continues to do well.  Vitamin D deficiency: Vitamin D level was last checked in 01/2018 and was normal at 50.4  At that time she was taking 5000 IU.  Prior level was in 12/2015, normal at 34,when taking 1000 IU daily. She continues to take 5000 IU daily.  Obesity--She no longer drinks sweet tea regularly (just rare).  Drinks Caffeine-free Diet 01/2016, 3/day. Only activity is taking care of her grandchildren.   Immunization History  Administered Date(s) Administered  . Influenza Split 10/12/2011  . Influenza,inj,Quad PF,6+ Mos 01/09/2013, 02/07/2014, 09/03/2014, 01/08/2016, 12/09/2016, 02/16/2018  . Tdap 09/19/2008   Last Pap smear: 2009; s/p hysterectomy Last mammogram: 07/2016 Last colonoscopy: 12/2007, Dr. 01/2008 Last DEXA: never Dentist: many years ago Ophtho: once yearly, wears contacts Exercise: yardwork, raking leaves, chasing after grandkids.  Lipids: Lab Results  Component Value Date   CHOL 174 02/16/2018   HDL 56 02/16/2018   LDLCALC 96 02/16/2018   TRIG 110 02/16/2018   CHOLHDL 3.1 02/16/2018   PMH, PSH, SH, FH were reviewed and updated  Current Outpatient Medications on File Prior to Visit  Medication Sig Dispense Refill  . aspirin 81 MG tablet Take 81 mg by mouth daily.    . Cholecalciferol (VITAMIN D3) 125 MCG (5000 UT) CAPS Take 1 capsule by mouth.    02/18/2018 lisinopril-hydrochlorothiazide (ZESTORETIC) 10-12.5 MG tablet TAKE 1 TABLET BY MOUTH EVERY DAY 90 tablet 0  . SYNTHROID 75 MCG tablet TAKE 1 TABLET  BY MOUTH EVERY DAY 90 tablet 0  . desonide (DESOWEN) 0.05 % cream Apply topically 2 (two) times daily as needed. (Patient not taking: Reported on 12/29/2018) 60 g 0   No current facility-administered medications on file prior to visit.   (not currently using desonide)  Allergies  Allergen Reactions  . Zinc Hives  . Latex Rash    ROS: The patient denies anorexia, fever, headaches, vision changes, decreased hearing, ear pain,  sore throat, breast concerns, chest pain, palpitations, dizziness, syncope, dyspnea on exertion, cough, swelling, nausea, vomiting, diarrhea, constipation, abdominal pain, melena, hematochezia, indigestion/heartburn, hematuria, incontinence, dysuria, vaginal bleeding, discharge, odor or itch, genital lesions, numbness, tingling, weakness, tremor, suspicious skin lesions, depression, anxiety, abnormal bleeding/bruising, or enlarged lymph nodes. Slight intermittent vaginal itch (unchanged). Vagisil helps some. This is just occasionally. Rash per HPI.   PHYSICAL EXAM:  BP 122/78   Pulse 68   Temp 97.8 F (36.6 C) (Tympanic)   Ht 5\' 5"  (1.651 m)   Wt 207 lb (93.9 kg)   BMI 34.45 kg/m   Wt Readings from Last 3 Encounters:  12/29/18 207 lb (93.9 kg)  02/16/18 214 lb 6.4 oz (97.3 kg)  03/15/17 208 lb 12.8 oz (94.7 kg)    General Appearance:   Alert, cooperative, no distress, appears stated age  Head:   Normocephalic, without obvious abnormality, atraumatic  Eyes:   PERRL, conjunctiva/corneas clear, EOM's intact, fundi benign  Ears:   Normal TM's and external ear canals  Nose:  Not examined, wearing mask due to COVID-19 pandemic  Throat:  Not examined, wearing mask due to COVID-19 pandemic   Neck:  Supple, no lymphadenopathy; thyroid: no enlargement/ tenderness/nodules; no carotid bruit or JVD  Back:  Spine nontender, no curvature, ROM normal, no CVA tenderness  Lungs:   Clear to auscultation bilaterally without wheezes, rales or ronchi; respirations unlabored  Chest Wall:   No tenderness or deformity  Heart:   Regular rate and rhythm, S1 and S2 normal, no murmur, rub  or gallop  Breast Exam:   No tenderness, masses, or nipple discharge or inversion.No axillary lymphadenopathy  Abdomen:   Soft, non-tender, nondistended, normoactive bowel sounds,   no masses, no hepatosplenomegaly  Genitalia:   Normal external genitalia without  lesions. BUS and vagina normal; uterus is surgically absent; adnexa not enlarged, nontender, no masses. Pap not performed  Rectal:   Normal tone, no masses or tenderness; guaiac negative stool  Extremities:  No clubbing, cyanosis or edema. Thickened, discolored right 2nd toenail, discoloration of both great toenails and L 5th toenail. Mild arthritic change/deformity starting at left index finger.   Pulses:  2+ and symmetric all extremities  Skin:  Skin color, texture, turgor normal. See below for abnormalities.  Lymph nodes:  Cervical, supraclavicular, and axillary nodes normal  Neurologic:  CNII-XII intact, normal strength, sensation and gait; reflexes 2+ and symmetric throughout   Psych: Normal mood, affect, hygiene and grooming  Small discoloration/bruise in the nail of the right index finger (growing out, per patient, with h/o trauma). Anterior neck--skin is very dry, minimally flaky. Slight rash at skin fold at right lateral neck.  On the left, the rash is limited to the area near the hairline (not extending onto neck). Eyelids and temples are without thickening or erythema, just slightly dry (wearing makeup) Posterior scalp--thickened plaque (not silvery, slightly yellowish); has indentation of the skin extending along back of head (unchanged/chronic per patient).   ASSESSMENT/PLAN:  Annual physical exam - Plan: Comprehensive metabolic panel, CBC  with Differential, TSH  Hypothyroidism, unspecified type - euthyroid by history.  - Plan: TSH  Essential hypertension, benign - well controlled. Daily exercise and weight loss recommended - Plan: Comprehensive metabolic panel  Vitamin D deficiency - continue daily supplements  Need for influenza vaccination - Plan: Flu Vaccine QUAD 6+ mos PF IM (Fluarix Quad PF)  Need for Tdap vaccination - Plan: Tdap vaccine greater than or equal to 7yo IM  Medication monitoring encounter - Plan:  Comprehensive metabolic panel, TSH  BMI 40.9-81.1,BJYNW - counseled re: diet, portions, exercise, weight loss recommended  Seborrheic dermatitis - of scalp, may also be cause of rash on face/neck. Trial of HC cream BID (and/or dandruff shampoo, to leave on/soak 10-15 mins)   meds refilled 11/2018 x 90d   Discussed monthly self breast exams and yearly mammograms (past due); at least 30 minutes of aerobic activity at least 5 days/week, weight-bearing exercise at least 2x/week (upper and lower body); proper sunscreen use reviewed; healthy diet, including goals of calcium and vitamin D intake and alcohol recommendations (less than or equal to 1 drink/day) reviewed; regular seatbelt use; changing batteries in smoke detectors. Immunization recommendations discussed--flu shot and TdaP given today.Shingrix recommended and discussed; return for [place] if covered by insurance. Colonoscopy recommendations reviewed, past due for repeat. Patient to call Dr. Lorie Apley office to schedule.  Try taking Tylenol Arthritis at bedtime to see if you sleep better, by decreasing shoulder pain.  Try using 1% hydrocortisone cream twice daily to the eyelids and around the scalp where you have the rash. If not getting better, consider following up with the Dr. Ledell Peoples office (dermatologist).  Be sure to drink plenty of water and stay well hydrated.  Moisturize well, and avoid prolonged hot showers.

## 2018-12-29 ENCOUNTER — Encounter: Payer: Self-pay | Admitting: *Deleted

## 2018-12-29 ENCOUNTER — Other Ambulatory Visit: Payer: Self-pay

## 2018-12-29 ENCOUNTER — Ambulatory Visit: Payer: BC Managed Care – PPO | Admitting: Family Medicine

## 2018-12-29 ENCOUNTER — Encounter: Payer: Self-pay | Admitting: Family Medicine

## 2018-12-29 VITALS — BP 122/78 | HR 68 | Temp 97.8°F | Ht 65.0 in | Wt 207.0 lb

## 2018-12-29 DIAGNOSIS — I10 Essential (primary) hypertension: Secondary | ICD-10-CM | POA: Diagnosis not present

## 2018-12-29 DIAGNOSIS — Z6834 Body mass index (BMI) 34.0-34.9, adult: Secondary | ICD-10-CM

## 2018-12-29 DIAGNOSIS — E039 Hypothyroidism, unspecified: Secondary | ICD-10-CM

## 2018-12-29 DIAGNOSIS — E559 Vitamin D deficiency, unspecified: Secondary | ICD-10-CM

## 2018-12-29 DIAGNOSIS — Z23 Encounter for immunization: Secondary | ICD-10-CM | POA: Diagnosis not present

## 2018-12-29 DIAGNOSIS — Z0000 Encounter for general adult medical examination without abnormal findings: Secondary | ICD-10-CM

## 2018-12-29 DIAGNOSIS — L219 Seborrheic dermatitis, unspecified: Secondary | ICD-10-CM

## 2018-12-29 DIAGNOSIS — Z5181 Encounter for therapeutic drug level monitoring: Secondary | ICD-10-CM

## 2018-12-30 LAB — COMPREHENSIVE METABOLIC PANEL
ALT: 10 IU/L (ref 0–32)
AST: 17 IU/L (ref 0–40)
Albumin/Globulin Ratio: 1.6 (ref 1.2–2.2)
Albumin: 4.8 g/dL (ref 3.8–4.8)
Alkaline Phosphatase: 122 IU/L — ABNORMAL HIGH (ref 39–117)
BUN/Creatinine Ratio: 15 (ref 12–28)
BUN: 14 mg/dL (ref 8–27)
Bilirubin Total: 0.4 mg/dL (ref 0.0–1.2)
CO2: 25 mmol/L (ref 20–29)
Calcium: 9.7 mg/dL (ref 8.7–10.3)
Chloride: 97 mmol/L (ref 96–106)
Creatinine, Ser: 0.94 mg/dL (ref 0.57–1.00)
GFR calc Af Amer: 76 mL/min/{1.73_m2} (ref >59)
GFR calc non Af Amer: 66 mL/min/{1.73_m2} (ref >59)
Globulin, Total: 3 g/dL (ref 1.5–4.5)
Glucose: 86 mg/dL (ref 65–99)
Potassium: 4 mmol/L (ref 3.5–5.2)
Sodium: 135 mmol/L (ref 134–144)
Total Protein: 7.8 g/dL (ref 6.0–8.5)

## 2018-12-30 LAB — CBC WITH DIFFERENTIAL/PLATELET
Basophils Absolute: 0.1 10*3/uL (ref 0.0–0.2)
Basos: 1 %
EOS (ABSOLUTE): 0.2 10*3/uL (ref 0.0–0.4)
Eos: 2 %
Hematocrit: 40.5 % (ref 34.0–46.6)
Hemoglobin: 13.8 g/dL (ref 11.1–15.9)
Immature Grans (Abs): 0.1 10*3/uL (ref 0.0–0.1)
Immature Granulocytes: 1 %
Lymphocytes Absolute: 3.3 10*3/uL — ABNORMAL HIGH (ref 0.7–3.1)
Lymphs: 33 %
MCH: 30.1 pg (ref 26.6–33.0)
MCHC: 34.1 g/dL (ref 31.5–35.7)
MCV: 88 fL (ref 79–97)
Monocytes Absolute: 0.9 10*3/uL (ref 0.1–0.9)
Monocytes: 9 %
Neutrophils Absolute: 5.6 10*3/uL (ref 1.4–7.0)
Neutrophils: 54 %
Platelets: 198 10*3/uL (ref 150–450)
RBC: 4.58 x10E6/uL (ref 3.77–5.28)
RDW: 13.4 % (ref 11.7–15.4)
WBC: 10.1 10*3/uL (ref 3.4–10.8)

## 2018-12-30 LAB — TSH: TSH: 1.53 u[IU]/mL (ref 0.450–4.500)

## 2019-03-12 ENCOUNTER — Other Ambulatory Visit: Payer: Self-pay | Admitting: Family Medicine

## 2019-03-12 DIAGNOSIS — E039 Hypothyroidism, unspecified: Secondary | ICD-10-CM

## 2019-03-12 DIAGNOSIS — I10 Essential (primary) hypertension: Secondary | ICD-10-CM

## 2019-05-25 ENCOUNTER — Other Ambulatory Visit: Payer: Self-pay | Admitting: Family Medicine

## 2019-05-25 DIAGNOSIS — Z1231 Encounter for screening mammogram for malignant neoplasm of breast: Secondary | ICD-10-CM

## 2019-05-29 ENCOUNTER — Ambulatory Visit
Admission: RE | Admit: 2019-05-29 | Discharge: 2019-05-29 | Disposition: A | Discharge status: Home / Self Care | Payer: BC Managed Care – PPO | Source: Ambulatory Visit | Attending: Family Medicine | Admitting: Family Medicine

## 2019-05-29 ENCOUNTER — Other Ambulatory Visit: Payer: Self-pay

## 2019-05-29 DIAGNOSIS — Z1231 Encounter for screening mammogram for malignant neoplasm of breast: Secondary | ICD-10-CM | POA: Diagnosis not present

## 2019-09-05 ENCOUNTER — Other Ambulatory Visit: Payer: Self-pay | Admitting: Family Medicine

## 2019-09-05 DIAGNOSIS — I10 Essential (primary) hypertension: Secondary | ICD-10-CM

## 2019-09-05 DIAGNOSIS — E039 Hypothyroidism, unspecified: Secondary | ICD-10-CM

## 2019-09-19 ENCOUNTER — Telehealth: Payer: Self-pay | Admitting: Family Medicine

## 2019-09-19 DIAGNOSIS — L309 Dermatitis, unspecified: Secondary | ICD-10-CM

## 2019-09-19 MED ORDER — DESONIDE 0.05 % EX CREA: TOPICAL_CREAM | Freq: Two times a day (BID) | CUTANEOUS | 0 refills | Status: DC | PRN

## 2019-09-19 NOTE — Telephone Encounter (Signed)
Called pharmacy & they were able to get the Synthroid to go thru no P.A. is needed, they just had to put in some codes.  Called pt back and informed.

## 2019-09-19 NOTE — Telephone Encounter (Signed)
Pt left message that she needs refill on Desonide cream and also her insurance is not wanting to pay for Brand only Synthroid.  I will work on [employer]. on this.  Are you ok with her switching to generic? I left a message for pt to see if she has tried generic.

## 2019-09-19 NOTE — Telephone Encounter (Signed)
Pt called back and stated she has never been on anything else but Brand

## 2019-11-29 ENCOUNTER — Other Ambulatory Visit: Payer: Self-pay | Admitting: Family Medicine

## 2019-11-29 DIAGNOSIS — I10 Essential (primary) hypertension: Secondary | ICD-10-CM

## 2019-12-11 ENCOUNTER — Other Ambulatory Visit: Payer: Self-pay | Admitting: Family Medicine

## 2019-12-11 DIAGNOSIS — E039 Hypothyroidism, unspecified: Secondary | ICD-10-CM

## 2020-01-10 ENCOUNTER — Other Ambulatory Visit: Payer: Self-pay | Admitting: *Deleted

## 2020-01-10 DIAGNOSIS — J029 Acute pharyngitis, unspecified: Secondary | ICD-10-CM

## 2020-01-10 DIAGNOSIS — R059 Cough, unspecified: Secondary | ICD-10-CM

## 2020-01-10 DIAGNOSIS — R0989 Other specified symptoms and signs involving the circulatory and respiratory systems: Secondary | ICD-10-CM

## 2020-01-10 NOTE — Patient Instructions (Addendum)
°  HEALTH MAINTENANCE RECOMMENDATIONS:  It is recommended that you get at least 30 minutes of aerobic exercise at least 5 days/week (for weight loss, you may need as much as 60-90 minutes). This can be any activity that gets your heart rate up. This can be divided in 10-15 minute intervals if needed, but try and build up your endurance at least once a week.  Weight bearing exercise is also recommended twice weekly.  Eat a healthy diet with lots of vegetables, fruits and fiber.  "Colorful" foods have a lot of vitamins (ie green vegetables, tomatoes, red peppers, etc).  Limit sweet tea, regular sodas and alcoholic beverages, all of which has a lot of calories and sugar.  Up to 1 alcoholic drink daily may be beneficial for women (unless trying to lose weight, watch sugars).  Drink a lot of water.  Calcium recommendations are 1200-1500 mg daily (1500 mg for postmenopausal women or women without ovaries), and vitamin D 1000 IU daily.  This should be obtained from diet and/or supplements (vitamins), and calcium should not be taken all at once, but in divided doses.  Monthly self breast exams and yearly mammograms for women over the age of 44 is recommended.  Sunscreen of at least SPF 30 should be used on all sun-exposed parts of the skin when outside between the hours of 10 am and 4 pm (not just when at beach or pool, but even with exercise, golf, tennis, and yard work!)  Use a sunscreen that says "broad spectrum" so it covers both UVA and UVB rays, and make sure to reapply every 1-2 hours.  Remember to change the batteries in your smoke detectors when changing your clock times in the spring and fall. Carbon monoxide detectors are recommended for your home.  Use your seat belt every time you are in a car, and please drive safely and not be distracted with cell phones and texting while driving.  I recommend getting the new shingles vaccine (Shingrix). If you have commercial insurance, check with your  insurance to verify what your out of pocket cost may be (usually covered as preventative, but better to verify to avoid any surprises, as this vaccine is expensive), and then schedule a nurse visit at our office when convenient (based on the possible side effects as discussed).  (Once you are on Medicare, you need to get this vaccine from the pharmacy, as it is covered by Medicare Part D). This is a series of 2 injections, spaced 2 months apart.  It doesn't have to be exactly 2 months apart (but can't be under 2 months), if that isn't feasible for your schedule, but try and get them close to 2 months (and definitely within 6 months of each other, or else the efficacy of the vaccine drops off).  Please contact Dr. Kenna Gilbert office to schedule your colonoscopy.  We briefly discussed Cologuard, so let us know if you change your mind and prefer that.  Return in 3 weeks for Pfizer COVID #2.  You should wait at least 2 weeks after this vaccine prior to starting the Shingrix.  Routine dental cleanings are recommended.  I don't think you NEED to be taking aspirin daily, based on your cardiovascular risks (only hypertension).

## 2020-01-10 NOTE — Progress Notes (Signed)
Chief Complaint  Patient presents with  . Annual Exam    Fasting annual exam with pelvic. Has eye appt 01/25/20. Has a cold she caught from grandkids this past weekend, neg covid/flu this am. Feel like left side collarbone sticks out more than right, wants to make sure it's ok.   . Immunizations   [name] is a 64 y.o. female who presents for a complete physical and med check.  She has the following concerns:  She has a "cold", caught it from her grandson. He ended up having croup.  She has sniffles, sneezing, sore throat, congestion and slight cough.  Symptoms x 1 week, and is improving, other than still having scratchy voice.  Never had fever.  No discolored mucus, sinus pain, shortness of breath.  Hypothyroidism follow-up: She has been taking her thyroid medication (Synthroid 75 mcg) along with all of her other medications, as she always has. She takes it an hour before food. She has not missed any doses. Denies hair/skin/nail/bowel changes.  She has had intentional weight loss.  She did note significant hair loss about 3 months after her ("coming out in clumps"), but this has improved over the last few weeks. Lab Results  Component Value Date   TSH 1.530 12/29/2018   Hypertension follow-up: Blood pressures are not checked elsewhere. Denies dizziness, headaches, chest pain, muscle cramps, edema. Denies side effects of medications, compliant with taking lisinopril HCT.  History of depression: She was put on medication after her divorce.She has been off Wellbutrin for 2 years.  Moods remain good. She lost her job--has salary continuation, will last until the end of January, and then will be "retired".  Vitamin D deficiency: Vitamin D level was last checked in 01/2018 and was normal at 50.4  At that time she was taking 5000 IU.  Prior level was in 12/2015, normal at 34,when taking 1000 IU daily. She continues to take 5000 IU daily.  Obesity--She no longer drinks sweet tea regularly  (just rare).  Drinks Caffeine-free Diet [employer], 3-4/day.  She cut out starches, eating more salads, vegetables.  Cut out fried foods.  She is counting calories, using Weight Watchers app, doing it with her daughter.  Since Thanksgiving she is taking a break, has eaten more chocolate, and gained 3# back so far.   Immunization History  Administered Date(s) Administered  . Influenza Split 10/12/2011  . Influenza,inj,Quad PF,6+ Mos 01/09/2013, 02/07/2014, 09/03/2014, 01/08/2016, 12/09/2016, 02/16/2018, 12/29/2018  . Tdap 09/19/2008, 12/29/2018   Last Pap smear: 2009; s/p hysterectomy Last mammogram: 05/2019 Last colonoscopy: 12/2007, Dr. Loreta Ave Last DEXA: never Dentist: many years ago Ophtho: once yearly, wears contacts Exercise: yardwork, raking leaves, chasing after grandkids.  Lipids: Lab Results  Component Value Date   CHOL 174 02/16/2018   HDL 56 02/16/2018   LDLCALC 96 02/16/2018   TRIG 110 02/16/2018   CHOLHDL 3.1 02/16/2018   PMH, PSH, SH, FH were reviewed and updated  Outpatient Encounter Medications as of 01/11/2020  Medication Sig  . aspirin 81 MG tablet Take 81 mg by mouth daily.  . Chlorphen-Phenyleph-ASA (ALKA-SELTZER PLUS COLD PO) Take by mouth.  . Cholecalciferol (VITAMIN D3) 125 MCG (5000 UT) CAPS Take 1 capsule by mouth.  Marland Kitchen lisinopril-hydrochlorothiazide (ZESTORETIC) 10-12.5 MG tablet TAKE 1 TABLET BY MOUTH EVERY DAY  . SYNTHROID 75 MCG tablet TAKE 1 TABLET BY MOUTH EVERY DAY  . desonide (DESOWEN) 0.05 % cream Apply topically 2 (two) times daily as needed. (Patient not taking: Reported on 01/11/2020)  No facility-administered encounter medications on file as of 01/11/2020.   Allergies  Allergen Reactions  . Zinc Hives  . Latex Rash    ROS: The patient denies anorexia, fever, headaches, vision changes, decreased hearing, ear pain, sore throat, breast concerns, chest pain, palpitations, dizziness, syncope, dyspnea on exertion, cough, swelling, nausea,  vomiting, diarrhea, constipation, abdominal pain, melena, hematochezia, indigestion/heartburn, hematuria, incontinence, dysuria, vaginal bleeding, discharge, odor or itch, genital lesions, numbness, tingling, weakness, tremor, suspicious skin lesions, depression, anxiety, abnormal bleeding/bruising, or enlarged lymph nodes. Intermittent vaginal itch Rash/seb derm comes and goes, resolves with hydrocortisone prn. Congestion/cold symptoms per HPI   PHYSICAL EXAM:  BP 110/62   Pulse 60   Temp 97.9 F (36.6 C) (Tympanic)   Ht 5\' 5"  (1.651 m)   Wt 186 lb 9.6 oz (84.6 kg)   BMI 31.05 kg/m   Wt Readings from Last 3 Encounters:  01/11/20 186 lb 9.6 oz (84.6 kg)  12/29/18 207 lb (93.9 kg)  02/16/18 214 lb 6.4 oz (97.3 kg)    General Appearance:   Alert, cooperative, no distress, appears stated age  Head:   Normocephalic, without obvious abnormality, atraumatic. Sinuses nontender  Eyes:   PERRL, conjunctiva/corneas clear, EOM's intact, fundi benign  Ears:   Normal TM's and external ear canals  Nose:  Not examined, wearing mask due to COVID-19 pandemic  Throat:  Not examined, wearing mask due to COVID-19 pandemic   Neck:  Supple, no lymphadenopathy; thyroid: no enlargement/ tenderness/nodules; no carotid bruit or JVD  Back:  Spine nontender, no curvature, ROM normal, no CVA tenderness  Lungs:   Clear to auscultation bilaterally without wheezes, rales or ronchi; respirations unlabored  Chest Wall:   No tenderness or deformity. Slightly prominent L [place] joint, nontender  Heart:   Regular rate and rhythm, S1 and S2 normal, no murmur, rub  or gallop  Breast Exam:   No tenderness, masses, or nipple discharge or inversion.No axillary lymphadenopathy  Abdomen:   Soft, non-tender, nondistended, normoactive bowel sounds,   no masses, no hepatosplenomegaly  Genitalia:   Normal external genitalia without lesions. BUS and vagina normal; uterus is  surgically absent; adnexa not enlarged, nontender, no masses. Pap not performed  Rectal:   Normal tone, no masses or tenderness; guaiac negative stool  Extremities:  No clubbing, cyanosis or edema. (previously noted thickened, discolored toenails--they are polished today)   Pulses:  2+ and symmetric all extremities  Skin:  Skin color, texture, turgor normal, no rashes or lesions. Solar changes to skin on upper back; no suspicious lesions  Lymph nodes:  Cervical, supraclavicular, and axillary nodes normal  Neurologic:  Normal strength, sensation and gait; reflexes 2+ and symmetric throughout   Psych: Normal mood, affect, hygiene and grooming  COVID test (rapid) negative.  ASSESSMENT/PLAN:  Annual physical exam - Plan: POCT Urinalysis DIP (Proadvantage Device), CBC with Differential/Platelet, Comprehensive metabolic panel, Lipid panel, TSH  Hypothyroidism, unspecified type - euthyroid by hx, hair loss likely secondary to wt loss/diet, resolving. Due for TSH - Plan: TSH  Essential hypertension, benign - well controlled - Plan: Comprehensive metabolic panel, Lipid panel  Vitamin D deficiency - continue current supplements  Medication monitoring encounter - Plan: CBC with Differential/Platelet, Comprehensive metabolic panel, TSH  Need for influenza vaccination - Plan: Flu Vaccine QUAD 6+ mos PF IM (Fluarix Quad PF)  Need for viral immunization - Plan: Pfizer SARS-COV-2 Vaccine   Discussed monthly self breast exams and yearly mammograms; at least 30 minutes of aerobic activity at least 5  days/week, weight-bearing exercise at least 2x/week (upper and lower body); proper sunscreen use reviewed; healthy diet, including goals of calcium and vitamin D intake and alcohol recommendations (less than or equal to 1 drink/day) reviewed; regular seatbelt use; changing batteries in smoke detectors. Immunization recommendations discussed--flu shot given  today.Pfizer COVID vaccine #1 given today, to return in 3 weeks for 2nd dose.  Shingrix recommended and discussed; return for NV if covered by insurance.  Colonoscopy recommendations reviewed, past due for repeat. Patient to call Dr. Kenna Gilbert office to schedule. (Also discussed Cologuard as alternative option for screening.)  F/u 3 wks for COVID #2. Discussed timing for Shingrix. F/u 1 year for CPE, sooner prn.

## 2020-01-11 ENCOUNTER — Other Ambulatory Visit: Payer: Self-pay

## 2020-01-11 ENCOUNTER — Encounter: Payer: Self-pay | Admitting: Family Medicine

## 2020-01-11 ENCOUNTER — Ambulatory Visit: Payer: BC Managed Care – PPO | Admitting: Family Medicine

## 2020-01-11 ENCOUNTER — Other Ambulatory Visit (INDEPENDENT_AMBULATORY_CARE_PROVIDER_SITE_OTHER): Payer: BC Managed Care – PPO

## 2020-01-11 VITALS — BP 110/62 | HR 60 | Temp 97.9°F | Ht 65.0 in | Wt 186.6 lb

## 2020-01-11 DIAGNOSIS — J029 Acute pharyngitis, unspecified: Secondary | ICD-10-CM

## 2020-01-11 DIAGNOSIS — E559 Vitamin D deficiency, unspecified: Secondary | ICD-10-CM

## 2020-01-11 DIAGNOSIS — R0989 Other specified symptoms and signs involving the circulatory and respiratory systems: Secondary | ICD-10-CM | POA: Diagnosis not present

## 2020-01-11 DIAGNOSIS — Z5181 Encounter for therapeutic drug level monitoring: Secondary | ICD-10-CM

## 2020-01-11 DIAGNOSIS — I10 Essential (primary) hypertension: Secondary | ICD-10-CM

## 2020-01-11 DIAGNOSIS — E039 Hypothyroidism, unspecified: Secondary | ICD-10-CM

## 2020-01-11 DIAGNOSIS — R059 Cough, unspecified: Secondary | ICD-10-CM

## 2020-01-11 DIAGNOSIS — Z0000 Encounter for general adult medical examination without abnormal findings: Secondary | ICD-10-CM

## 2020-01-11 DIAGNOSIS — Z23 Encounter for immunization: Secondary | ICD-10-CM | POA: Diagnosis not present

## 2020-01-11 LAB — POCT URINALYSIS DIP (PROADVANTAGE DEVICE)
Bilirubin, UA: NEGATIVE
Glucose, UA: NEGATIVE mg/dL
Ketones, POC UA: NEGATIVE mg/dL
Leukocytes, UA: NEGATIVE
Nitrite, UA: NEGATIVE
Protein Ur, POC: NEGATIVE mg/dL
Specific Gravity, Urine: 1.015
Urobilinogen, Ur: NEGATIVE
pH, UA: 6 (ref 5.0–8.0)

## 2020-01-11 LAB — POCT INFLUENZA A/B
Influenza A, POC: NEGATIVE
Influenza B, POC: NEGATIVE

## 2020-01-11 LAB — POC COVID19 BINAXNOW: SARS Coronavirus 2 Ag: NEGATIVE

## 2020-01-12 LAB — COMPREHENSIVE METABOLIC PANEL
ALT: 11 IU/L (ref 0–32)
AST: 14 IU/L (ref 0–40)
Albumin/Globulin Ratio: 1.9 (ref 1.2–2.2)
Albumin: 4.8 g/dL (ref 3.8–4.8)
Alkaline Phosphatase: 113 IU/L (ref 44–121)
BUN/Creatinine Ratio: 16 (ref 12–28)
BUN: 15 mg/dL (ref 8–27)
Bilirubin Total: 0.3 mg/dL (ref 0.0–1.2)
CO2: 24 mmol/L (ref 20–29)
Calcium: 10 mg/dL (ref 8.7–10.3)
Chloride: 100 mmol/L (ref 96–106)
Creatinine, Ser: 0.94 mg/dL (ref 0.57–1.00)
GFR calc Af Amer: 75 mL/min/{1.73_m2} (ref >59)
GFR calc non Af Amer: 65 mL/min/{1.73_m2} (ref >59)
Globulin, Total: 2.5 g/dL (ref 1.5–4.5)
Glucose: 97 mg/dL (ref 65–99)
Potassium: 4.5 mmol/L (ref 3.5–5.2)
Sodium: 138 mmol/L (ref 134–144)
Total Protein: 7.3 g/dL (ref 6.0–8.5)

## 2020-01-12 LAB — CBC WITH DIFFERENTIAL/PLATELET
Basophils Absolute: 0.1 10*3/uL (ref 0.0–0.2)
Basos: 1 %
EOS (ABSOLUTE): 0 10*3/uL (ref 0.0–0.4)
Eos: 1 %
Hematocrit: 41.5 % (ref 34.0–46.6)
Hemoglobin: 14 g/dL (ref 11.1–15.9)
Immature Grans (Abs): 0 10*3/uL (ref 0.0–0.1)
Immature Granulocytes: 1 %
Lymphocytes Absolute: 2.2 10*3/uL (ref 0.7–3.1)
Lymphs: 27 %
MCH: 30.7 pg (ref 26.6–33.0)
MCHC: 33.7 g/dL (ref 31.5–35.7)
MCV: 91 fL (ref 79–97)
Monocytes Absolute: 0.6 10*3/uL (ref 0.1–0.9)
Monocytes: 7 %
Neutrophils Absolute: 5.1 10*3/uL (ref 1.4–7.0)
Neutrophils: 63 %
Platelets: 175 10*3/uL (ref 150–450)
RBC: 4.56 x10E6/uL (ref 3.77–5.28)
RDW: 12.3 % (ref 11.7–15.4)
WBC: 8 10*3/uL (ref 3.4–10.8)

## 2020-01-12 LAB — TSH: TSH: 0.61 u[IU]/mL (ref 0.450–4.500)

## 2020-01-12 LAB — LIPID PANEL
Chol/HDL Ratio: 3.6 ratio (ref 0.0–4.4)
Cholesterol, Total: 217 mg/dL — ABNORMAL HIGH (ref 100–199)
HDL: 61 mg/dL (ref >39)
LDL Chol Calc (NIH): 134 mg/dL — ABNORMAL HIGH (ref 0–99)
Triglycerides: 127 mg/dL (ref 0–149)
VLDL Cholesterol Cal: 22 mg/dL (ref 5–40)

## 2020-02-01 ENCOUNTER — Ambulatory Visit: Payer: BC Managed Care – PPO

## 2020-03-04 ENCOUNTER — Telehealth: Payer: Self-pay | Admitting: Family Medicine

## 2020-03-04 ENCOUNTER — Other Ambulatory Visit: Payer: Self-pay | Admitting: Family Medicine

## 2020-03-04 DIAGNOSIS — E039 Hypothyroidism, unspecified: Secondary | ICD-10-CM

## 2020-03-04 DIAGNOSIS — I10 Essential (primary) hypertension: Secondary | ICD-10-CM

## 2020-03-04 MED ORDER — LEVOTHYROXINE SODIUM 75 MCG PO TABS: 75.0000 ug | ORAL_TABLET | Freq: Every day | ORAL | 2 refills | Status: DC

## 2020-03-04 NOTE — Telephone Encounter (Signed)
Pt called and states her new insurance, [employer], will not cover anything but generic medications. She is asking that name brand synthroid be changed to generic. Please send to [facility] [address]

## 2020-10-16 ENCOUNTER — Encounter: Payer: Self-pay | Admitting: *Deleted

## 2020-12-03 ENCOUNTER — Other Ambulatory Visit: Payer: Self-pay | Admitting: Family Medicine

## 2020-12-03 DIAGNOSIS — I10 Essential (primary) hypertension: Secondary | ICD-10-CM

## 2021-01-14 NOTE — Progress Notes (Signed)
Chief Complaint  Patient presents with   Annual Exam    Wishes to gte Flu shot today, refuses Covid booster   [name] is a 65 y.o. female who presents for a complete physical and med check.  She has the following concerns:  She is complaining of pain across her upper back, and in both shoulders. She has been doing a lot of raking. Started 2 months ago on the R side, now also having on the left.  It starts at the shoulder, and spreads into the muscles of the upper arms.  Her daughter pushed something on her back, and now she has more pain across her shoulder pains. She has taken ibuprofen 800mg  BID sporadically. It doesn't seem to help much, also needed Tylenol, and the combination has dulled the pain some.  It hurts for her to sleep at night, to lay on her arm/shoulder. . No shooting down the arm, no associated numbness or tingling, no weakness.  Hypothyroidism follow-up: She has been taking her thyroid medication (switched from Synthroid to generic 75 mcg in 01/2020) along with all of her other medications, as she always has. She takes it an hour before food. She has not missed any doses.  Denies hair/skin/nail/bowel changes. She has noted some weight gain, which she attributes to some stress (related to job loss, family losses, depression) "I eat anything I see"--stress eating, some fast food while mother was in the hospital.  Lab Results  Component Value Date   TSH 0.610 01/11/2020   Hypertension follow-up: Blood pressures are not checked elsewhere. Denies dizziness, headaches, chest pain, muscle cramps, edema. Denies side effects of medications, compliant with taking lisinopril HCT.    History of depression: She was put on medication after her divorce. She has been off Wellbutrin for a few years, and up until recently, moods remained good. She lost her job last year, had salary continued through the end of 01/2020, and is now retired. June 2022 she was dealing with her mother's  declining health, she then passed away in 2022/08/30. She also lost a granddaugher to a MVA in July. She has had trouble sleeping since the summer--she has taken melatonin, which helps get her to sleep, but still wakes up frequently during the night.  Obesity--She no longer drinks sweet tea regularly (just rare).  Drinks Caffeine-free Diet August, 3-4/day.   She was successful in her weight loss in the past when she cut out starches, was eating more salads, vegetables, cut out fried foods.  She was counting calories, using Weight Watchers app, doing it with her daughter.  Her daughter has also gained weight. She realizes that she needs to get back to what she was doing before, but the depression is keeping her from being able to at the moment.  Vitamin D deficiency:  Vitamin D level was last checked in 01/2018 and was normal at 50.4  At that time she was taking 5000 IU.  Prior level was in 12/2015, normal at 34,when taking 1000 IU daily. She continues to take 5000 IU daily.   Immunization History  Administered Date(s) Administered   Influenza Split 10/12/2011   Influenza,inj,Quad PF,6+ Mos 01/09/2013, 02/07/2014, 09/03/2014, 01/08/2016, 12/09/2016, 02/16/2018, 12/29/2018, 01/11/2020   PFIZER(Purple Top)SARS-COV-2 Vaccination 01/11/2020   Tdap 09/19/2008, 12/29/2018   She never had 2nd Pfizer, but got COVID in 01/2020. Last Pap smear: 2009; s/p hysterectomy Last mammogram: 05/2019  Last colonoscopy: 12/2007, Dr. 01/2008 Last DEXA: never Dentist: many years ago Ophtho: once yearly,  wears contacts, due. Exercise: yardwork, raking leaves, chasing after grandkids. 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   Lipids: Lab Results  Component Value Date   CHOL 217 (H) 01/11/2020   HDL 61 01/11/2020   LDLCALC 134 (H) 01/11/2020   TRIG 127 01/11/2020   CHOLHDL 3.6 01/11/2020   PMH, PSH, SH, FH were reviewed and updated   Outpatient  Encounter Medications as of 01/16/2021  Medication Sig Note   aspirin 81 MG tablet Take 81 mg by mouth daily.    Cholecalciferol (VITAMIN D3) 125 MCG (5000 UT) CAPS Take 1 capsule by mouth.    levothyroxine (SYNTHROID) 75 MCG tablet TAKE 1 TABLET BY MOUTH DAILY BEFORE BREAKFAST.    lisinopril-hydrochlorothiazide (ZESTORETIC) 10-12.5 MG tablet TAKE 1 TABLET BY MOUTH EVERY DAY    Chlorphen-Phenyleph-ASA (ALKA-SELTZER PLUS COLD PO) Take by mouth. (Patient not taking: Reported on 01/16/2021)    desonide (DESOWEN) 0.05 % cream Apply topically 2 (two) times daily as needed. (Patient not taking: Reported on 01/11/2020) 01/16/2021: Last taken 1 week ago   No facility-administered encounter medications on file as of 01/16/2021.   Allergies  Allergen Reactions   Zinc Hives   Latex Rash    ROS:  The patient denies anorexia, fever, headaches,  vision changes, decreased hearing, ear pain, sore throat, breast concerns, chest pain, palpitations, dizziness, syncope, dyspnea on exertion, cough, swelling, nausea, vomiting, diarrhea, constipation, abdominal pain, melena, hematochezia, indigestion/heartburn, hematuria, incontinence, dysuria, vaginal bleeding, discharge, odor or itch, genital lesions, numbness, tingling, weakness, tremor, suspicious skin lesions, depression, anxiety, abnormal bleeding/bruising, or enlarged lymph nodes. Shoulder pain per HPI Depression, insomnia per HPI. 25# weight gain   PHYSICAL EXAM:  BP 128/82 (BP Location: Right Arm, Patient Position: Sitting)    Pulse 88    Ht 5' 4.5" (1.638 m)    Wt 209 lb 12.8 oz (95.2 kg)    SpO2 98%    BMI 35.46 kg/m   Wt Readings from Last 3 Encounters:  01/16/21 209 lb 12.8 oz (95.2 kg)  01/11/20 186 lb 9.6 oz (84.6 kg)  12/29/18 207 lb (93.9 kg)    General Appearance:     Alert, cooperative, no distress, appears stated age   Head:     Normocephalic, without obvious abnormality, atraumatic.   Eyes:     PERRL, conjunctiva/corneas clear,  EOM's intact, fundi benign   Ears:     Normal TM's and external ear canals   Nose:    Normal, no drainage  Throat:    OP is clear, no lesions.  Neck:    Supple, no lymphadenopathy;  thyroid:  no   enlargement/ tenderness/nodules; no carotid bruit or JVD. She is mildly diffusely tender along trapezius and paraspinous muscles; no spasm.   Back:     Spine nontender, no curvature, ROM normal, no CVA     tenderness   Lungs:      Clear to auscultation bilaterally without wheezes, rales or     ronchi; respirations unlabored   Chest Wall:     No tenderness or deformity. Nontender at [facility] joints.   Heart:     Regular rate and rhythm, S1 and S2 normal, no murmur, rub   or gallop. Occasional ectopic beats noted.  Breast Exam:     No tenderness, masses, or nipple discharge or inversion. No axillary lymphadenopathy   Abdomen:      Soft, non-tender, nondistended, normoactive bowel sounds,     no masses,  no hepatosplenomegaly   Genitalia:     Normal external genitalia without lesions. BUS and vagina normal; uterus is surgically absent; adnexa not enlarged, nontender, no masses.  Pap not performed   Rectal:     Normal tone, no masses or tenderness; guaiac negative stool   Extremities:    No clubbing, cyanosis or edema.  Pulses:    2+ and symmetric all extremities   Skin:    Skin color, texture, turgor normal, no rashes or lesions.   Lymph nodes:    Cervical, supraclavicular, inguinal and axillary nodes normal   Neurologic:    Normal strength, sensation and gait; reflexes 2+ and symmetric throughout                            Psych:   Normal mood, affect, hygiene and grooming   Depression screen [facility] 2/9 01/16/2021 01/11/2020 12/29/2018 12/09/2016  Decreased Interest 2 0 0 0  Down, Depressed, Hopeless 1 0 0 0  PHQ - 2 Score 3 0 0 0  Altered sleeping 3 - - -  Tired, decreased energy 1 - - -  Change in appetite 2 - - -  Feeling bad or failure about yourself  0 - - -  Trouble concentrating 0 - - -  Moving  slowly or fidgety/restless 0 - - -  Suicidal thoughts 0 - - -  PHQ-9 Score 9 - - -  Difficult doing work/chores Not difficult at all - - -    ASSESSMENT/PLAN:  Annual physical exam - Plan: TSH, CBC with Differential/Platelet, Comprehensive metabolic panel, Lipid panel, HIV Antibody (routine testing w rflx)  Needs flu shot - Plan: Flu Vaccine QUAD 88mo+IM (Fluarix, Fluzone & Alfiuria Quad PF)  Colon cancer screening - past due, refer back to Dr. Collene Mares - Plan: Ambulatory referral to Gastroenterology  Hypothyroidism, unspecified type - Due for TSH. Meds changed to generic last year. +weight gain, no other thyroid sx - Plan: TSH  Essential hypertension, benign - controlled, continue current meds - Plan: Comprehensive metabolic panel, Lipid panel  Medication monitoring encounter - Plan: TSH, CBC with Differential/Platelet, Comprehensive metabolic panel  Screening for HIV (human immunodeficiency virus) - Plan: HIV Antibody (routine testing w rflx)  Depression, recurrent ([place]) - check TSH to ensure not contributing to mood changes. Restart Wellbutrin XL 150, incr to 300mg  after a week. Counseling encouraged, incl grief counseling - Plan: buPROPion (WELLBUTRIN XL) 150 MG 24 hr tablet, buPROPion (WELLBUTRIN XL) 300 MG 24 hr tablet  Insomnia, unspecified type - risks/SE of trazodone reviewed. - Plan: traZODone (DESYREL) 50 MG tablet  Bilateral shoulder pain, unspecified chronicity - normal exam, reassured. Likely related to overuse.  Trial NSAID course, heat, stretches shown, massage - Plan: meloxicam (MOBIC) 15 MG tablet  NSAID risks/SE and precautions reviewed.  To give mobic trial for her neck pain, in addition to stretches (shown for neck and shoulder/arms). Also trial heat, massage.  Risks/SE for trazodone and wellbutrin reviewed, and how to titrate the doses. F/u in 6 weeks, sooner prn, on depression/insomnia and shoulder pain.  Lisinopril HCT and thyroid were refilled in 11/2020 for #90   (with 2 RF!), so not needed today.  Discussed monthly self breast exams and yearly mammograms (past due, reminded to schedule); at least 30 minutes of aerobic activity at least 5 days/week, weight-bearing exercise at least 2x/week (upper and lower body); proper sunscreen use reviewed; healthy diet, including goals of calcium and vitamin D intake and  alcohol recommendations (less than or equal to 1 drink/day) reviewed; regular seatbelt use; changing batteries in smoke detectors.  Immunization recommendations discussed--flu shot and COVID booster given today. Shingrix recommended, to check insurance and schedule NV. Colonoscopy recommendations reviewed, past due for repeat. Referred back to Dr. Collene Mares. Routine dental cleanings recommended.  F/u 6 wks on depression

## 2021-01-16 ENCOUNTER — Ambulatory Visit (INDEPENDENT_AMBULATORY_CARE_PROVIDER_SITE_OTHER): Payer: 59 | Admitting: Family Medicine

## 2021-01-16 ENCOUNTER — Encounter: Payer: Self-pay | Admitting: Family Medicine

## 2021-01-16 ENCOUNTER — Other Ambulatory Visit: Payer: Self-pay

## 2021-01-16 VITALS — BP 128/82 | HR 88 | Ht 64.5 in | Wt 209.8 lb

## 2021-01-16 DIAGNOSIS — M25512 Pain in left shoulder: Secondary | ICD-10-CM

## 2021-01-16 DIAGNOSIS — Z1211 Encounter for screening for malignant neoplasm of colon: Secondary | ICD-10-CM | POA: Diagnosis not present

## 2021-01-16 DIAGNOSIS — Z114 Encounter for screening for human immunodeficiency virus [HIV]: Secondary | ICD-10-CM

## 2021-01-16 DIAGNOSIS — Z5181 Encounter for therapeutic drug level monitoring: Secondary | ICD-10-CM | POA: Diagnosis not present

## 2021-01-16 DIAGNOSIS — Z23 Encounter for immunization: Secondary | ICD-10-CM | POA: Diagnosis not present

## 2021-01-16 DIAGNOSIS — Z0000 Encounter for general adult medical examination without abnormal findings: Secondary | ICD-10-CM

## 2021-01-16 DIAGNOSIS — F339 Major depressive disorder, recurrent, unspecified: Secondary | ICD-10-CM

## 2021-01-16 DIAGNOSIS — E039 Hypothyroidism, unspecified: Secondary | ICD-10-CM

## 2021-01-16 DIAGNOSIS — I10 Essential (primary) hypertension: Secondary | ICD-10-CM | POA: Diagnosis not present

## 2021-01-16 DIAGNOSIS — M25511 Pain in right shoulder: Secondary | ICD-10-CM

## 2021-01-16 DIAGNOSIS — G4700 Insomnia, unspecified: Secondary | ICD-10-CM

## 2021-01-16 MED ORDER — BUPROPION HCL ER (XL) 300 MG PO TB24: 300.0000 mg | ORAL_TABLET | Freq: Every day | ORAL | 0 refills | Status: DC

## 2021-01-16 MED ORDER — BUPROPION HCL ER (XL) 150 MG PO TB24: ORAL_TABLET | ORAL | 0 refills | Status: DC

## 2021-01-16 MED ORDER — TRAZODONE HCL 50 MG PO TABS: 25.0000 mg | ORAL_TABLET | Freq: Every evening | ORAL | 1 refills | Status: DC | PRN

## 2021-01-16 MED ORDER — MELOXICAM 15 MG PO TABS: 15.0000 mg | ORAL_TABLET | Freq: Every day | ORAL | 0 refills | Status: DC

## 2021-01-16 NOTE — Patient Instructions (Addendum)
HEALTH MAINTENANCE RECOMMENDATIONS:  It is recommended that you get at least 30 minutes of aerobic exercise at least 5 days/week (for weight loss, you may need as much as 60-90 minutes). This can be any activity that gets your heart rate up. This can be divided in 10-15 minute intervals if needed, but try and build up your endurance at least once a week.  Weight bearing exercise is also recommended twice weekly.  Eat a healthy diet with lots of vegetables, fruits and fiber.  "Colorful" foods have a lot of vitamins (ie green vegetables, tomatoes, red peppers, etc).  Limit sweet tea, regular sodas and alcoholic beverages, all of which has a lot of calories and sugar.  Up to 1 alcoholic drink daily may be beneficial for women (unless trying to lose weight, watch sugars).  Drink a lot of water.  Calcium recommendations are 1200-1500 mg daily (1500 mg for postmenopausal women or women without ovaries), and vitamin D 1000 IU daily.  This should be obtained from diet and/or supplements (vitamins), and calcium should not be taken all at once, but in divided doses.  Monthly self breast exams and yearly mammograms for women over the age of 39 is recommended.  Sunscreen of at least SPF 30 should be used on all sun-exposed parts of the skin when outside between the hours of 10 am and 4 pm (not just when at beach or pool, but even with exercise, golf, tennis, and yard work!)  Use a sunscreen that says "broad spectrum" so it covers both UVA and UVB rays, and make sure to reapply every 1-2 hours.  Remember to change the batteries in your smoke detectors when changing your clock times in the spring and fall. Carbon monoxide detectors are recommended for your home.  Use your seat belt every time you are in a car, and please drive safely and not be distracted with cell phones and texting while driving.  [facility].  Please contact [facility]  ([email or web address]) for grief counseling.  Restart wellbutrin.  Start at 150mg  for a week, then double up to 300 mg (take two of the 150mg  tablets until you run out, then start the 300mg  dose.  Both will be sent to your pharmacy). I'm also sending in a prescription for Trazodone, to help you sleep.  Start with 1/2 tablet, and if that isn't effective, you can increase to the full tablet.  You can further increase by 1/2 tablet at a time until you get to 2 full pills, if needed. You may use it daily at first, but ultimately, as the Wellbutrin helps with moods, you may be able to back off it, and use it just as needed (you don't NEED to take this every night).  Please get back to your former diet--cutting back on carbs/starches, and eating more salads and vegetables.  I encouraged you to track your calories again, as this was working well for you.   I recommend getting the new shingles vaccine (Shingrix). You may want to check with your insurance to verify what your out of pocket cost may be (usually covered as preventative, but better to verify to avoid any surprises, as this vaccine is expensive), and then schedule a nurse visit at our office when convenient (based on the possible side effects as discussed).   This is a series of 2 injections, spaced 2 months apart.  It doesn't have to be exactly 2 months apart (but can't be sooner),  if that isn't feasible for your schedule, but try and get them close to 2 months (and definitely within 6 months of each other, or else the efficacy of the vaccine drops off). This should be separated from other vaccines by at least 2 weeks.  Routine dental cleanings are recommended.  Stop taking ibuprofen. Instead take meloxicam once daily with food. Take this for up to the full 2 weeks, if needed (can stop when pain is better). Use heat, massage, and stretches as we discussed. Do not use aleve, Goody, BC, or other pain medications along with the meloxicam. You CAN use Tylenol  with it.

## 2021-01-17 LAB — COMPREHENSIVE METABOLIC PANEL WITH GFR
ALT: 12 [IU]/L (ref 0–32)
AST: 12 [IU]/L (ref 0–40)
Albumin/Globulin Ratio: 1.8 (ref 1.2–2.2)
Albumin: 5 g/dL — ABNORMAL HIGH (ref 3.8–4.8)
Alkaline Phosphatase: 106 [IU]/L (ref 44–121)
BUN/Creatinine Ratio: 16 (ref 12–28)
BUN: 13 mg/dL (ref 8–27)
Bilirubin Total: 0.5 mg/dL (ref 0.0–1.2)
CO2: 24 mmol/L (ref 20–29)
Calcium: 9.7 mg/dL (ref 8.7–10.3)
Chloride: 95 mmol/L — ABNORMAL LOW (ref 96–106)
Creatinine, Ser: 0.81 mg/dL (ref 0.57–1.00)
Globulin, Total: 2.8 g/dL (ref 1.5–4.5)
Glucose: 95 mg/dL (ref 70–99)
Potassium: 4.2 mmol/L (ref 3.5–5.2)
Sodium: 136 mmol/L (ref 134–144)
Total Protein: 7.8 g/dL (ref 6.0–8.5)
eGFR: 82 mL/min/{1.73_m2}

## 2021-01-17 LAB — CBC WITH DIFFERENTIAL/PLATELET
Basophils Absolute: 0.1 10*3/uL (ref 0.0–0.2)
Basos: 2 %
EOS (ABSOLUTE): 0 10*3/uL (ref 0.0–0.4)
Eos: 0 %
Hematocrit: 39 % (ref 34.0–46.6)
Hemoglobin: 13.6 g/dL (ref 11.1–15.9)
Immature Grans (Abs): 0 10*3/uL (ref 0.0–0.1)
Immature Granulocytes: 0 %
Lymphocytes Absolute: 2.5 10*3/uL (ref 0.7–3.1)
Lymphs: 27 %
MCH: 30.6 pg (ref 26.6–33.0)
MCHC: 34.9 g/dL (ref 31.5–35.7)
MCV: 88 fL (ref 79–97)
Monocytes Absolute: 0.6 10*3/uL (ref 0.1–0.9)
Monocytes: 7 %
Neutrophils Absolute: 5.9 10*3/uL (ref 1.4–7.0)
Neutrophils: 64 %
Platelets: 225 10*3/uL (ref 150–450)
RBC: 4.44 x10E6/uL (ref 3.77–5.28)
RDW: 13.3 % (ref 11.7–15.4)
WBC: 9.2 10*3/uL (ref 3.4–10.8)

## 2021-01-17 LAB — LIPID PANEL
Chol/HDL Ratio: 4.2 ratio (ref 0.0–4.4)
Cholesterol, Total: 220 mg/dL — ABNORMAL HIGH (ref 100–199)
HDL: 52 mg/dL
LDL Chol Calc (NIH): 137 mg/dL — ABNORMAL HIGH (ref 0–99)
Triglycerides: 173 mg/dL — ABNORMAL HIGH (ref 0–149)
VLDL Cholesterol Cal: 31 mg/dL (ref 5–40)

## 2021-01-17 LAB — HIV ANTIBODY (ROUTINE TESTING W REFLEX): HIV Screen 4th Generation wRfx: NONREACTIVE

## 2021-01-17 LAB — TSH: TSH: 1.14 u[IU]/mL (ref 0.450–4.500)

## 2021-01-21 DIAGNOSIS — Z23 Encounter for immunization: Secondary | ICD-10-CM | POA: Diagnosis not present

## 2021-01-21 NOTE — Addendum Note (Signed)
Addended by: Fulton Reek A on: 01/21/2021 08:34 AM   Modules accepted: Orders

## 2021-01-26 ENCOUNTER — Other Ambulatory Visit: Payer: Self-pay | Admitting: Family Medicine

## 2021-01-26 DIAGNOSIS — G4700 Insomnia, unspecified: Secondary | ICD-10-CM

## 2021-01-26 DIAGNOSIS — F339 Major depressive disorder, recurrent, unspecified: Secondary | ICD-10-CM

## 2021-01-28 ENCOUNTER — Other Ambulatory Visit: Payer: Self-pay | Admitting: Family Medicine

## 2021-01-28 DIAGNOSIS — Z1231 Encounter for screening mammogram for malignant neoplasm of breast: Secondary | ICD-10-CM

## 2021-01-31 ENCOUNTER — Ambulatory Visit
Admission: RE | Admit: 2021-01-31 | Discharge: 2021-01-31 | Disposition: A | Discharge status: Home / Self Care | Payer: Managed Care, Other (non HMO) | Source: Ambulatory Visit | Attending: Family Medicine | Admitting: Family Medicine

## 2021-01-31 DIAGNOSIS — Z1231 Encounter for screening mammogram for malignant neoplasm of breast: Secondary | ICD-10-CM

## 2021-02-05 ENCOUNTER — Other Ambulatory Visit: Payer: Self-pay | Admitting: Family Medicine

## 2021-02-05 DIAGNOSIS — M25511 Pain in right shoulder: Secondary | ICD-10-CM

## 2021-02-05 DIAGNOSIS — M25512 Pain in left shoulder: Secondary | ICD-10-CM

## 2021-02-05 NOTE — Telephone Encounter (Signed)
Patient did request this refill. She is still having pain-also did use heat, massage and did stretching as discussed.

## 2021-02-05 NOTE — Telephone Encounter (Signed)
Left message asking patient if she requested this or if this may have been an auto-refill.

## 2021-02-13 ENCOUNTER — Other Ambulatory Visit: Payer: Self-pay | Admitting: Family Medicine

## 2021-02-13 DIAGNOSIS — G4700 Insomnia, unspecified: Secondary | ICD-10-CM

## 2021-03-01 ENCOUNTER — Encounter: Payer: Self-pay | Admitting: Family Medicine

## 2021-03-01 NOTE — Progress Notes (Signed)
Chief Complaint  Patient presents with   Depression    6 week follow up on depression, insomnia and shoulder pain. Has B/L rash in her underarms x 2 weeks. Has been using Desowen cream and not really working. Rash is itchy. She called insurance and she can get shingrix here or pharmacy-she would like to do here and schedule a NV when she doesn't have anything the next day.     Patient presents for 6 week follow-up on depression, insomnia and shoulder pain.  Today she is also complaining of a rash x 2 weeks.  Rash: Started suddenly 2 weeks ago, under both axilla.  Denies any change in deoderant, soap or other products. She previously was prescribed Desowen (08/2019), tried this twice daily for the last 2 weeks.  She thinks the L side is less red, but unchanged on the right. She also tried an OTC hydrocortisone cream, which she felt made it more inflamed, didn't help with the itching.  It is very itchy. She feels like these areas get weepy.  At her physical in 12/2020 she was having pain in both shoulders.  Exam was normal; she was given course of meloxicam (and got a refill--took x 4 weeks), instructed on heat, stretches, massage. Shoulders are much improved, but not resolved.  She can hear some clicking, but isn't painful.  She is back to using tylenol arthritis as needed.  She states she isn't ready for PT.  She was noted to have recurrent depression.  She was restarted on Wellbutrin, and counseling was encouraged.  PHQ-9 score was 9. She hasn't looked into grief counseling or therapy. She is currently taking 300mg  daily. She feels like it is helping. She states she feels much better, and isn't interested in changes to medication or wanting to start therapy. She was also prescribed trazodone to help with sleep. 50mg  dose is working well. She is taking it nightly, denies side effects.  She was noted to have gained weight (186# in 12/2019, 209# in 12/2020). She attributed this to some stress  (related to job loss, family losses, depression) "I eat anything I see" Thyroid was rechecked to ensure not contributing to weight gain and worsening of moods, but was at goal on current med. She has lost a little since her last visit.  Lab Results  Component Value Date   TSH 1.140 01/16/2021    PMH, PSH, SH reviewed  Outpatient Encounter Medications as of 03/03/2021  Medication Sig   aspirin 81 MG tablet Take 81 mg by mouth daily.   buPROPion (WELLBUTRIN XL) 300 MG 24 hr tablet Take 1 tablet (300 mg total) by mouth daily.   Cholecalciferol (VITAMIN D3) 125 MCG (5000 UT) CAPS Take 1 capsule by mouth.   desonide (DESOWEN) 0.05 % cream Apply topically 2 (two) times daily as needed.   levothyroxine (SYNTHROID) 75 MCG tablet TAKE 1 TABLET BY MOUTH DAILY BEFORE BREAKFAST.   lisinopril-hydrochlorothiazide (ZESTORETIC) 10-12.5 MG tablet TAKE 1 TABLET BY MOUTH EVERY DAY   Omega-3 Fatty Acids (FISH OIL) 1200 MG CAPS Take 2 each by mouth daily.   traZODone (DESYREL) 50 MG tablet Take 0.5-1 tablets (25-50 mg total) by mouth at bedtime as needed for sleep. May further increase dose up to 2 tablets, if needed   meloxicam (MOBIC) 15 MG tablet TAKE 1 TABLET (15 MG TOTAL) BY MOUTH DAILY. TAKE DAILY UNTIL PAIN RESOLVED (Patient not taking: Reported on 03/03/2021)   [DISCONTINUED] buPROPion (WELLBUTRIN XL) 150 MG 24 hr tablet Take 1  tablet by mouth every morning.  Increase to 2 tablets after 1 week, then switch to the 300mg  tablet   [DISCONTINUED] Chlorphen-Phenyleph-ASA (ALKA-SELTZER PLUS COLD PO) Take by mouth. (Patient not taking: Reported on 01/16/2021)   No facility-administered encounter medications on file as of 03/03/2021.   Allergies  Allergen Reactions   Zinc Hives   Latex Rash    ROS: Denies fever, chills, URI symptoms, headaches, dizziness, shortness of breath, chest pain.  Denies nausea, vomiting, bowel changes, urinary complaints, bleeding, bruising. Depression is somewhat improved, per  HPI Insomnia--controlled with trazodone Shoulder pain--improved, but not resolved Rash in bilateral axillae per HPI See HPI   PHYSICAL EXAM:  BP 120/70    Pulse 80    Ht 5' 4.5" (1.638 m)    Wt 206 lb 6.4 oz (93.6 kg)    BMI 34.88 kg/m   Wt Readings from Last 3 Encounters:  03/03/21 206 lb 6.4 oz (93.6 kg)  01/16/21 209 lb 12.8 oz (95.2 kg)  01/11/20 186 lb 9.6 oz (84.6 kg)   Well-appearing, pleasant female, with occasional deep cough (pt states present since she had COVID) She is alert, oriented.  Wearing mask.  Conjunctiva and sclera are clear, EOMI. She is in good spirits, with full range of affect. Normal speech, eye contact, hygiene and grooming.  R axilla--diffuse erythema at the axilla, with some raised edges/papules at the posterior border. No central clearing. L axilla--mild hyperpigmentation vs faint erythema, not raised. No flaking.  Depression screen [facility] 2/9 03/03/2021 01/16/2021 01/11/2020 12/29/2018 12/09/2016  Decreased Interest 1 2 0 0 0  Down, Depressed, Hopeless 1 1 0 0 0  PHQ - 2 Score 2 3 0 0 0  Altered sleeping 1 3 - - -  Tired, decreased energy 1 1 - - -  Change in appetite 1 2 - - -  Feeling bad or failure about yourself  1 0 - - -  Trouble concentrating 1 0 - - -  Moving slowly or fidgety/restless 1 0 - - -  Suicidal thoughts 0 0 - - -  PHQ-9 Score 8 9 - - -  Difficult doing work/chores Not difficult at all Not difficult at all - - -    ASSESSMENT/PLAN:  Depression, recurrent (HCC) - PHQ-9 scores reviewed--minimally better per score, pt feels like Wellbutrin is helping. Cont 300mg . Encouraged counseling  Insomnia, unspecified type - trazodone is effective.  Unclear if she truly needs it every night, can try skipping.  As depression improves, may not need it every night, but ok if needed - Plan: traZODone (DESYREL) 50 MG tablet  Rash - in R>L axilla. Didn't respond to steroids (weak), possibly worse. Cover for fungal etiology. Change razor. f/u if  persists/worsens  Bilateral shoulder pain, unspecified chronicity - encouraged continue stretches, massage, heat. To consider PT if persists/worsens. Reviewed posture. Reassured regarding painless clicking

## 2021-03-03 ENCOUNTER — Encounter: Payer: Self-pay | Admitting: Family Medicine

## 2021-03-03 ENCOUNTER — Other Ambulatory Visit: Payer: Self-pay

## 2021-03-03 ENCOUNTER — Ambulatory Visit (INDEPENDENT_AMBULATORY_CARE_PROVIDER_SITE_OTHER): Payer: Managed Care, Other (non HMO) | Admitting: Family Medicine

## 2021-03-03 VITALS — BP 120/70 | HR 80 | Ht 64.5 in | Wt 206.4 lb

## 2021-03-03 DIAGNOSIS — G4700 Insomnia, unspecified: Secondary | ICD-10-CM | POA: Diagnosis not present

## 2021-03-03 DIAGNOSIS — M25511 Pain in right shoulder: Secondary | ICD-10-CM

## 2021-03-03 DIAGNOSIS — M25512 Pain in left shoulder: Secondary | ICD-10-CM

## 2021-03-03 DIAGNOSIS — F339 Major depressive disorder, recurrent, unspecified: Secondary | ICD-10-CM | POA: Diagnosis not present

## 2021-03-03 DIAGNOSIS — R21 Rash and other nonspecific skin eruption: Secondary | ICD-10-CM | POA: Diagnosis not present

## 2021-03-03 MED ORDER — TRAZODONE HCL 50 MG PO TABS: 50.0000 mg | ORAL_TABLET | Freq: Every evening | ORAL | 1 refills | Status: DC | PRN

## 2021-03-03 NOTE — Patient Instructions (Addendum)
Since your shoulder pain didn't respond completely to two courses of anti-inflammatory medication, next step would be physical therapy. You can either refer yourself to [facility] or [facility] (both excellent places), or I can refer you anywhere you want. Continue to work on posture, frequent stretches, heat and massage as we previously discussed.  You can also try topical voltaren gel, and/or topical lidocaine as needed for pain (found in [place], and others).  Your rash under your arms is likely fungal, which is why it didn't respond to the Desowen or the hydrocortisone creams. Try using either Lamisil or Clotrimasol (lotrimin) creams twice daily.  These are over-the-counter (sold for athlete's foot). I would change out your razor. Consider changing deoderants. Keep the area as dry as possible. Contact us if not improving, and feel free to send a photo through MyChart.

## 2021-03-26 ENCOUNTER — Other Ambulatory Visit: Payer: Self-pay

## 2021-03-26 ENCOUNTER — Other Ambulatory Visit (INDEPENDENT_AMBULATORY_CARE_PROVIDER_SITE_OTHER): Payer: Managed Care, Other (non HMO)

## 2021-03-26 DIAGNOSIS — Z23 Encounter for immunization: Secondary | ICD-10-CM | POA: Diagnosis not present

## 2021-04-02 ENCOUNTER — Other Ambulatory Visit: Payer: Self-pay | Admitting: Family Medicine

## 2021-04-02 DIAGNOSIS — G4700 Insomnia, unspecified: Secondary | ICD-10-CM

## 2021-04-23 LAB — HM COLONOSCOPY

## 2021-04-25 ENCOUNTER — Other Ambulatory Visit: Payer: Self-pay | Admitting: Family Medicine

## 2021-04-25 DIAGNOSIS — F339 Major depressive disorder, recurrent, unspecified: Secondary | ICD-10-CM

## 2021-04-30 ENCOUNTER — Encounter: Payer: Self-pay | Admitting: Family Medicine

## 2021-05-03 ENCOUNTER — Other Ambulatory Visit: Payer: Self-pay | Admitting: Family Medicine

## 2021-05-03 DIAGNOSIS — G4700 Insomnia, unspecified: Secondary | ICD-10-CM

## 2021-05-14 ENCOUNTER — Encounter: Payer: Self-pay | Admitting: Family Medicine

## 2021-05-26 ENCOUNTER — Other Ambulatory Visit (INDEPENDENT_AMBULATORY_CARE_PROVIDER_SITE_OTHER): Payer: Commercial Managed Care - HMO

## 2021-05-26 DIAGNOSIS — Z23 Encounter for immunization: Secondary | ICD-10-CM | POA: Diagnosis not present

## 2021-06-03 ENCOUNTER — Other Ambulatory Visit: Payer: Self-pay | Admitting: Family Medicine

## 2021-06-03 DIAGNOSIS — G4700 Insomnia, unspecified: Secondary | ICD-10-CM

## 2021-06-03 NOTE — Telephone Encounter (Signed)
She was last rx'd this #90 with 1 RF in 02/2021, and stated at that time she was taking 1 tablet nightly. ?She should not need this (should have refill), and definitely not the requested amt (180), since only taking 1/d. ?

## 2021-06-03 NOTE — Telephone Encounter (Signed)
Left message for pt to call me back 

## 2021-06-04 NOTE — Telephone Encounter (Signed)
Pt is only taking 1 tablet nighty and has plenty so does not need a refill at this time. I will deny ? ?

## 2021-06-07 ENCOUNTER — Other Ambulatory Visit: Payer: Self-pay | Admitting: Family Medicine

## 2021-06-07 DIAGNOSIS — G4700 Insomnia, unspecified: Secondary | ICD-10-CM

## 2021-07-27 ENCOUNTER — Other Ambulatory Visit: Payer: Self-pay | Admitting: Family Medicine

## 2021-07-27 DIAGNOSIS — F339 Major depressive disorder, recurrent, unspecified: Secondary | ICD-10-CM

## 2021-08-22 ENCOUNTER — Other Ambulatory Visit: Payer: Self-pay | Admitting: Family Medicine

## 2021-08-22 DIAGNOSIS — F339 Major depressive disorder, recurrent, unspecified: Secondary | ICD-10-CM

## 2021-08-22 NOTE — Telephone Encounter (Signed)
Pt has an appt in January 2024 

## 2021-08-26 ENCOUNTER — Other Ambulatory Visit: Payer: Self-pay | Admitting: Family Medicine

## 2021-08-26 DIAGNOSIS — I10 Essential (primary) hypertension: Secondary | ICD-10-CM

## 2021-09-01 ENCOUNTER — Encounter: Payer: 59 | Admitting: Family Medicine

## 2021-09-24 ENCOUNTER — Encounter: Payer: Self-pay | Admitting: Internal Medicine

## 2021-11-22 ENCOUNTER — Other Ambulatory Visit: Payer: Self-pay | Admitting: Family Medicine

## 2021-11-22 DIAGNOSIS — F339 Major depressive disorder, recurrent, unspecified: Secondary | ICD-10-CM

## 2021-11-22 DIAGNOSIS — I10 Essential (primary) hypertension: Secondary | ICD-10-CM

## 2022-01-25 NOTE — Patient Instructions (Incomplete)
  HEALTH MAINTENANCE RECOMMENDATIONS:  It is recommended that you get at least 30 minutes of aerobic exercise at least 5 days/week (for weight loss, you may need as much as 60-90 minutes). This can be any activity that gets your heart rate up. This can be divided in 10-15 minute intervals if needed, but try and build up your endurance at least once a week.  Weight bearing exercise is also recommended twice weekly.  Eat a healthy diet with lots of vegetables, fruits and fiber.  "Colorful" foods have a lot of vitamins (ie green vegetables, tomatoes, red peppers, etc).  Limit sweet tea, regular sodas and alcoholic beverages, all of which has a lot of calories and sugar.  Up to 1 alcoholic drink daily may be beneficial for women (unless trying to lose weight, watch sugars).  Drink a lot of water.  Calcium recommendations are 1200-1500 mg daily (1500 mg for postmenopausal women or women without ovaries), and vitamin D 1000 IU daily.  This should be obtained from diet and/or supplements (vitamins), and calcium should not be taken all at once, but in divided doses.  Monthly self breast exams and yearly mammograms for women over the age of 79 is recommended.  Sunscreen of at least SPF 30 should be used on all sun-exposed parts of the skin when outside between the hours of 10 am and 4 pm (not just when at beach or pool, but even with exercise, golf, tennis, and yard work!)  Use a sunscreen that says "broad spectrum" so it covers both UVA and UVB rays, and make sure to reapply every 1-2 hours.  Remember to change the batteries in your smoke detectors when changing your clock times in the spring and fall. Carbon monoxide detectors are recommended for your home.  Use your seat belt every time you are in a car, and please drive safely and not be distracted with cell phones and texting while driving.  Bone density test is recommended at age 49.

## 2022-01-25 NOTE — Progress Notes (Deleted)
No chief complaint on file.  [name] is a 67 y.o. female who presents for a complete physical and med check.  She has the following concerns:  At her physical in 12/2020 she was having pain in both shoulders.  She was treated with NSAIDS (4 weeks of meloxicam), instructed on heat, stretches, massage and she got much better.  She reports hearing some clicking, but isn't painful.  She uses tylenol arthritis as needed.  Recurrent depression--noted at her physical 12/2020. Previously had taken Wellbutrin after her divorce, but had been off medication for a few years.  She was restarted on Wellbutrin in 12/2020, and counseling was encouraged.  PHQ-9 score was 9 prior to restarting meds. At her follow-up in February 2023, she reported the 300mg  of Wellbutrin was helping. She wasn't interested in counseling (we had discussed grief counseling (due to loss of her mother and granddaughter), or individual therapy).  She had been having trouble sleeping, and trazodone 50mg  qHS works well.    Still taking?  Last filled x 6 mos in 02/2021    Obesity: She was noted to have gained weight last year (186# in 12/2019, 209# in 12/2020), which she attributed this to some stress (related to job loss, family losses, depression, stress-eating). TSH was normal at that time.  She only rarely drinks sweet tea.  Drinks Caffeine-free Diet 01/2020, 3-4/day.    She was successful in her weight loss in the past when she cut out starches, was eating more salads, vegetables, cut out fried foods.  She was counting calories, using Weight Watchers app, doing it with her daughter (who also gained weight.)   Hypothyroidism follow-up: She has been taking her thyroid medication (switched from Synthroid to generic 75 mcg in 01/2020) along with all of her other medications, as she always has. She takes it an hour before food. She has not missed any doses.  Denies hair/skin/nail/bowel changes. Energy weight  Lab Results   Component Value Date   TSH 1.140 01/16/2021   Hypertension follow-up: Blood pressures are not checked elsewhere. Denies dizziness, headaches, chest pain, muscle cramps, edema. Denies side effects of medications, compliant with taking lisinopril HCT. BP Readings from Last 3 Encounters:  03/03/21 120/70  01/16/21 128/82  01/11/20 110/62   Vitamin D deficiency:  Vitamin D level was last checked in 01/2018 and was normal at 50.4  At that time she was taking 5000 IU.  Prior level was in 12/2015, normal at 34,when taking 1000 IU daily. She continues to take 5000 IU daily.   Immunization History  Administered Date(s) Administered   Influenza Split 10/12/2011   Influenza,inj,Quad PF,6+ Mos 01/09/2013, 02/07/2014, 09/03/2014, 01/08/2016, 12/09/2016, 02/16/2018, 12/29/2018, 01/11/2020, 01/16/2021   PFIZER(Purple Top)SARS-COV-2 Vaccination 01/11/2020   Pfizer Covid-19 Vaccine Bivalent Booster 10yrs & up 01/21/2021   Tdap 09/19/2008, 12/29/2018   Zoster Recombinat (Shingrix) 03/26/2021, 05/26/2021   She never had 2nd dose of Pfizer COVID vaccine, but got COVID in 01/2020. Last Pap smear: 2009; s/p hysterectomy Last mammogram: 01/2021 Last colonoscopy: 04/2021, 1 tubular adenoma, small internal hemorrhoids.  Recheck 5 years (Dr. 02/2021) Last DEXA: never Dentist: many years ago Ophtho: once yearly, wears contacts, due. Exercise:  yardwork, raking leaves, chasing after grandkids. 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   Lipids: Lab Results  Component Value Date   CHOL 220 (H) 01/16/2021   HDL 52 01/16/2021   LDLCALC 137 (H) 01/16/2021   TRIG 173 (H) 01/16/2021  CHOLHDL 4.2 01/16/2021     PMH, PSH, SH, FH were reviewed and updated  ROS:  The patient denies anorexia, fever, headaches,  vision changes, decreased hearing, ear pain, sore throat, breast concerns, chest pain, palpitations, dizziness, syncope, dyspnea on exertion, cough,  swelling, nausea, vomiting, diarrhea, constipation, abdominal pain, melena, hematochezia, indigestion/heartburn, hematuria, incontinence, dysuria, vaginal bleeding, discharge, odor or itch, genital lesions, numbness, tingling, weakness, tremor, suspicious skin lesions, depression, anxiety, abnormal bleeding/bruising, or enlarged lymph nodes. Shoulder pain per HPI Depression, insomnia per HPI. weight   PHYSICAL EXAM:  There were no vitals taken for this visit.  Wt Readings from Last 3 Encounters:  03/03/21 206 lb 6.4 oz (93.6 kg)  01/16/21 209 lb 12.8 oz (95.2 kg)  01/11/20 186 lb 9.6 oz (84.6 kg)    General Appearance:    Alert, cooperative, no distress, appears stated age   Head:     Normocephalic, without obvious abnormality, atraumatic.   Eyes:     PERRL, conjunctiva/corneas clear, EOM's intact, fundi benign   Ears:     Normal TM's and external ear canals   Nose:    Normal, no drainage  Throat:    OP is clear, no lesions.  Neck:    Supple, no lymphadenopathy;  thyroid:  no enlargement/ tenderness/nodules; no carotid bruit or JVD.   Back:     Spine nontender, no curvature, ROM normal, no CVA     tenderness   Lungs:      Clear to auscultation bilaterally without wheezes, rales or     ronchi; respirations unlabored   Chest Wall:     No tenderness or deformity. Nontender at [facility] joints.   Heart:     Regular rate and rhythm, S1 and S2 normal, no murmur, rub   or gallop.   Breast Exam:     No tenderness, masses, or nipple discharge or inversion. No axillary lymphadenopathy   Abdomen:      Soft, non-tender, nondistended, normoactive bowel sounds,     no masses, no hepatosplenomegaly   Genitalia:     Normal external genitalia without lesions. BUS and vagina normal; uterus is surgically absent; adnexa not enlarged, nontender, no masses.  Pap not performed   Rectal:     Normal tone, no masses or tenderness; guaiac negative stool   Extremities:    No clubbing, cyanosis or edema.  Pulses:     2+ and symmetric all extremities   Skin:    Skin color, texture, turgor normal, no rashes or lesions.   Lymph nodes:    Cervical, supraclavicular, inguinal and axillary nodes normal   Neurologic:    Normal strength, sensation and gait; reflexes 2+ and symmetric throughout                            Psych:   Normal mood, affect, hygiene and grooming     ASSESSMENT/PLAN:  PHQ-9  Enter/offer/decline flu shot and COVID booster Taking trazodone nightly?? Or just prn (last rf x 35mos in Feb)  Lisinopril, wellbutrin and thyroid med RF due in early Feb--do now and after labs back for thyroid   Dexa age 12--ORDER (?delay mammo and do together?, whenever Medicare effective? Don't know current insurance)  Cbc, c-met, lipids, TSH D only if change in supplements    Discussed monthly self breast exams and yearly mammograms; at least 30 minutes of aerobic activity at least 5 days/week, weight-bearing exercise at least 2x/week (upper and lower body);  proper sunscreen use reviewed; healthy diet, including goals of calcium and vitamin D intake and alcohol recommendations (less than or equal to 1 drink/day) reviewed; regular seatbelt use; changing batteries in smoke detectors.  Immunization recommendations discussed--flu shot and COVID booster given today. Shingrix recommended, to check insurance and schedule NV. Colonoscopy recommendations reviewed, UTD.  Due again 04/2026. DEXA age 89 Routine dental cleanings recommended.

## 2022-01-26 ENCOUNTER — Telehealth: Payer: Self-pay | Admitting: *Deleted

## 2022-01-26 ENCOUNTER — Encounter: Payer: 59 | Admitting: Family Medicine

## 2022-01-26 DIAGNOSIS — Z0000 Encounter for general adult medical examination without abnormal findings: Secondary | ICD-10-CM

## 2022-01-26 DIAGNOSIS — I10 Essential (primary) hypertension: Secondary | ICD-10-CM

## 2022-01-26 DIAGNOSIS — F339 Major depressive disorder, recurrent, unspecified: Secondary | ICD-10-CM

## 2022-01-26 DIAGNOSIS — Z5181 Encounter for therapeutic drug level monitoring: Secondary | ICD-10-CM

## 2022-01-26 NOTE — Telephone Encounter (Signed)
Patient called and had to cancel this morning, her grandson has covid and she is his childcare. Rescheduled her to 02/19/22 @ 2:45pm.

## 2022-01-26 NOTE — Telephone Encounter (Signed)
Please charge $50 no show fee. She is aware of this.  [name] already rescheduled her visit.

## 2022-01-27 ENCOUNTER — Encounter: Payer: Self-pay | Admitting: Internal Medicine

## 2022-02-18 NOTE — Patient Instructions (Incomplete)
  HEALTH MAINTENANCE RECOMMENDATIONS:  It is recommended that you get at least 30 minutes of aerobic exercise at least 5 days/week (for weight loss, you may need as much as 60-90 minutes). This can be any activity that gets your heart rate up. This can be divided in 10-15 minute intervals if needed, but try and build up your endurance at least once a week.  Weight bearing exercise is also recommended twice weekly.  Eat a healthy diet with lots of vegetables, fruits and fiber.  "Colorful" foods have a lot of vitamins (ie green vegetables, tomatoes, red peppers, etc).  Limit sweet tea, regular sodas and alcoholic beverages, all of which has a lot of calories and sugar.  Up to 1 alcoholic drink daily may be beneficial for women (unless trying to lose weight, watch sugars).  Drink a lot of water.  Calcium recommendations are 1200-1500 mg daily (1500 mg for postmenopausal women or women without ovaries), and vitamin D 1000 IU daily.  This should be obtained from diet and/or supplements (vitamins), and calcium should not be taken all at once, but in divided doses.  Monthly self breast exams and yearly mammograms for women over the age of 38 is recommended.  Sunscreen of at least SPF 30 should be used on all sun-exposed parts of the skin when outside between the hours of 10 am and 4 pm (not just when at beach or pool, but even with exercise, golf, tennis, and yard work!)  Use a sunscreen that says "broad spectrum" so it covers both UVA and UVB rays, and make sure to reapply every 1-2 hours.  Remember to change the batteries in your smoke detectors when changing your clock times in the spring and fall. Carbon monoxide detectors are recommended for your home.  Use your seat belt every time you are in a car, and please drive safely and not be distracted with cell phones and texting while driving.   You are due for mammogram, please schedule. Bone density test is recommended.  Please contact the breast center  to schedule both of these tests.

## 2022-02-18 NOTE — Progress Notes (Signed)
Chief Complaint  Patient presents with   Medicare Wellness    Fasting Welcome to medicare visit with pelvic. No new concerns. Has not scheduled mammogram yet, would like to wait and schedule with DEXA. Stopped taking trazodone months ago as she was doing better,uses melatonin from time to time. No new concerns.    [name] is a 67 y.o. female who presents for a Welcome to [facility] Physical and med check.    At her physical in 12/2020 she was having pain in both shoulders.  She was treated with NSAIDS (4 weeks of meloxicam), instructed on heat, stretches, massage and she got much better.  She still reports hearing some clicking, but isn't painful.  She uses tylenol arthritis as needed. She has some discomfort after carrying heavy things or a lot of raking.  Tylenol Arthritis at bedtime helps.   Recurrent depression--noted at her physical 12/2020. Previously had taken Wellbutrin after her divorce, but had been off medication for a few years.  She was restarted on Wellbutrin in 12/2020, and counseling was encouraged.  PHQ-9 score was 9 prior to restarting meds. At her follow-up in February 2023, she reported the 300mg  of Wellbutrin was helping. She wasn't interested in counseling (we had discussed grief counseling, due to loss of her mother and granddaughter, or individual therapy).  She had been having trouble sleeping, and trazodone 50mg  qHS was effective.  She continues on the Wellbutrin, reports being "so much better".  No longer needing Trazodone to sleep, occasionally uses melatonin with good results.  She is interested in cutting back the dose to see if she can go off of it again. She is much better re: grief from losses.  Denies any significant stressors currently.   Obesity: She was noted to have gained weight last year (186# in 12/2019, 209# in 12/2020), which she attributed this to some stress (related to job loss, family losses, depression, stress-eating). TSH was normal at that time. She  still avoids sweet tea, and has only diet sodas (3/day)   She was successful in her weight loss in the past when she cut out starches, was eating more salads, vegetables, cut out fried foods.  She was counting calories, using Weight Watchers app, doing it with her daughter (who also gained weight.) Daughter has seen a dietician about weight loss. Patient would like to restart Weight Watchers.   Hypothyroidism follow-up: She has been taking her thyroid medication (switched from Synthroid to generic 75 mcg in 01/2020) along with all of her other medications, as she always has. She takes it an hour before food. She has not missed any doses.  Denies hair/skin/nail/bowel changes. Energy is good, no weight changes.   Lab Results  Component Value Date   TSH 1.140 01/16/2021    Hypertension follow-up: Blood pressures are not checked elsewhere. Denies dizziness, headaches, chest pain, muscle cramps, edema. Denies side effects of medications, compliant with taking lisinopril HCT. BP Readings from Last 3 Encounters:  02/19/22 124/84  03/03/21 120/70  01/16/21 128/82   Vitamin D deficiency:  Vitamin D level was last checked in 01/2018 and was normal at 50.4  At that time she was taking 5000 IU.  Prior level was in 12/2015, normal at 34,when taking 1000 IU daily. She continues to take 5000 IU daily.  Immunization History  Administered Date(s) Administered   Influenza Split 10/12/2011   Influenza,inj,Quad PF,6+ Mos 01/09/2013, 02/07/2014, 09/03/2014, 01/08/2016, 12/09/2016, 02/16/2018, 12/29/2018, 01/11/2020, 01/16/2021   PFIZER(Purple Top)SARS-COV-2 Vaccination 01/11/2020  Pension scheme manager 7yrs & up 01/21/2021   Tdap 09/19/2008, 12/29/2018   Zoster Recombinat (Shingrix) 03/26/2021, 05/26/2021   She never had 2nd dose of Duran vaccine, but got COVID in 01/2020. Last Pap smear: 2009; s/p hysterectomy Last mammogram: 01/2021 Last colonoscopy: 04/2021, 1 tubular adenoma,  small internal hemorrhoids.  Recheck 5 years (Dr. Collene Mares) Last DEXA: never Dentist: many years ago; she just got dental insurance and plans to go. Ophtho: once yearly, wears contacts Exercise: yardwork, raking leaves, chasing after grandkids.   Lipids: Lab Results  Component Value Date   CHOL 220 (H) 01/16/2021   HDL 52 01/16/2021   LDLCALC 137 (H) 01/16/2021   TRIG 173 (H) 01/16/2021   CHOLHDL 4.2 01/16/2021   Patient Care Team: Rita Ohara, MD as PCP - General (Family Medicine) Ortho: Dr. Tonita Cong GI: Dr. Collene Mares Ophtho: [facility] Dentist will be [facility]  Depression Screening:     02/19/2022    2:53 PM 03/03/2021   10:48 AM 01/16/2021    1:46 PM 01/11/2020    8:41 AM 12/29/2018    2:07 PM  Depression screen PHQ 2/9  Decreased Interest 0 1 2 0 0  Down, Depressed, Hopeless 0 1 1 0 0  PHQ - 2 Score 0 2 3 0 0  Altered sleeping 0 1 3    Tired, decreased energy 0 1 1    Change in appetite 0 1 2    Feeling bad or failure about yourself  0 1 0    Trouble concentrating 0 1 0    Moving slowly or fidgety/restless 0 1 0    Suicidal thoughts 0 0 0    PHQ-9 Score 0 8 9    Difficult doing work/chores Not difficult at all Not difficult at all Not difficult at all      Falls screen:     02/19/2022    2:53 PM 03/03/2021   10:48 AM  Fall Risk   Falls in the past year? 0 0  Number falls in past yr: 0 0  Injury with Fall? 0 0  Risk for fall due to : No Fall Risks No Fall Risks  Follow up Falls evaluation completed Falls evaluation completed     Functional Status Survey: Is the patient deaf or have difficulty hearing?: No Does the patient have difficulty seeing, even when wearing glasses/contacts?: No Does the patient have difficulty concentrating, remembering, or making decisions?: No Does the patient have difficulty walking or climbing stairs?: No Does the patient have difficulty dressing or bathing?: No Does the patient have difficulty doing errands alone  such as visiting a doctor's office or shopping?: No  Mini-Cog Scoring: 5    End of Life Discussion:  Patient does not have a living will and medical power of attorney    PMH, PSH, SH, FH were reviewed and updated   Outpatient Encounter Medications as of 02/19/2022  Medication Sig Note   aspirin 81 MG tablet Take 81 mg by mouth daily.    buPROPion (WELLBUTRIN XL) 300 MG 24 hr tablet TAKE 1 TABLET BY MOUTH EVERY DAY    Cholecalciferol (VITAMIN D3) 125 MCG (5000 UT) CAPS Take 1 capsule by mouth.    levothyroxine (SYNTHROID) 75 MCG tablet TAKE 1 TABLET BY MOUTH EVERY DAY BEFORE BREAKFAST    lisinopril-hydrochlorothiazide (ZESTORETIC) 10-12.5 MG tablet TAKE 1 TABLET BY MOUTH EVERY DAY    Omega-3 Fatty Acids (FISH OIL) 1200 MG CAPS Take 2 each by mouth  daily. 02/19/2022: Takes 2, 3 times a week   desonide (DESOWEN) 0.05 % cream Apply topically 2 (two) times daily as needed.    [DISCONTINUED] meloxicam (MOBIC) 15 MG tablet TAKE 1 TABLET (15 MG TOTAL) BY MOUTH DAILY. TAKE DAILY UNTIL PAIN RESOLVED (Patient not taking: Reported on 03/03/2021)    [DISCONTINUED] traZODone (DESYREL) 50 MG tablet Take 1 tablet (50 mg total) by mouth at bedtime as needed for sleep. May further increase dose up to 2 tablets, if needed (Patient not taking: Reported on 02/19/2022) 02/19/2022: Stopped months ago   No facility-administered encounter medications on file as of 02/19/2022.   Allergies  Allergen Reactions   Zinc Hives   Latex Rash    ROS:  The patient denies anorexia, fever, headaches,  vision changes, decreased hearing, ear pain, sore throat, breast concerns, chest pain, palpitations, dizziness, syncope, dyspnea on exertion, cough, swelling, nausea, vomiting, diarrhea, constipation, abdominal pain, melena, hematochezia, indigestion/heartburn, hematuria, incontinence, dysuria, vaginal bleeding, discharge, odor or itch, genital lesions, numbness, tingling, weakness, tremor, suspicious skin lesions, depression, anxiety,  abnormal bleeding/bruising, or enlarged lymph nodes. Shoulder pain after a lot of activity. Depression and insomnia, improved per HPI. Weight unchanged.     PHYSICAL EXAM:  BP 124/84   Pulse 80   Ht 5' 4.5" (1.638 m)   Wt 208 lb (94.3 kg)   BMI 35.15 kg/m   110/68 on repeat by MD  Wt Readings from Last 3 Encounters:  02/19/22 208 lb (94.3 kg)  03/03/21 206 lb 6.4 oz (93.6 kg)  01/16/21 209 lb 12.8 oz (95.2 kg)      General Appearance:    Alert, cooperative, no distress, appears stated age   Head:     Normocephalic, without obvious abnormality, atraumatic.   Eyes:     PERRL, conjunctiva/corneas clear, EOM's intact, fundi benign   Ears:     Normal TM's and external ear canals   Nose:    Normal, no drainage  Throat:    OP is clear, no lesions.  Neck:    Supple, no lymphadenopathy;  thyroid:  no enlargement/ tenderness/nodules; no carotid bruit or JVD.   Back:     Spine nontender, no curvature, ROM normal, no CVA tenderness   Lungs:      Clear to auscultation bilaterally without wheezes, rales or ronchi; respirations unlabored   Chest Wall:     No tenderness or deformity.    Heart:     Regular rate and rhythm, S1 and S2 normal, no murmur, rub or gallop.   Breast Exam:     No tenderness, masses, or nipple discharge or inversion. No axillary lymphadenopathy   Abdomen:      Soft, non-tender, nondistended, normoactive bowel sounds, no masses, no hepatosplenomegaly   Genitalia:     Normal external genitalia without lesions. BUS and vagina normal; uterus is surgically absent; adnexa not enlarged, nontender, no masses.  Pap not performed   Rectal:     Normal tone, no masses or tenderness; guaiac negative stool   Extremities:    No clubbing, cyanosis or edema.  Pulses:    2+ and symmetric all extremities   Skin:    Skin color, texture, turgor normal, no rashes or lesions.   Lymph nodes:    Cervical, supraclavicular, inguinal and axillary nodes normal   Neurologic:    Normal strength,  sensation and gait; reflexes 2+ and symmetric throughout               Psych:  Normal mood, affect, hygiene and grooming   normal urine dip  EKG NSR, rate 87.  Poss LAE.  Compared to EKG 02/2014, unchanged.     ASSESSMENT/PLAN:   Welcome to Medicare preventive visit - Plan: EKG 12-Lead  Hypothyroidism, unspecified type - euthyroid clinically, due for labs - Plan: TSH, levothyroxine (SYNTHROID) 75 MCG tablet  Essential hypertension, benign - well controlled, cont current meds. Reviewed low Na diet, exercise, healthy diet, weight loss - Plan: POCT Urinalysis DIP (Proadvantage Device), Comprehensive metabolic panel, Lipid panel, lisinopril-hydrochlorothiazide (ZESTORETIC) 10-12.5 MG tablet  Depression, major, recurrent, in remission (HCC) - trial at lower wellbutrin dose, potential to stop entirely if doing well (vs staying on, if needed) - Plan: buPROPion (WELLBUTRIN XL) 150 MG 24 hr tablet  Insomnia, unspecified type - significantly improved, using melatonin prn with good results  Medication monitoring encounter - Plan: CBC with Differential/Platelet, Comprehensive metabolic panel, Lipid panel, TSH  Need for influenza vaccination - Plan: Flu Vaccine QUAD 6+ mos PF IM (Fluarix Quad PF)  Need for COVID-19 vaccine - Plan: Pfizer Fall 2023 Covid-19 Vaccine 64yrs and older  Postmenopausal estrogen deficiency - discussed calcium, vitamin D and weight-bearing exercise.  To call [facility] to schedule DEXA and mammo - Plan: DG Bone Density  Class 2 severe obesity due to excess calories with serious comorbidity and body mass index (BMI) of 35.0 to 35.9 in adult [facility]) - comorbidities include HTN, depression. Counseled re: healthy diet, portions, exercise, obesity risks    Discussed monthly self breast exams and yearly mammograms; at least 30 minutes of aerobic activity at least 5 days/week, weight-bearing exercise at least 2x/week (upper and lower body); proper sunscreen use reviewed; healthy diet,  including goals of calcium and vitamin D intake and alcohol recommendations (less than or equal to 1 drink/day) reviewed; regular seatbelt use; changing batteries in smoke detectors.  Immunization recommendations discussed--flu shot and COVID booster given today. Return for [place] in 3 weeks (age 55) for prevnar-20. Colonoscopy recommendations reviewed, UTD.  Due again 04/2026. DEXA age 39--ordered, to call Breast center and schedule Routine dental cleanings recommended.    Medicare Attestation I have personally reviewed: The patient's medical and social history Their use of alcohol, tobacco or illicit drugs Their current medications and supplements The patient's functional ability including ADLs,fall risks, home safety risks, cognitive, and hearing and visual impairment Diet and physical activities Evidence for depression or mood disorders  The patient's weight, height, BMI have been recorded in the chart.  I have made referrals, counseling, and provided education to the patient based on review of the above and I have provided the patient with a written personalized care plan for preventive services.     Lavonda Jumbo, MD

## 2022-02-19 ENCOUNTER — Encounter: Payer: Self-pay | Admitting: Family Medicine

## 2022-02-19 ENCOUNTER — Ambulatory Visit (INDEPENDENT_AMBULATORY_CARE_PROVIDER_SITE_OTHER): Payer: Medicare Other | Admitting: Family Medicine

## 2022-02-19 VITALS — BP 110/68 | HR 80 | Ht 64.5 in | Wt 208.0 lb

## 2022-02-19 DIAGNOSIS — F3340 Major depressive disorder, recurrent, in remission, unspecified: Secondary | ICD-10-CM | POA: Diagnosis not present

## 2022-02-19 DIAGNOSIS — G4700 Insomnia, unspecified: Secondary | ICD-10-CM | POA: Diagnosis not present

## 2022-02-19 DIAGNOSIS — F339 Major depressive disorder, recurrent, unspecified: Secondary | ICD-10-CM

## 2022-02-19 DIAGNOSIS — Z780 Asymptomatic menopausal state: Secondary | ICD-10-CM

## 2022-02-19 DIAGNOSIS — E039 Hypothyroidism, unspecified: Secondary | ICD-10-CM

## 2022-02-19 DIAGNOSIS — I10 Essential (primary) hypertension: Secondary | ICD-10-CM | POA: Diagnosis not present

## 2022-02-19 DIAGNOSIS — Z23 Encounter for immunization: Secondary | ICD-10-CM | POA: Diagnosis not present

## 2022-02-19 DIAGNOSIS — Z6835 Body mass index (BMI) 35.0-35.9, adult: Secondary | ICD-10-CM

## 2022-02-19 DIAGNOSIS — Z5181 Encounter for therapeutic drug level monitoring: Secondary | ICD-10-CM

## 2022-02-19 DIAGNOSIS — Z0000 Encounter for general adult medical examination without abnormal findings: Secondary | ICD-10-CM | POA: Diagnosis not present

## 2022-02-19 LAB — POCT URINALYSIS DIP (PROADVANTAGE DEVICE)
Bilirubin, UA: NEGATIVE
Blood, UA: NEGATIVE
Glucose, UA: NEGATIVE mg/dL
Ketones, POC UA: NEGATIVE mg/dL
Leukocytes, UA: NEGATIVE
Nitrite, UA: NEGATIVE
Protein Ur, POC: NEGATIVE mg/dL
Specific Gravity, Urine: 1.005
Urobilinogen, Ur: 0.2
pH, UA: 6.5 (ref 5.0–8.0)

## 2022-02-19 MED ORDER — BUPROPION HCL ER (XL) 150 MG PO TB24: 150.0000 mg | ORAL_TABLET | Freq: Every day | ORAL | 1 refills | Status: DC

## 2022-02-19 MED ORDER — LISINOPRIL-HYDROCHLOROTHIAZIDE 10-12.5 MG PO TABS: 1.0000 | ORAL_TABLET | Freq: Every day | ORAL | 3 refills | Status: DC

## 2022-02-20 LAB — CBC WITH DIFFERENTIAL/PLATELET
Basophils Absolute: 0.1 10*3/uL (ref 0.0–0.2)
Basos: 1 %
EOS (ABSOLUTE): 0.1 10*3/uL (ref 0.0–0.4)
Eos: 1 %
Hematocrit: 39.3 % (ref 34.0–46.6)
Hemoglobin: 13.6 g/dL (ref 11.1–15.9)
Immature Grans (Abs): 0.1 10*3/uL (ref 0.0–0.1)
Immature Granulocytes: 1 %
Lymphocytes Absolute: 2.7 10*3/uL (ref 0.7–3.1)
Lymphs: 27 %
MCH: 30 pg (ref 26.6–33.0)
MCHC: 34.6 g/dL (ref 31.5–35.7)
MCV: 87 fL (ref 79–97)
Monocytes Absolute: 0.8 10*3/uL (ref 0.1–0.9)
Monocytes: 8 %
Neutrophils Absolute: 6.2 10*3/uL (ref 1.4–7.0)
Neutrophils: 62 %
Platelets: 208 10*3/uL (ref 150–450)
RBC: 4.53 x10E6/uL (ref 3.77–5.28)
RDW: 12.9 % (ref 11.7–15.4)
WBC: 10 10*3/uL (ref 3.4–10.8)

## 2022-02-20 LAB — TSH: TSH: 1.08 u[IU]/mL (ref 0.450–4.500)

## 2022-02-20 LAB — LIPID PANEL
Chol/HDL Ratio: 3.9 ratio (ref 0.0–4.4)
Cholesterol, Total: 205 mg/dL — ABNORMAL HIGH (ref 100–199)
HDL: 53 mg/dL (ref >39)
LDL Chol Calc (NIH): 123 mg/dL — ABNORMAL HIGH (ref 0–99)
Triglycerides: 167 mg/dL — ABNORMAL HIGH (ref 0–149)
VLDL Cholesterol Cal: 29 mg/dL (ref 5–40)

## 2022-02-20 LAB — COMPREHENSIVE METABOLIC PANEL
ALT: 13 IU/L (ref 0–32)
AST: 11 IU/L (ref 0–40)
Albumin/Globulin Ratio: 1.8 (ref 1.2–2.2)
Albumin: 4.8 g/dL (ref 3.9–4.9)
Alkaline Phosphatase: 98 IU/L (ref 44–121)
BUN/Creatinine Ratio: 17 (ref 12–28)
BUN: 15 mg/dL (ref 8–27)
Bilirubin Total: 0.4 mg/dL (ref 0.0–1.2)
CO2: 22 mmol/L (ref 20–29)
Calcium: 10 mg/dL (ref 8.7–10.3)
Chloride: 96 mmol/L (ref 96–106)
Creatinine, Ser: 0.89 mg/dL (ref 0.57–1.00)
Globulin, Total: 2.7 g/dL (ref 1.5–4.5)
Glucose: 91 mg/dL (ref 70–99)
Potassium: 4.1 mmol/L (ref 3.5–5.2)
Sodium: 135 mmol/L (ref 134–144)
Total Protein: 7.5 g/dL (ref 6.0–8.5)
eGFR: 72 mL/min/{1.73_m2} (ref >59)

## 2022-02-20 MED ORDER — LEVOTHYROXINE SODIUM 75 MCG PO TABS: ORAL_TABLET | ORAL | 3 refills | Status: DC

## 2022-02-23 ENCOUNTER — Other Ambulatory Visit: Payer: Self-pay | Admitting: Family Medicine

## 2022-02-23 DIAGNOSIS — F339 Major depressive disorder, recurrent, unspecified: Secondary | ICD-10-CM

## 2022-03-03 ENCOUNTER — Other Ambulatory Visit: Payer: Self-pay | Admitting: Family Medicine

## 2022-03-03 DIAGNOSIS — Z780 Asymptomatic menopausal state: Secondary | ICD-10-CM

## 2022-03-03 DIAGNOSIS — Z1231 Encounter for screening mammogram for malignant neoplasm of breast: Secondary | ICD-10-CM

## 2022-03-12 ENCOUNTER — Other Ambulatory Visit (INDEPENDENT_AMBULATORY_CARE_PROVIDER_SITE_OTHER): Payer: Medicare Other

## 2022-03-12 DIAGNOSIS — Z23 Encounter for immunization: Secondary | ICD-10-CM

## 2022-03-18 ENCOUNTER — Other Ambulatory Visit: Payer: Self-pay | Admitting: Family Medicine

## 2022-03-18 DIAGNOSIS — F3340 Major depressive disorder, recurrent, in remission, unspecified: Secondary | ICD-10-CM

## 2022-04-19 ENCOUNTER — Other Ambulatory Visit: Payer: Self-pay | Admitting: Family Medicine

## 2022-04-19 DIAGNOSIS — F3340 Major depressive disorder, recurrent, in remission, unspecified: Secondary | ICD-10-CM

## 2022-04-19 NOTE — Telephone Encounter (Signed)
We lowered her Wellbutrin dose to 150mg  as a trial at her physical in February.  Please touch base with patient to see how she is doing on the lower dose. If doing well on the 150mg , can refill until her next physical, though we had discussed that she can also potentially try tapering off entirely. If her moods are worse on the lower dose, can change back to 300mg  until her next physical

## 2022-04-20 NOTE — Telephone Encounter (Signed)
Left message for patient to please call back. 

## 2022-04-20 NOTE — Telephone Encounter (Signed)
Patient feels like her moods are good and she is ready to stop the wellbutrin completely. She has 10 pills left. Should she just stop of is there some way to taper off the last few?

## 2022-04-20 NOTE — Telephone Encounter (Signed)
Patient did not want #30 sent to pharmacy.

## 2022-08-04 ENCOUNTER — Ambulatory Visit
Admission: RE | Admit: 2022-08-04 | Discharge: 2022-08-04 | Disposition: A | Discharge status: Home / Self Care | Payer: Medicare Other | Source: Ambulatory Visit | Attending: Family Medicine | Admitting: Family Medicine

## 2022-08-04 DIAGNOSIS — Z780 Asymptomatic menopausal state: Secondary | ICD-10-CM

## 2022-08-04 DIAGNOSIS — Z1231 Encounter for screening mammogram for malignant neoplasm of breast: Secondary | ICD-10-CM

## 2023-02-17 ENCOUNTER — Other Ambulatory Visit: Payer: Self-pay | Admitting: Family Medicine

## 2023-02-17 DIAGNOSIS — E039 Hypothyroidism, unspecified: Secondary | ICD-10-CM

## 2023-02-17 DIAGNOSIS — I10 Essential (primary) hypertension: Secondary | ICD-10-CM

## 2023-03-03 ENCOUNTER — Ambulatory Visit: Payer: Medicare Other | Admitting: Family Medicine

## 2023-03-18 ENCOUNTER — Other Ambulatory Visit: Payer: Self-pay | Admitting: Family Medicine

## 2023-03-18 DIAGNOSIS — E039 Hypothyroidism, unspecified: Secondary | ICD-10-CM

## 2023-03-18 DIAGNOSIS — I10 Essential (primary) hypertension: Secondary | ICD-10-CM

## 2023-03-21 DIAGNOSIS — E782 Mixed hyperlipidemia: Secondary | ICD-10-CM | POA: Insufficient documentation

## 2023-03-21 NOTE — Progress Notes (Unsigned)
 No chief complaint on file.   Patient presents for med check. She was last seen for Medicare visit 1 year ago. Her Feb visit was cancelled, and she is scheduled for AWV for April.  Hypothyroidism follow-up: She has been taking her thyroid medication (switched from Synthroid to generic 75 mcg in 01/2020) along with all of her other medications, as she always has. She takes it an hour before food. She has not missed any doses.  Denies hair/skin/nail/bowel changes. Energy is good, no weight changes.  Lab Results  Component Value Date   TSH 1.080 02/19/2022     Hypertension follow-up: Blood pressures are not checked elsewhere. Denies dizziness, headaches, chest pain, muscle cramps, edema. Denies side effects of medications, compliant with taking lisinopril HCT.  BP Readings from Last 3 Encounters:  02/19/22 110/68  03/03/21 120/70  01/16/21 128/82     Vitamin D deficiency:  Vitamin D level was last checked in 01/2018 and was normal at 50.4  At that time she was taking 5000 IU.  Prior level was in 12/2015, normal at 34,when taking 1000 IU daily. She continues to take 5000 IU daily.  Mixed hyperlipidemia:  She has had some mild elevations of LDL and TG over the last few years.  She is due for recheck. Current diet:  Red meat Eggs Cheese Butter Mayo Creamy dressings/sauces/soups  Component Ref Range & Units (hover) 1 yr ago 2 yr ago 3 yr ago 5 yr ago 7 yr ago 9 yr ago 10 yr ago  Cholesterol, Total 205 High  220 High  217 High  174     Triglycerides 167 High  173 High  127 110 95 R 142 R 118 R  HDL 53 52 61 56 52 R 67 64  VLDL Cholesterol Cal 29 31 22 22      LDL Chol Calc (NIH) 123 High  137 High  134 High       Chol/HDL Ratio 3.9 4.2 CM 3.6 CM 3.1 CM 3.2 R 2.8 R 3.1 R    Recurrent depression--noted at her physical 12/2020. Previously had taken Wellbutrin after her divorce, but had been off medication for a few years.  She was restarted on Wellbutrin in 12/2020 and was doing very  well on 300 mg dose. At her physical last year, we decreased the dose to 150 mg, and discussed the potential to try off the medication entirely.  Today she reports  She has also had insomnia in the past, treated with trazodone 50 mg at bedtime. At her physical last year, she was no longer needing Trazodone, using melatonin occasionally with good results.     Obesity: She gained weight in 2022 (had been 186# in 12/2019, 209# in 12/2020). She attributed this to some stress (related to job loss, family losses, depression, stress-eating). TSH was normal at that time.   She was successful in her weight loss in the past when she cut out starches, was eating more salads, vegetables, cut out fried foods.  She was counting calories, using Weight Watchers app, doing it with her daughter.  Her daughter had also gained weight, went to a dietician to help with weight loss. Patient preferred to restart Weight Watchers, per discussion at her physical last year.   Wt Readings from Last 3 Encounters:  02/19/22 208 lb (94.3 kg)  03/03/21 206 lb 6.4 oz (93.6 kg)  01/16/21 209 lb 12.8 oz (95.2 kg)   Today she reports  [employer]?? She still avoids sweet tea, and has  only diet sodas (3/day)    PMH, PSH, SH reviewed   ROS: Denies fever, chills, URI symptoms, headaches, dizziness, shortness of breath, chest pain.   Denies nausea, vomiting, bowel changes, urinary complaints, bleeding, bruising.  Moods Insomnia weight    PHYSICAL EXAM:  There were no vitals taken for this visit.  Wt Readings from Last 3 Encounters:  02/19/22 208 lb (94.3 kg)  03/03/21 206 lb 6.4 oz (93.6 kg)  01/16/21 209 lb 12.8 oz (95.2 kg)    Well appearing, pleasant, obese female in no distress HEENT: conjunctiva and sclera are clear, EOMI.  OP is clear.  Neck: no lymphadenopathy, thyromegaly or mass, no carotid bruit Heart: regular rate and rhythm, no murmur Lungs: clear bilaterally Back: no CVA or spinal tenderness Abdomen:  soft, nontender, no mass Extremities: no edema Neuro: alert and oriented, cranial nerves intact, normal gait Psych: normal mood, affect, hygiene and grooming    ASSESSMENT/PLAN:  Flu shot (and remind to get EARLIER) Decline COVID  Did she stop wellbutrin?  If any recurrent depression, needs phq-9

## 2023-03-21 NOTE — Patient Instructions (Incomplete)
 Please get flu shots in the Fall (October). The flu shot in Sept/October 2025 will be a new vaccine for next season.  You can get this at a nurse visit at our office, or get it from a pharmacy, whichever is more convenient.  It is best to get the vaccine on board earlier in the flu season than waiting until a visit at the office.  Biotin doesn't interfere with your medication, just with the TSH blood test. If you start taking it, be sure to stop it about a week before any doctor visits (in case labs are drawn).  Please cut back on the sodium in your diet (canned foods, soy sauce, olives, pickles, lunch meats and any other processed meats--hot dogs, sausage, bacon, soups).  Regular exercise (calf raises, walking) and leg elevation will help with the swelling. Compression socks also help (put them on first thing in the morning, before there is any swelling).   Please drink more water. Try and cut back on diet soda.   If your triglycerides are elevated on your tests today, resume omega-3 fish oil.

## 2023-03-22 ENCOUNTER — Encounter: Payer: Self-pay | Admitting: Family Medicine

## 2023-03-22 ENCOUNTER — Ambulatory Visit (INDEPENDENT_AMBULATORY_CARE_PROVIDER_SITE_OTHER): Payer: Medicare Other | Admitting: Family Medicine

## 2023-03-22 VITALS — BP 128/78 | HR 60 | Ht 65.0 in | Wt 193.6 lb

## 2023-03-22 DIAGNOSIS — E782 Mixed hyperlipidemia: Secondary | ICD-10-CM

## 2023-03-22 DIAGNOSIS — F3340 Major depressive disorder, recurrent, in remission, unspecified: Secondary | ICD-10-CM

## 2023-03-22 DIAGNOSIS — E039 Hypothyroidism, unspecified: Secondary | ICD-10-CM

## 2023-03-22 DIAGNOSIS — R600 Localized edema: Secondary | ICD-10-CM

## 2023-03-22 DIAGNOSIS — Z0000 Encounter for general adult medical examination without abnormal findings: Secondary | ICD-10-CM

## 2023-03-22 DIAGNOSIS — I10 Essential (primary) hypertension: Secondary | ICD-10-CM

## 2023-03-22 DIAGNOSIS — Z780 Asymptomatic menopausal state: Secondary | ICD-10-CM

## 2023-03-22 DIAGNOSIS — L309 Dermatitis, unspecified: Secondary | ICD-10-CM

## 2023-03-22 DIAGNOSIS — G4700 Insomnia, unspecified: Secondary | ICD-10-CM

## 2023-03-22 DIAGNOSIS — Z5181 Encounter for therapeutic drug level monitoring: Secondary | ICD-10-CM

## 2023-03-22 MED ORDER — DESONIDE 0.05 % EX CREA: TOPICAL_CREAM | Freq: Two times a day (BID) | CUTANEOUS | 0 refills | Status: AC | PRN

## 2023-03-23 ENCOUNTER — Encounter: Payer: Self-pay | Admitting: Family Medicine

## 2023-03-23 LAB — CMP14+EGFR
ALT: 9 IU/L (ref 0–32)
AST: 14 IU/L (ref 0–40)
Albumin: 4.6 g/dL (ref 3.9–4.9)
Alkaline Phosphatase: 92 IU/L (ref 44–121)
BUN/Creatinine Ratio: 17 (ref 12–28)
BUN: 14 mg/dL (ref 8–27)
Bilirubin Total: 0.3 mg/dL (ref 0.0–1.2)
CO2: 25 mmol/L (ref 20–29)
Calcium: 9.9 mg/dL (ref 8.7–10.3)
Chloride: 101 mmol/L (ref 96–106)
Creatinine, Ser: 0.81 mg/dL (ref 0.57–1.00)
Globulin, Total: 2.8 g/dL (ref 1.5–4.5)
Glucose: 88 mg/dL (ref 70–99)
Potassium: 4.4 mmol/L (ref 3.5–5.2)
Sodium: 141 mmol/L (ref 134–144)
Total Protein: 7.4 g/dL (ref 6.0–8.5)
eGFR: 80 mL/min/{1.73_m2} (ref >59)

## 2023-03-23 LAB — LIPID PANEL
Chol/HDL Ratio: 3.4 ratio (ref 0.0–4.4)
Cholesterol, Total: 200 mg/dL — ABNORMAL HIGH (ref 100–199)
HDL: 58 mg/dL (ref >39)
LDL Chol Calc (NIH): 118 mg/dL — ABNORMAL HIGH (ref 0–99)
Triglycerides: 138 mg/dL (ref 0–149)
VLDL Cholesterol Cal: 24 mg/dL (ref 5–40)

## 2023-03-23 LAB — TSH: TSH: 0.973 u[IU]/mL (ref 0.450–4.500)

## 2023-03-23 MED ORDER — LEVOTHYROXINE SODIUM 75 MCG PO TABS: ORAL_TABLET | ORAL | 3 refills | Status: AC

## 2023-03-23 NOTE — Addendum Note (Signed)
 Addended by: Joselyn Arrow on: 03/23/2023 08:27 AM   Modules accepted: Orders

## 2023-04-14 ENCOUNTER — Other Ambulatory Visit: Payer: Self-pay | Admitting: Family Medicine

## 2023-04-14 DIAGNOSIS — I10 Essential (primary) hypertension: Secondary | ICD-10-CM

## 2023-05-07 IMAGING — MG MM DIGITAL SCREENING BILAT W/ TOMO AND CAD
8 series · 8 of 24 positions shown · non-contrast
Comparison: Previous exam(s).

CLINICAL DATA: Screening.

EXAM:
DIGITAL SCREENING BILATERAL MAMMOGRAM WITH TOMOSYNTHESIS AND CAD
TECHNIQUE: Bilateral screening digital craniocaudal and mediolateral oblique
mammograms were obtained. Bilateral screening digital breast
tomosynthesis was performed. The images were evaluated with
computer-aided detection.

[L CC synth-2D]
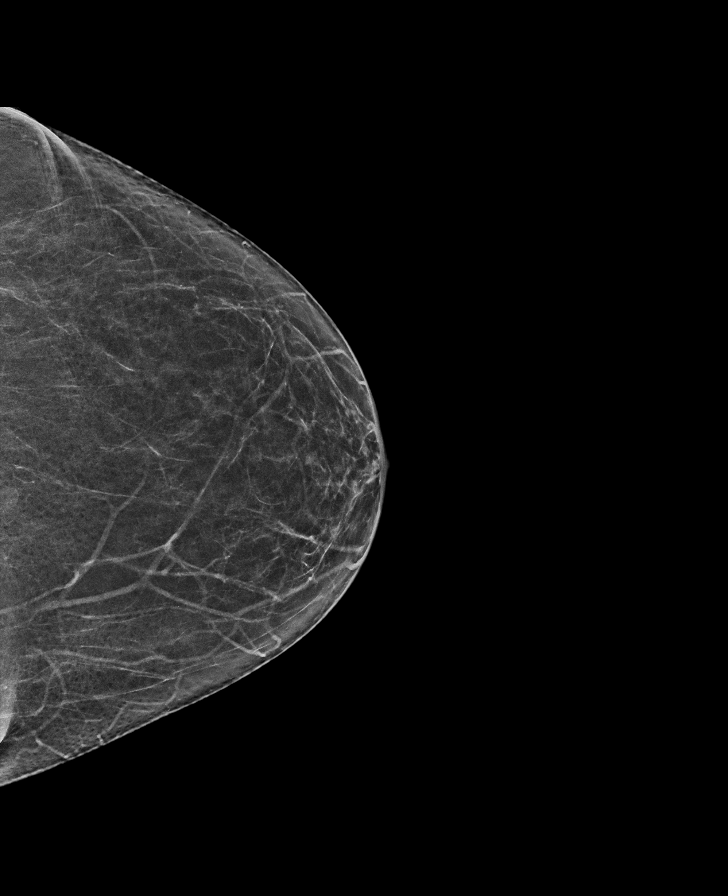

[L MLO synth-2D]
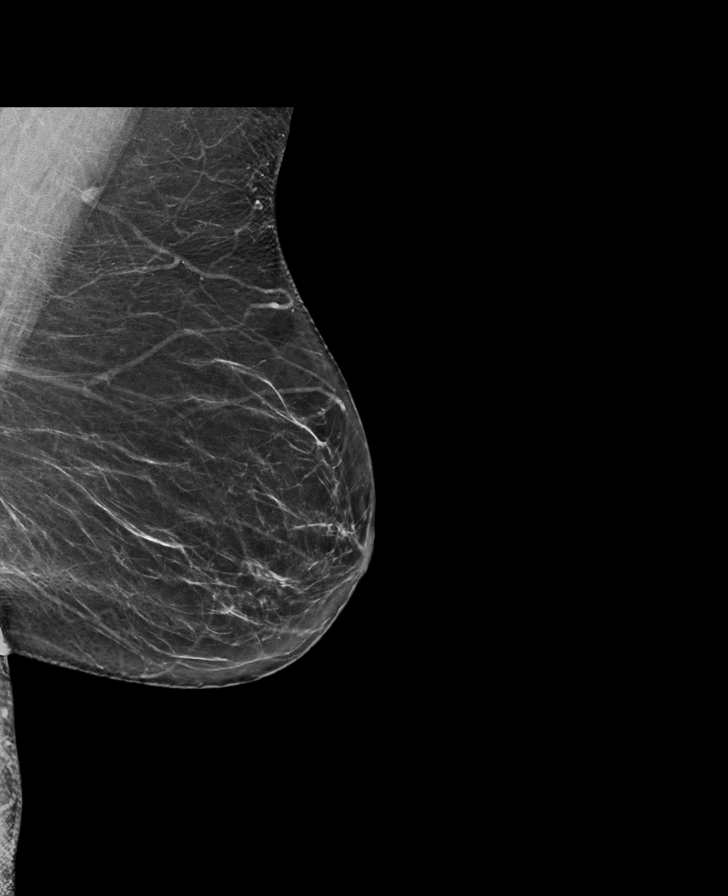

[R CC synth-2D]
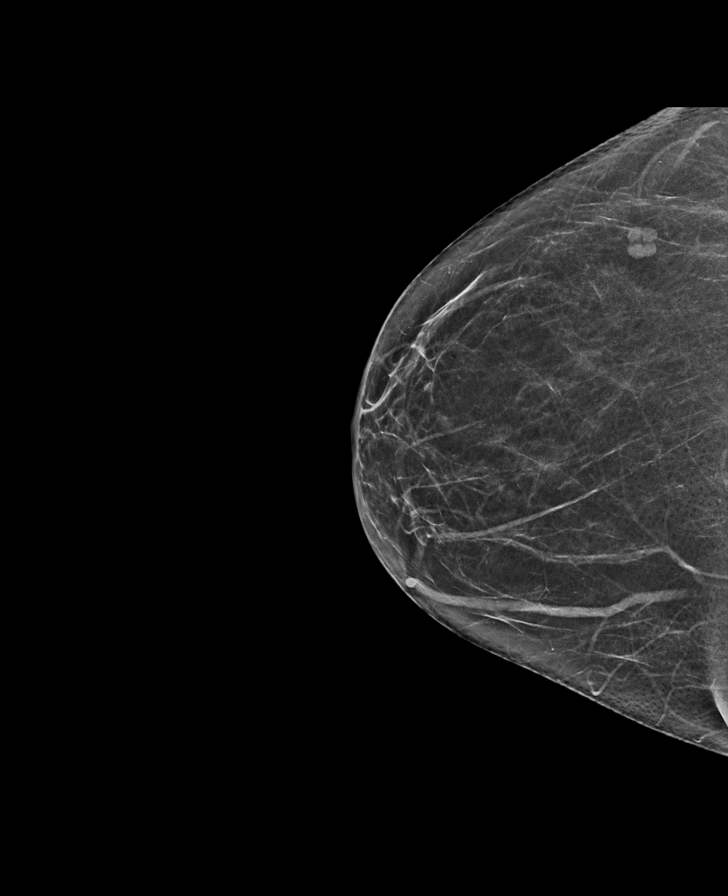

[R MLO synth-2D]
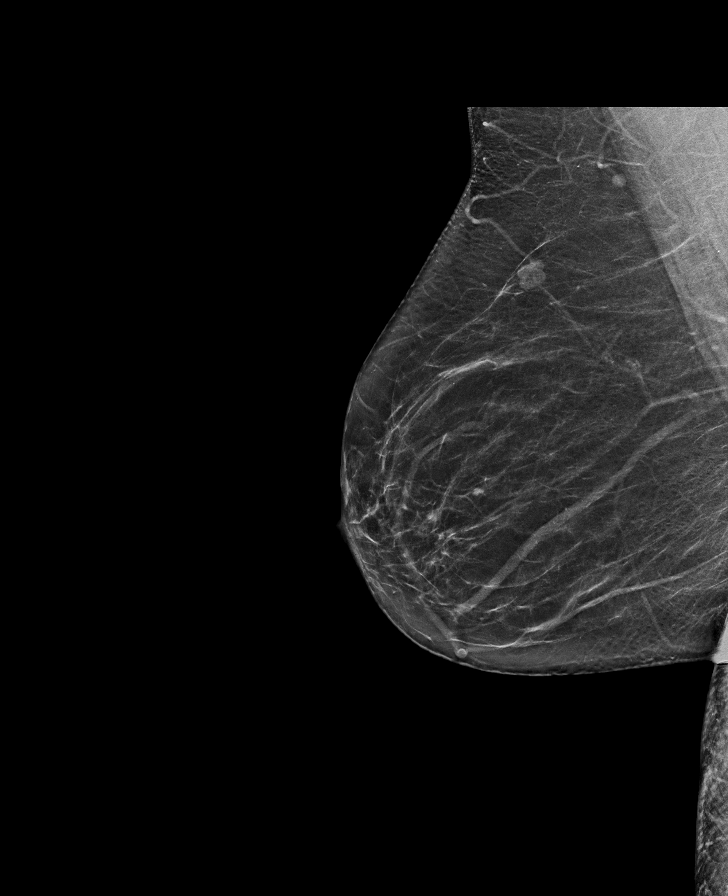

[L MLO tomo · tomo slice 36/71.0]
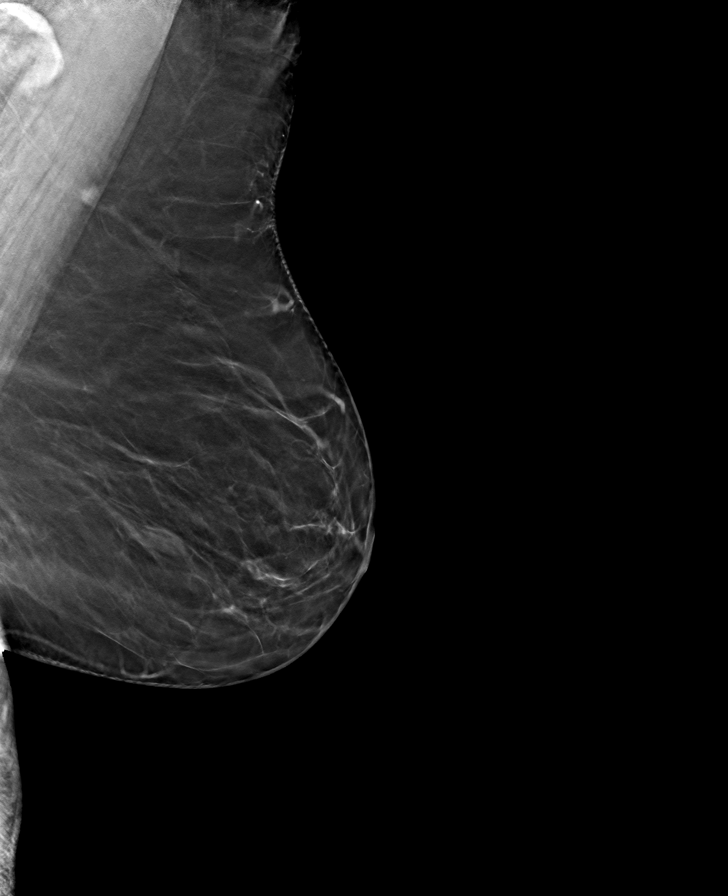

[L CC tomo · tomo slice 31/60.0]
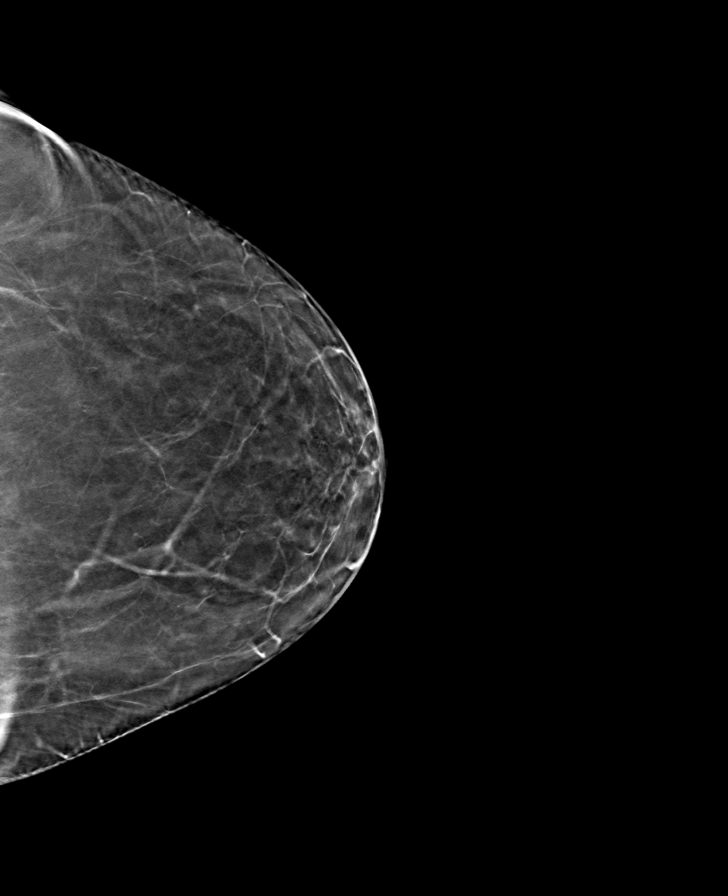

[R MLO tomo · tomo slice 35/68.0]
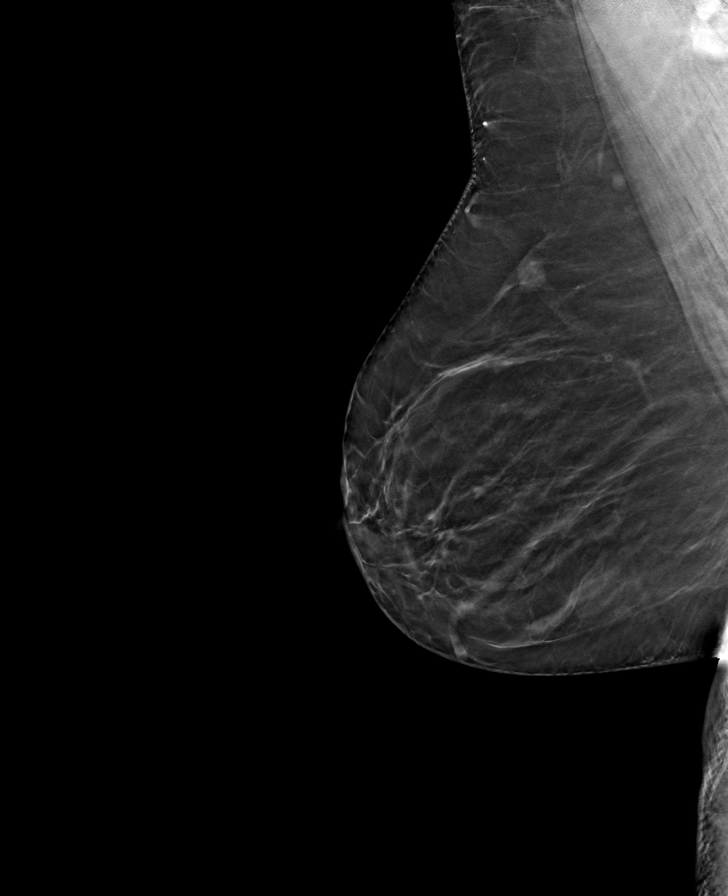

[R CC tomo · tomo slice 29/58.0]
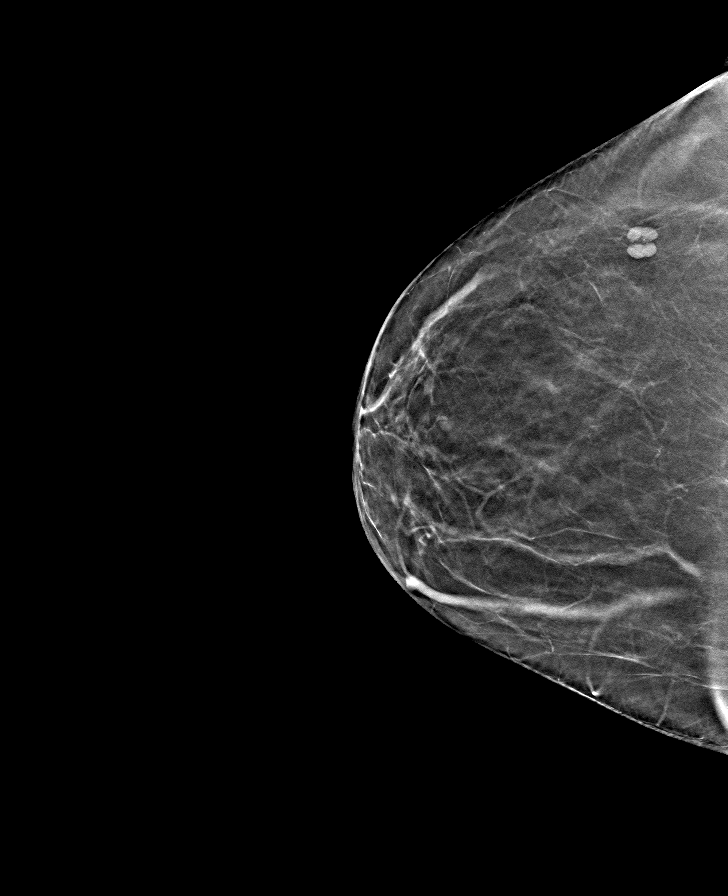

[8 of 24 positions shown; findings below may reference images not displayed]

ACR Breast Density Category b: There are scattered areas of
fibroglandular density.
FINDINGS: There are no findings suspicious for malignancy.
IMPRESSION: No mammographic evidence of malignancy. A result letter of this
screening mammogram will be mailed directly to the patient.

RECOMMENDATION:
Screening mammogram in one year. (Code:51-O-LD2)

BI-RADS CATEGORY  1: Negative.

## 2023-05-12 ENCOUNTER — Other Ambulatory Visit: Payer: Self-pay | Admitting: Family Medicine

## 2023-05-12 DIAGNOSIS — I10 Essential (primary) hypertension: Secondary | ICD-10-CM

## 2023-05-16 DIAGNOSIS — M8589 Other specified disorders of bone density and structure, multiple sites: Secondary | ICD-10-CM | POA: Insufficient documentation

## 2023-05-16 NOTE — Patient Instructions (Incomplete)
 HEALTH MAINTENANCE RECOMMENDATIONS:  It is recommended that you get at least 30 minutes of aerobic exercise at least 5 days/week (for weight loss, you may need as much as 60-90 minutes). This can be any activity that gets your heart rate up. This can be divided in 10-15 minute intervals if needed, but try and build up your endurance at least once a week.  Weight bearing exercise is also recommended twice weekly.  Eat a healthy diet with lots of vegetables, fruits and fiber.  "Colorful" foods have a lot of vitamins (ie green vegetables, tomatoes, red peppers, etc).  Limit sweet tea, regular sodas and alcoholic beverages, all of which has a lot of calories and sugar.  Up to 1 alcoholic drink daily may be beneficial for women (unless trying to lose weight, watch sugars).  Drink a lot of water .  Calcium recommendations are 1200-1500 mg daily (1500 mg for postmenopausal women or women without ovaries), and vitamin D  1000 IU daily.  This should be obtained from diet and/or supplements (vitamins), and calcium should not be taken all at once, but in divided doses.  Monthly self breast exams and yearly mammograms for women over the age of 20 is recommended.  Sunscreen of at least SPF 30 should be used on all sun-exposed parts of the skin when outside between the hours of 10 am and 4 pm (not just when at beach or pool, but even with exercise, golf, tennis, and yard work!)  Use a sunscreen that says "broad spectrum" so it covers both UVA and UVB rays, and make sure to reapply every 1-2 hours.  Remember to change the batteries in your smoke detectors when changing your clock times in the spring and fall. Carbon monoxide detectors are recommended for your home.  Use your seat belt every time you are in a car, and please drive safely and not be distracted with cell phones and texting while driving.   [name] , Thank you for taking time to come for your Medicare Wellness Visit. I appreciate your ongoing  commitment to your health goals. Please review the following plan we discussed and let me know if I can assist you in the future.   This is a list of the screening recommended for you and due dates:  Health Maintenance  Topic Date Due   COVID-19 Vaccine (4 - 2024-25 season) 09/20/2022   Mammogram  08/04/2023   Flu Shot  08/20/2023   Medicare Annual Wellness Visit  05/16/2024   Colon Cancer Screening  04/24/2026   DTaP/Tdap/Td vaccine (3 - Td or Tdap) 12/28/2028   Pneumonia Vaccine  Completed   DEXA scan (bone density measurement)  Completed   Hepatitis C Screening  Completed   Zoster (Shingles) Vaccine  Completed   HPV Vaccine  Aged Out   Meningitis B Vaccine  Aged Out   Get high dose flu shot in the Fall. Get an updated COVID booster when it becomes available in the Fall.  Try and keep an eye on your portions, and be more diligent with your tracking. Try and cook more with olive oil rather than butter, or steam your veggies.  Please call and schedule a dental cleaning.  Please bring us  copies of your Living Will and Healthcare Power of Attorney so that it can be scanned into your medical chart.  Try an antihistamine such as claritin, allegra or zyrtec when working out in the yard in order to limit your allergy symptoms. You can also wear a mask  or do a sinus rinses upon returning inside.

## 2023-05-16 NOTE — Progress Notes (Unsigned)
 No chief complaint on file.   [name] is a 68 y.o. female who presents for a Medicare Annual Wellness visit (initial).  Seen last in February for med check/labs.  Osteopenia: noted on bone density test 07/2022. T -1.8 at spine (L1-L2), -1.5 at R fem neck.   Hypothyroidism: compliant with generic levothyroxine  75 mcg, an hour before food. Denies skin/nail/bowel changes, hair is more brittle (related to treatments to lift the color out).  Denies thinning of the hair. Energy is good, no weight changes. Normal TSH recently.  Lab Results  Component Value Date   TSH 0.973 03/22/2023     Hypertension follow-up: Blood pressures are not checked elsewhere. Denies dizziness, headaches, chest pain, muscle cramps.  Rare edema (possibly related to sodium intake). Denies side effects of medications, compliant with taking lisinopril  HCT.   BP Readings from Last 3 Encounters:  03/22/23 128/78  02/19/22 110/68  03/03/21 120/70      Vitamin D  deficiency:  Vitamin D  level was last checked in 01/2018 and was normal at 50.4  At that time she was taking 5000 IU.  Prior level was in 12/2015, normal at 34,when taking 1000 IU daily. She continues to take 5000 IU daily.   Mixed hyperlipidemia:  She has had some mild elevations of LDL and TG over the last few years, improved on recent check. She continues on Weight Watchers, eating more greens, less starches, lots of chicken. Red meat 1-2x/month, 2 eggs/week, some lowfat cheese a few times/week. Cooks with some butter (green beans), mayo on chicken salad otherwise doesn't use, randch dressing with carrots and celery (balsamic vinaigrette dressing on salads when out).  Lab Results  Component Value Date   CHOL 200 (H) 03/22/2023   HDL 58 03/22/2023   LDLCALC 118 (H) 03/22/2023   TRIG 138 03/22/2023   CHOLHDL 3.4 03/22/2023      H/o depression:  Took Wellbutrin  after her divorce, had been off for a few years but restarted in 12/2020.  She tapered off and  has remained off medication for about a year and doing well.  H/o insomnia, previously treated with trazodone  50 mg at bedtime. She no longer needs this, only occasionally needs melatonin.   Obesity: She gained weight in 2022 (had been 186# in 12/2019, 209# in 12/2020). She attributed this to some stress (related to job loss, family losses, depression, stress-eating). TSH was normal at that time. She restarted Weight Watchers in 07/2022 (with her daughter), with a starting weight of 207#. She got down to 185#, regained some weight with holidays and winter.  She restarted WW in January 2025.   She is walking with her daughter at [facility] park, 4 miles 3x/week. No weight-bearing exercise.   Wt Readings from Last 3 Encounters:  03/22/23 193 lb 9.6 oz (87.8 kg)  02/19/22 208 lb (94.3 kg)  03/03/21 206 lb 6.4 oz (93.6 kg)     Immunization History  Administered Date(s) Administered   Influenza Split 10/12/2011   Influenza,inj,Quad PF,6+ Mos 01/09/2013, 02/07/2014, 09/03/2014, 01/08/2016, 12/09/2016, 02/16/2018, 12/29/2018, 01/11/2020, 01/16/2021, 02/19/2022   PFIZER(Purple Top)SARS-COV-2 Vaccination 01/11/2020   PNEUMOCOCCAL CONJUGATE-20 03/12/2022   Pfizer Covid-19 Vaccine Bivalent Booster 71yrs & up 01/21/2021   Pfizer(Comirnaty)Fall Seasonal Vaccine 12 years and older 02/19/2022   Tdap 09/19/2008, 12/29/2018   Zoster Recombinant(Shingrix ) 03/26/2021, 05/26/2021   Last Pap smear: 2009; s/p hysterectomy Last mammogram: 07/2022 Last colonoscopy: 04/2021, 1 tubular adenoma, small internal hemorrhoids.  Recheck 5 years (Dr. Tova Fresh) Last DEXA: 07/2022 T -1.8  at spine (L1-L2), -1.5 at R fem neck. Dentist: many years ago; she just got dental insurance and plans to go. *** UPDATE Ophtho: once yearly, wears contacts Exercise:  walking with her daughter at [facility] park, 4 miles 3x/week. No weight-bearing exercise. ***    Lipids: Lab Results  Component Value Date   CHOL 200 (H) 03/22/2023    HDL 58 03/22/2023   LDLCALC 118 (H) 03/22/2023   TRIG 138 03/22/2023   CHOLHDL 3.4 03/22/2023   Patient Care Team: Roosvelt Colla, MD as PCP - General (Family Medicine) Ortho: Dr. Leighton Punches GI: Dr. Tova Fresh Ophtho: [facility] Dentist will be [facility] family dentistry  Depression Screening:     02/19/2022    2:53 PM 03/03/2021   10:48 AM 01/16/2021    1:46 PM 01/11/2020    8:41 AM 12/29/2018    2:07 PM  Depression screen PHQ 2/9  Decreased Interest 0 1 2 0 0  Down, Depressed, Hopeless 0 1 1 0 0  PHQ - 2 Score 0 2 3 0 0  Altered sleeping 0 1 3    Tired, decreased energy 0 1 1    Change in appetite 0 1 2    Feeling bad or failure about yourself  0 1 0    Trouble concentrating 0 1 0    Moving slowly or fidgety/restless 0 1 0    Suicidal thoughts 0 0 0    PHQ-9 Score 0 8 9    Difficult doing work/chores Not difficult at all Not difficult at all Not difficult at all      Falls screen:     02/19/2022    2:53 PM 03/03/2021   10:48 AM  Fall Risk   Falls in the past year? 0 0  Number falls in past yr: 0 0  Injury with Fall? 0 0  Risk for fall due to : No Fall Risks No Fall Risks  Follow up Falls evaluation completed Falls evaluation completed     Functional Status Survey:         End of Life Discussion:  Patient does not have a living will and medical power of attorney    PMH, PSH, SH, FH were reviewed and updated      ROS:  The patient denies anorexia, fever, headaches,  vision changes, decreased hearing, ear pain, sore throat, breast concerns, chest pain, palpitations, dizziness, syncope, dyspnea on exertion, cough, swelling, nausea, vomiting, diarrhea, constipation, abdominal pain, melena, hematochezia, indigestion/heartburn, hematuria, incontinence, dysuria, vaginal bleeding, discharge, odor or itch, genital lesions, numbness, tingling, weakness, tremor, suspicious skin lesions, depression, anxiety, abnormal bleeding/bruising, or enlarged lymph nodes. Denies any  recurrent depression.  Rare insomnia.  Weight  ***    PHYSICAL EXAM:  There were no vitals taken for this visit.  Wt Readings from Last 3 Encounters:  03/22/23 193 lb 9.6 oz (87.8 kg)  02/19/22 208 lb (94.3 kg)  03/03/21 206 lb 6.4 oz (93.6 kg)      General Appearance:    Alert, cooperative, no distress, appears stated age   Head:     Normocephalic, without obvious abnormality, atraumatic.   Eyes:     PERRL, conjunctiva/corneas clear, EOM's intact, fundi benign   Ears:     Normal TM's and external ear canals   Nose:    Normal, no drainage  Throat:    OP is clear, no lesions.  Neck:    Supple, no lymphadenopathy;  thyroid :  no enlargement/ tenderness/nodules; no carotid bruit or JVD.  Back:     Spine nontender, no curvature, ROM normal, no CVA tenderness   Lungs:      Clear to auscultation bilaterally without wheezes, rales or ronchi; respirations unlabored   Chest Wall:     No tenderness or deformity.    Heart:     Regular rate and rhythm, S1 and S2 normal, no murmur, rub or gallop.   Breast Exam:     No tenderness, masses, or nipple discharge or inversion. No axillary lymphadenopathy   Abdomen:      Soft, non-tender, nondistended, normoactive bowel sounds, no masses, no hepatosplenomegaly   Genitalia:     Normal external genitalia without lesions. BUS and vagina normal; uterus is surgically absent; adnexa not enlarged, nontender, no masses.  Pap not performed  ***  Rectal:     Normal tone, no masses or tenderness; guaiac negative stool  ***  Extremities:    No clubbing, cyanosis or edema.  Pulses:    2+ and symmetric all extremities   Skin:    Skin color, texture, turgor normal, no rashes or lesions.   Lymph nodes:    Cervical, supraclavicular, inguinal and axillary nodes normal   Neurologic:    Normal strength, sensation and gait; reflexes 2+ and symmetric throughout               Psych:   Normal mood, affect, hygiene and grooming   normal urine dip  EKG NSR, rate 87.  Poss  LAE.  Compared to EKG 02/2014, unchanged.     ASSESSMENT/PLAN:   Skip pelvic/rectal if not having any symptoms (medicare pays for every 2 years, done last year) Any flu shot or COVID booster in fall 2024?  Didn't discuss her DEXA results at med check--osteopenia   Discussed monthly self breast exams and yearly mammograms; at least 30 minutes of aerobic activity at least 5 days/week, weight-bearing exercise at least 2x/week (upper and lower body); proper sunscreen use reviewed; healthy diet, including goals of calcium and vitamin D  intake and alcohol recommendations (less than or equal to 1 drink/day) reviewed; regular seatbelt use; changing batteries in smoke detectors.  Immunization recommendations discussed--continue yearly flu shots. COVID *** Colonoscopy recommendations reviewed, UTD.  Due again 04/2026.  Routine dental cleanings recommended.    Medicare Attestation I have personally reviewed: The patient's medical and social history Their use of alcohol, tobacco or illicit drugs Their current medications and supplements The patient's functional ability including ADLs,fall risks, home safety risks, cognitive, and hearing and visual impairment Diet and physical activities Evidence for depression or mood disorders  The patient's weight, height, BMI have been recorded in the chart.  I have made referrals, counseling, and provided education to the patient based on review of the above and I have provided the patient with a written personalized care plan for preventive services.     Paulett Boros, MD

## 2023-05-17 ENCOUNTER — Ambulatory Visit (INDEPENDENT_AMBULATORY_CARE_PROVIDER_SITE_OTHER): Admitting: Family Medicine

## 2023-05-17 ENCOUNTER — Encounter: Payer: Self-pay | Admitting: Family Medicine

## 2023-05-17 ENCOUNTER — Ambulatory Visit: Payer: Medicare Other | Admitting: Family Medicine

## 2023-05-17 VITALS — BP 120/70 | HR 60 | Temp 97.1°F | Ht 64.5 in | Wt 197.8 lb

## 2023-05-17 DIAGNOSIS — E6609 Other obesity due to excess calories: Secondary | ICD-10-CM

## 2023-05-17 DIAGNOSIS — E782 Mixed hyperlipidemia: Secondary | ICD-10-CM

## 2023-05-17 DIAGNOSIS — Z0000 Encounter for general adult medical examination without abnormal findings: Secondary | ICD-10-CM

## 2023-05-17 DIAGNOSIS — I10 Essential (primary) hypertension: Secondary | ICD-10-CM

## 2023-05-17 DIAGNOSIS — F3340 Major depressive disorder, recurrent, in remission, unspecified: Secondary | ICD-10-CM | POA: Diagnosis not present

## 2023-05-17 DIAGNOSIS — E039 Hypothyroidism, unspecified: Secondary | ICD-10-CM | POA: Diagnosis not present

## 2023-05-17 DIAGNOSIS — M8589 Other specified disorders of bone density and structure, multiple sites: Secondary | ICD-10-CM

## 2023-05-17 DIAGNOSIS — Z6833 Body mass index (BMI) 33.0-33.9, adult: Secondary | ICD-10-CM

## 2023-05-17 DIAGNOSIS — K644 Residual hemorrhoidal skin tags: Secondary | ICD-10-CM

## 2023-05-17 DIAGNOSIS — E66811 Obesity, class 1: Secondary | ICD-10-CM

## 2023-05-17 MED ORDER — LISINOPRIL-HYDROCHLOROTHIAZIDE 10-12.5 MG PO TABS: 1.0000 | ORAL_TABLET | Freq: Every day | ORAL | 3 refills | Status: AC

## 2023-05-17 MED ORDER — HYDROCORTISONE (PERIANAL) 2.5 % EX CREA: 1.0000 | TOPICAL_CREAM | Freq: Three times a day (TID) | CUTANEOUS | 1 refills | Status: AC | PRN

## 2024-06-15 ENCOUNTER — Encounter: Payer: Self-pay | Admitting: Family Medicine
